# Patient Record
Sex: Male | Born: 1960 | Race: White | Hispanic: No | Marital: Married | Smoking: Current every day smoker
Health system: Southern US, Community
[De-identification: ages and names within clinical notes are randomized; demographics above are authoritative.]

## PROBLEM LIST (undated history)

## (undated) DIAGNOSIS — I2510 Atherosclerotic heart disease of native coronary artery without angina pectoris: Secondary | ICD-10-CM

## (undated) DIAGNOSIS — G4730 Sleep apnea, unspecified: Secondary | ICD-10-CM

## (undated) DIAGNOSIS — E785 Hyperlipidemia, unspecified: Secondary | ICD-10-CM

## (undated) DIAGNOSIS — I639 Cerebral infarction, unspecified: Secondary | ICD-10-CM

## (undated) DIAGNOSIS — I10 Essential (primary) hypertension: Secondary | ICD-10-CM

## (undated) DIAGNOSIS — I213 ST elevation (STEMI) myocardial infarction of unspecified site: Secondary | ICD-10-CM

## (undated) DIAGNOSIS — I4891 Unspecified atrial fibrillation: Secondary | ICD-10-CM

## (undated) DIAGNOSIS — I82409 Acute embolism and thrombosis of unspecified deep veins of unspecified lower extremity: Secondary | ICD-10-CM

## (undated) HISTORY — DX: Atherosclerotic heart disease of native coronary artery without angina pectoris: I25.10

## (undated) HISTORY — DX: Unspecified atrial fibrillation: I48.91

## (undated) HISTORY — PX: CORONARY ARTERY BYPASS GRAFT: SHX141

## (undated) HISTORY — PX: NO PAST SURGERIES: SHX2092

---

## 2005-03-08 ENCOUNTER — Ambulatory Visit: Payer: Self-pay | Admitting: Internal Medicine

## 2005-03-10 ENCOUNTER — Observation Stay: Payer: Self-pay | Admitting: Internal Medicine

## 2005-03-17 ENCOUNTER — Ambulatory Visit: Payer: Self-pay | Admitting: Internal Medicine

## 2005-03-23 ENCOUNTER — Ambulatory Visit: Payer: Self-pay | Admitting: Internal Medicine

## 2005-04-13 ENCOUNTER — Ambulatory Visit: Payer: Self-pay | Admitting: Internal Medicine

## 2005-04-22 ENCOUNTER — Ambulatory Visit: Payer: Self-pay | Admitting: Internal Medicine

## 2005-05-23 ENCOUNTER — Ambulatory Visit: Payer: Self-pay | Admitting: Internal Medicine

## 2005-06-22 ENCOUNTER — Ambulatory Visit: Payer: Self-pay | Admitting: Internal Medicine

## 2005-07-24 ENCOUNTER — Ambulatory Visit: Payer: Self-pay | Admitting: Internal Medicine

## 2005-08-23 ENCOUNTER — Ambulatory Visit: Payer: Self-pay | Admitting: Internal Medicine

## 2005-09-20 ENCOUNTER — Ambulatory Visit: Payer: Self-pay | Admitting: Internal Medicine

## 2005-10-21 ENCOUNTER — Ambulatory Visit: Payer: Self-pay | Admitting: Internal Medicine

## 2005-11-20 ENCOUNTER — Ambulatory Visit: Payer: Self-pay | Admitting: Internal Medicine

## 2005-12-21 ENCOUNTER — Ambulatory Visit: Payer: Self-pay | Admitting: Internal Medicine

## 2006-01-20 ENCOUNTER — Ambulatory Visit: Payer: Self-pay | Admitting: Internal Medicine

## 2006-02-20 ENCOUNTER — Ambulatory Visit: Payer: Self-pay | Admitting: Internal Medicine

## 2006-03-23 ENCOUNTER — Ambulatory Visit: Payer: Self-pay | Admitting: Internal Medicine

## 2006-04-22 ENCOUNTER — Ambulatory Visit: Payer: Self-pay | Admitting: Internal Medicine

## 2006-05-23 ENCOUNTER — Ambulatory Visit: Payer: Self-pay | Admitting: Internal Medicine

## 2006-06-22 ENCOUNTER — Ambulatory Visit: Payer: Self-pay | Admitting: Internal Medicine

## 2006-07-23 ENCOUNTER — Ambulatory Visit: Payer: Self-pay | Admitting: Internal Medicine

## 2006-08-23 ENCOUNTER — Ambulatory Visit: Payer: Self-pay | Admitting: Internal Medicine

## 2006-09-18 IMAGING — CR DG CHEST 2V
1 series · 2 of 2 positions shown · non-contrast
Comparison: none

REASON FOR EXAM: DVT, smoker
COMMENTS:

[Series 1121: postero_anterior · 0.11mm/px · 2 of 2 slices shown]
[im 1/2]
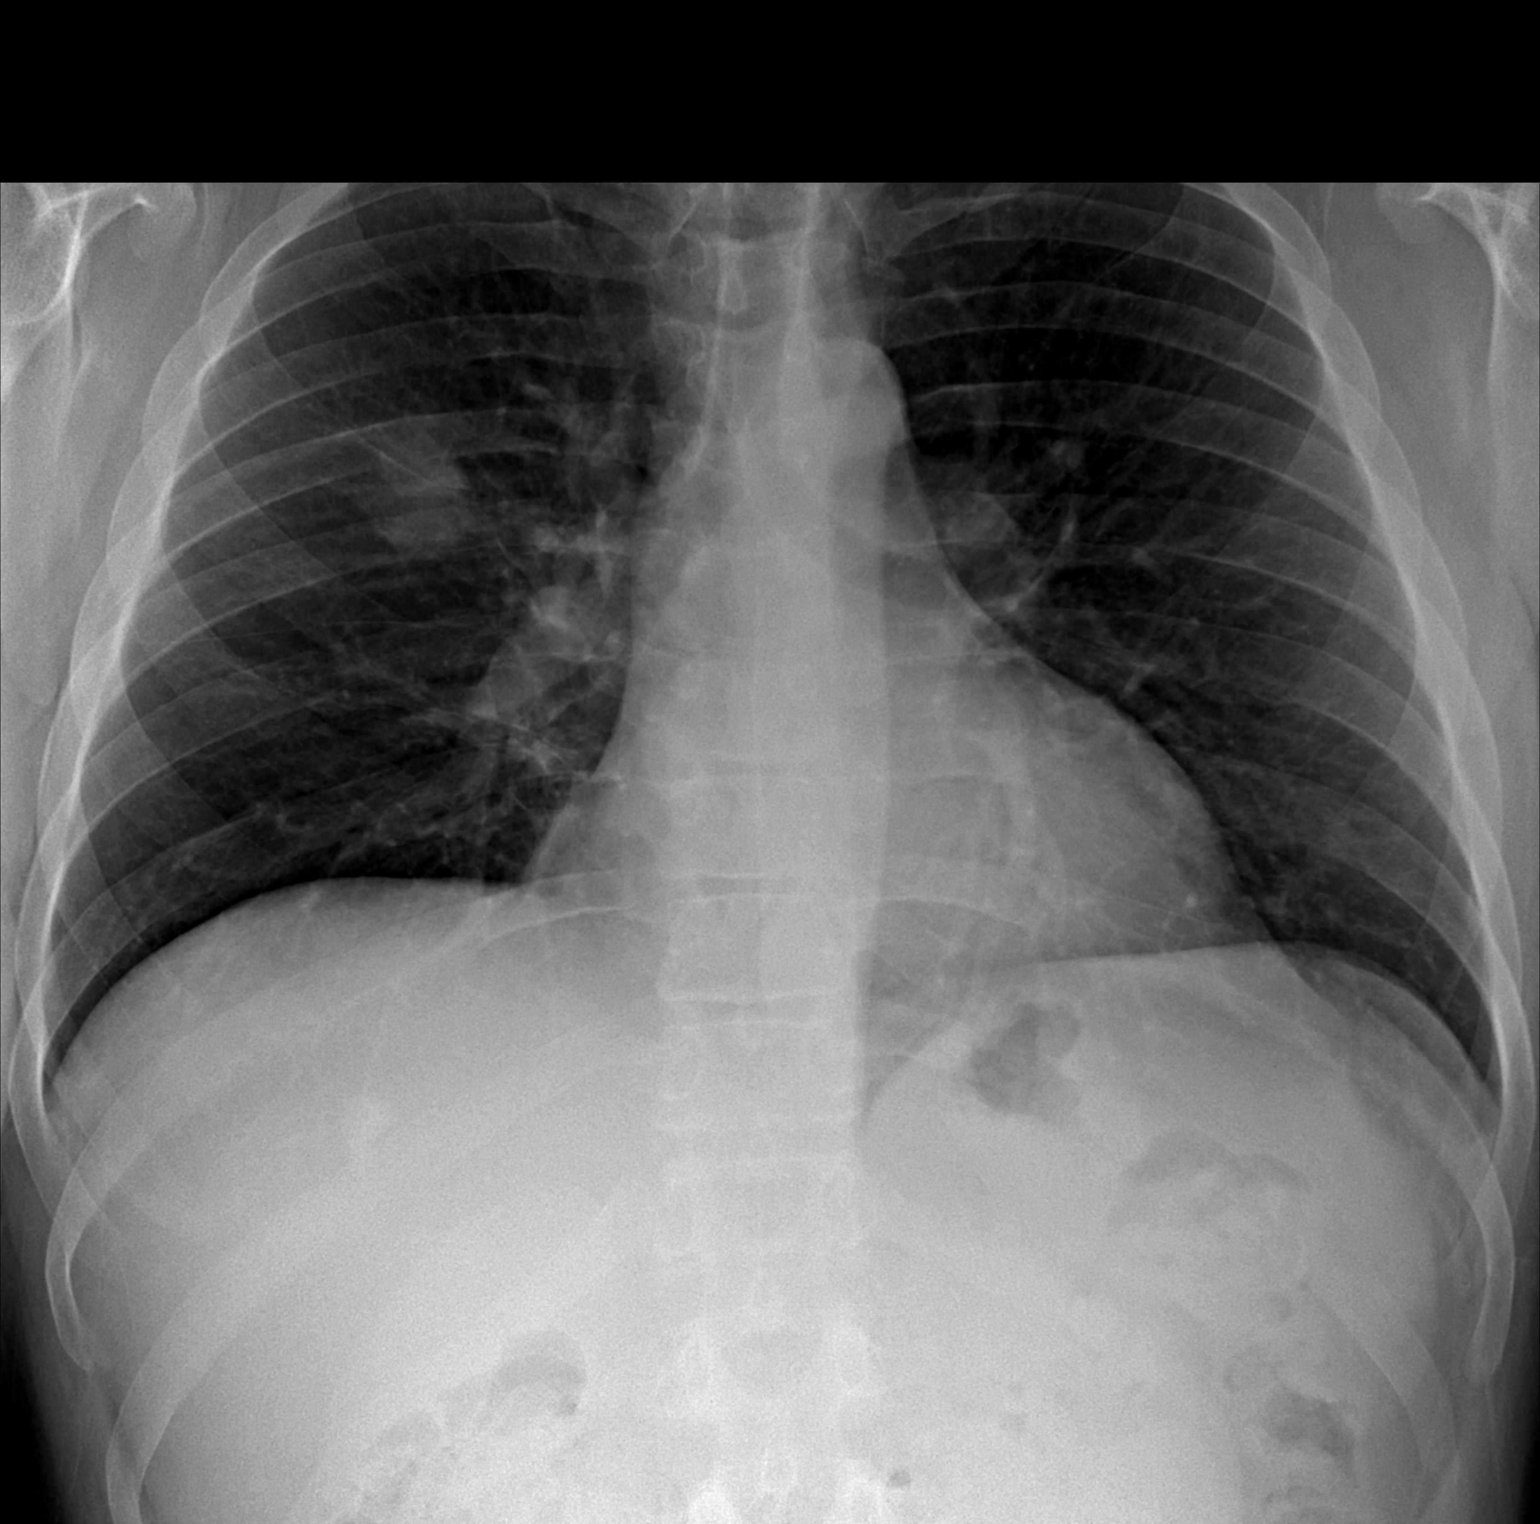
[im 2/2]
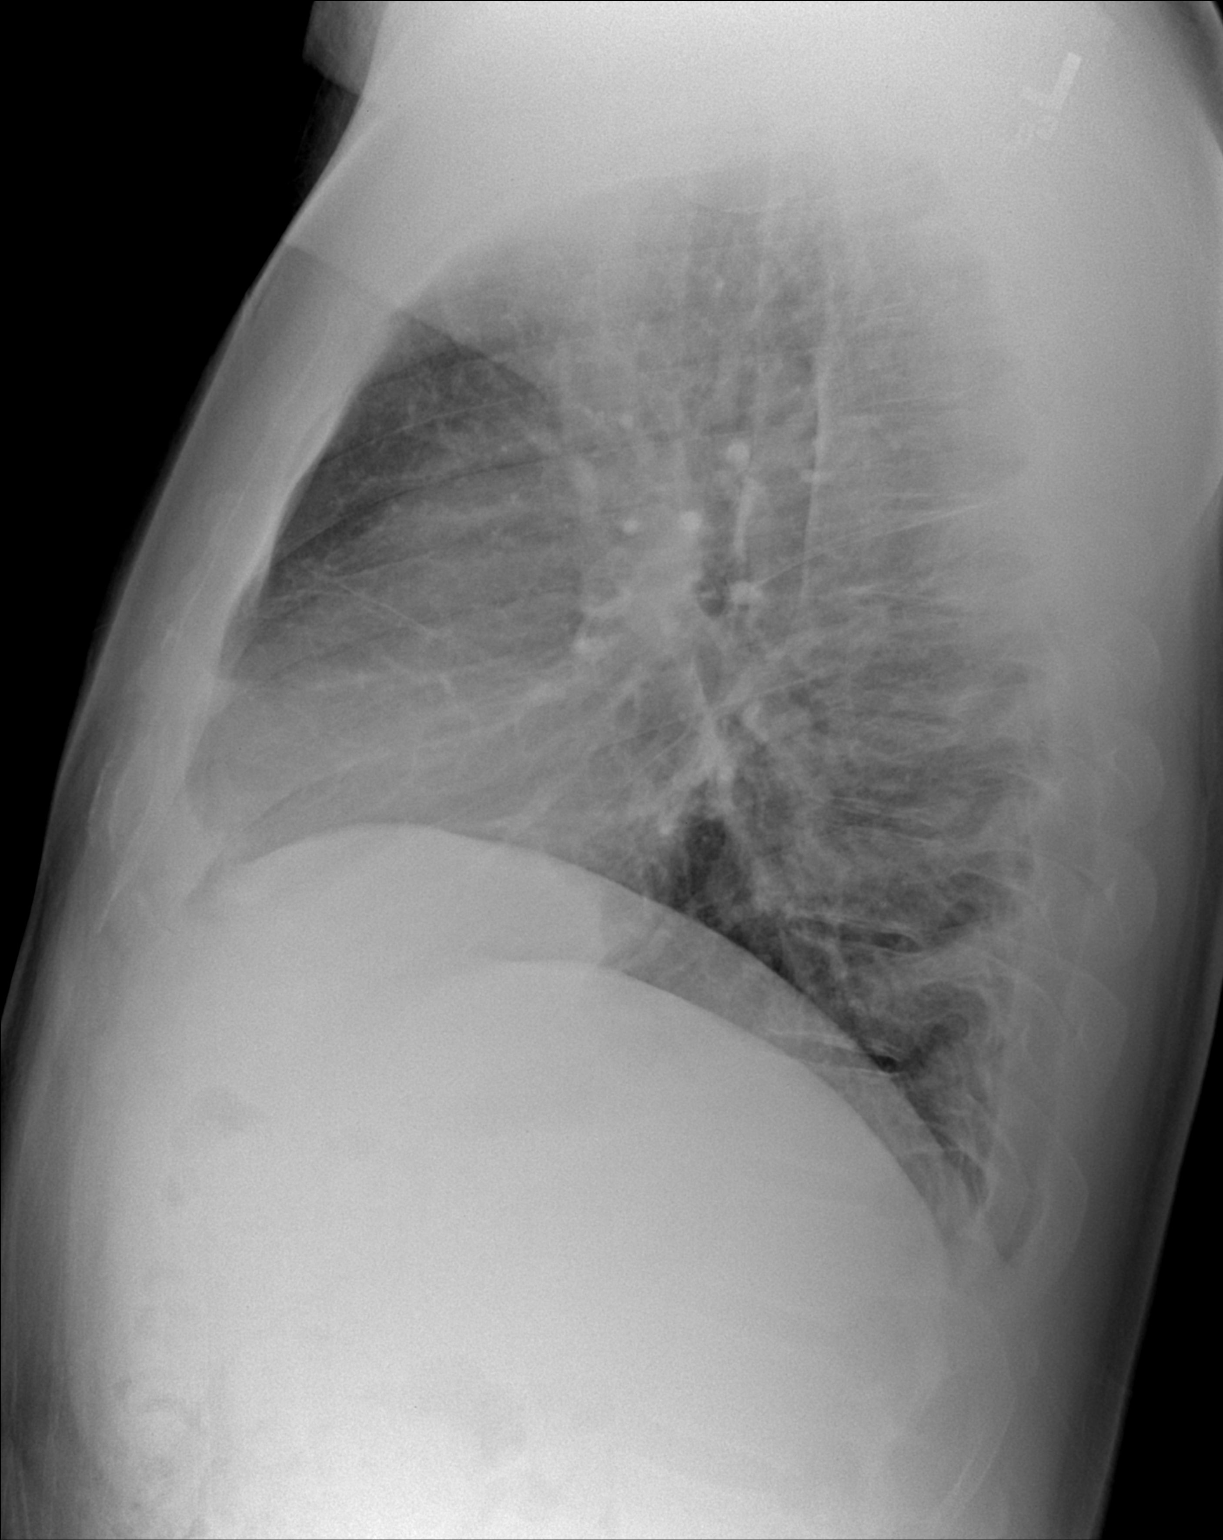

[2 of 2 positions shown; findings below may reference images not displayed]

PROCEDURE:     DXR - DXR CHEST PA (OR AP) AND LATERAL  - March 12, 2005 [DATE]

RESULT:     There is an area of abnormal density in the RIGHT midlung.  This
is difficult to demonstrate clearly on the lateral film but it may lie
posteriorly.  The lungs are mildly hypoinflated.  The heart is not enlarged.
 No acute bony abnormality is seen.
IMPRESSION: There is abnormal soft tissue density in the RIGHT midlung that appears to
reflect an area of parenchymal consolidation.  A repeat PA and lateral chest
x-ray with deep inspiration would be of value.  Chest CT could be performed
as well.

## 2006-09-18 IMAGING — US ABDOMEN ULTRASOUND
1 series · 14 of 25 positions shown · non-contrast
Comparison: none

REASON FOR EXAM: Polycythemia
COMMENTS:

[Series 1: abdomen ultrasound · 0.35mm/px · 14 of 69 slices shown]
[im 1/69]
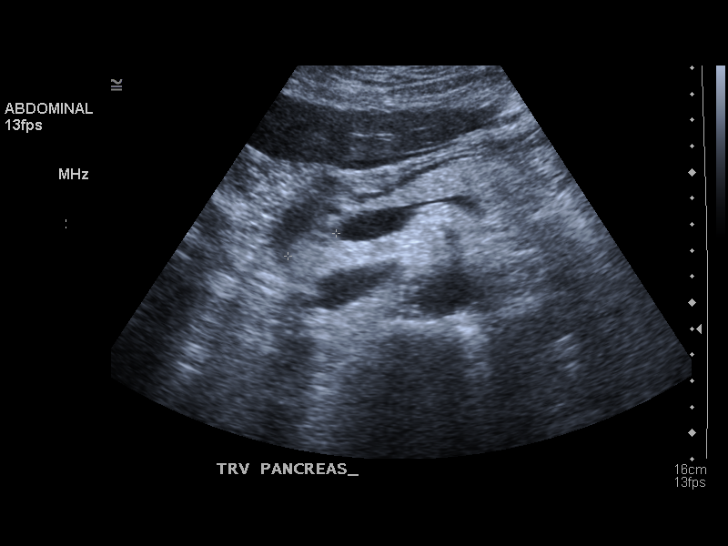
[im 6/69]
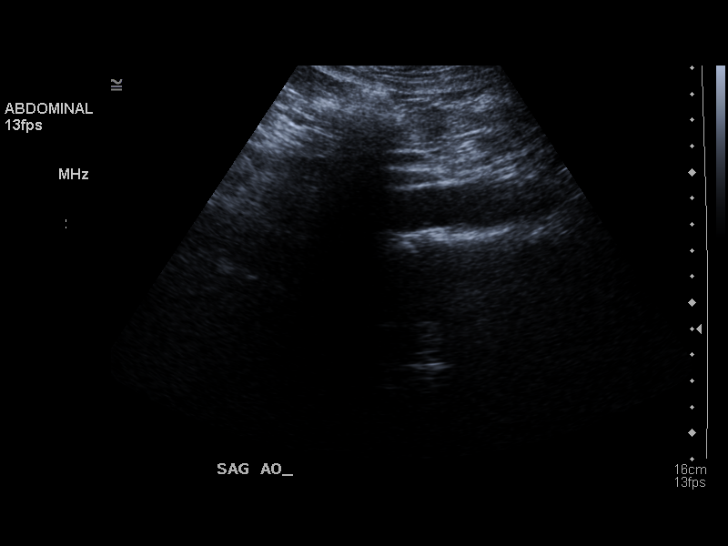
[im 12/69]
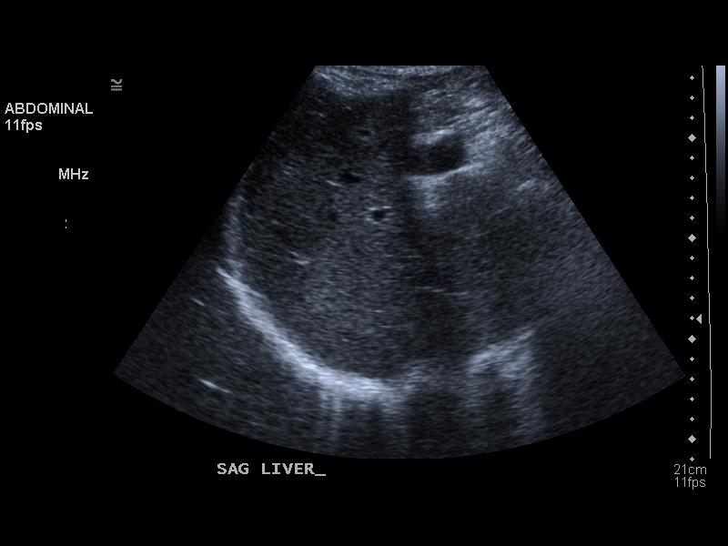
[im 18/69]
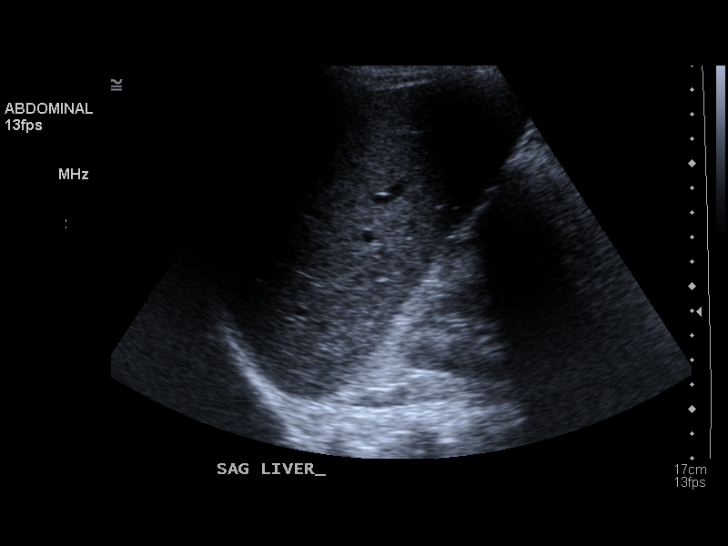
[im 23/69]
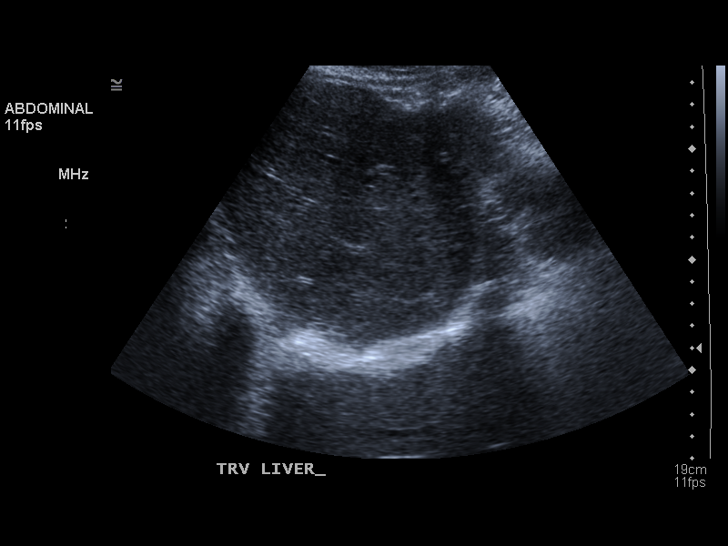
[im 26/69]
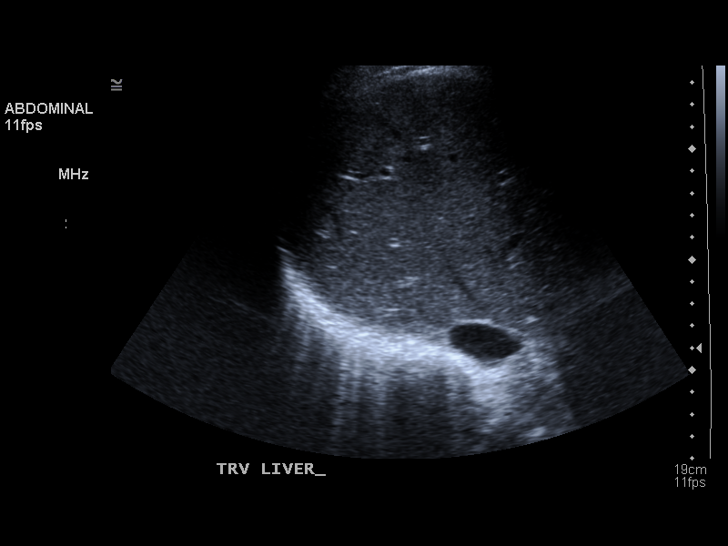
[im 32/69]
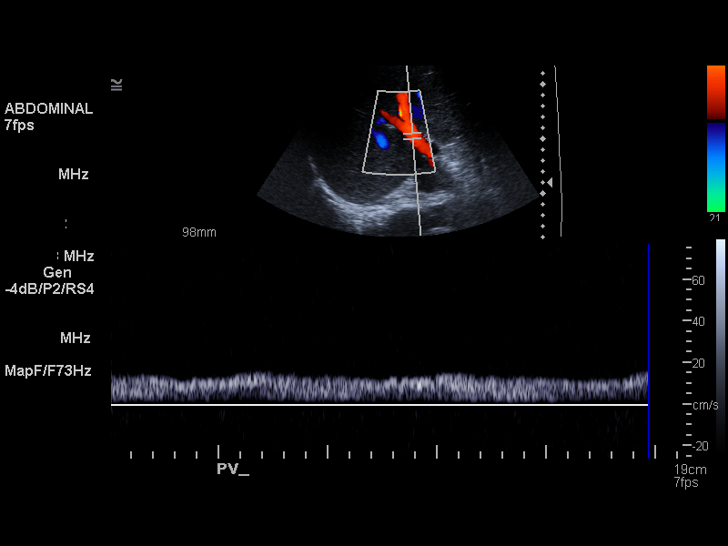
[im 37/69]
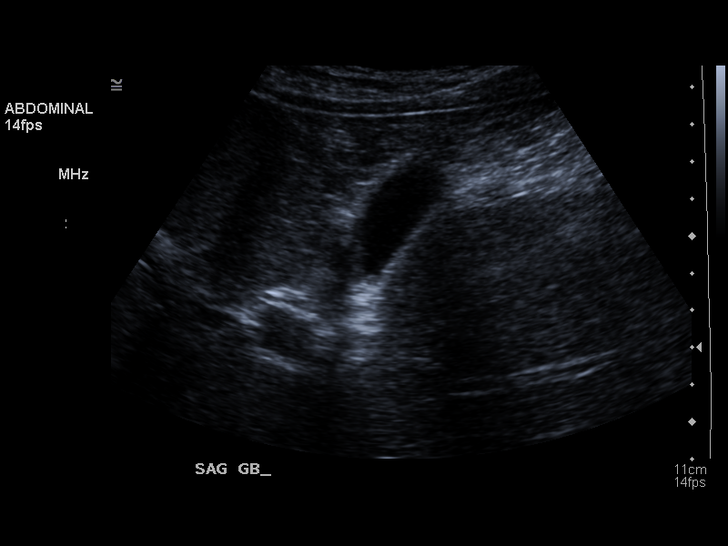
[im 43/69]
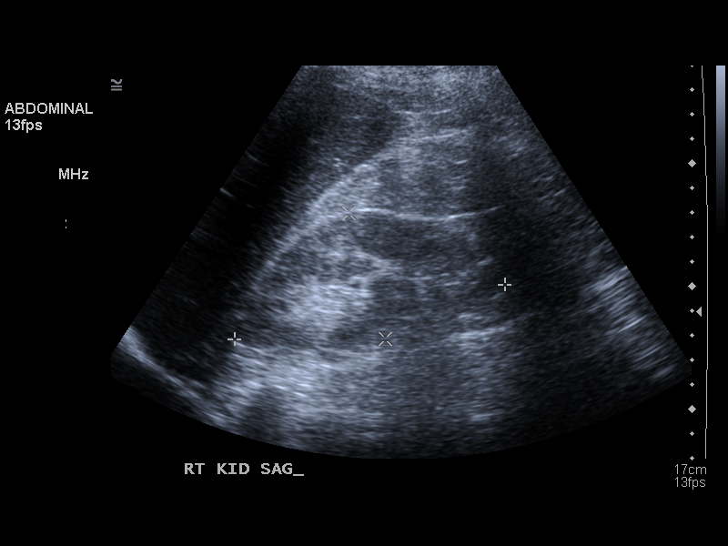
[im 46/69]
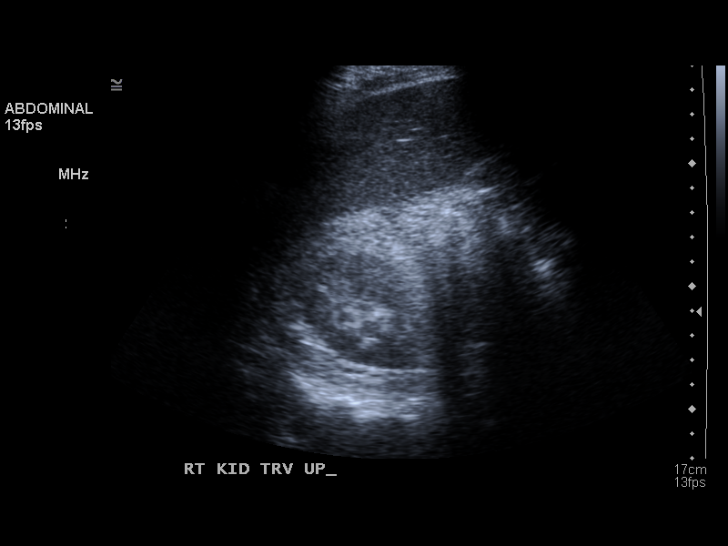
[im 52/69]
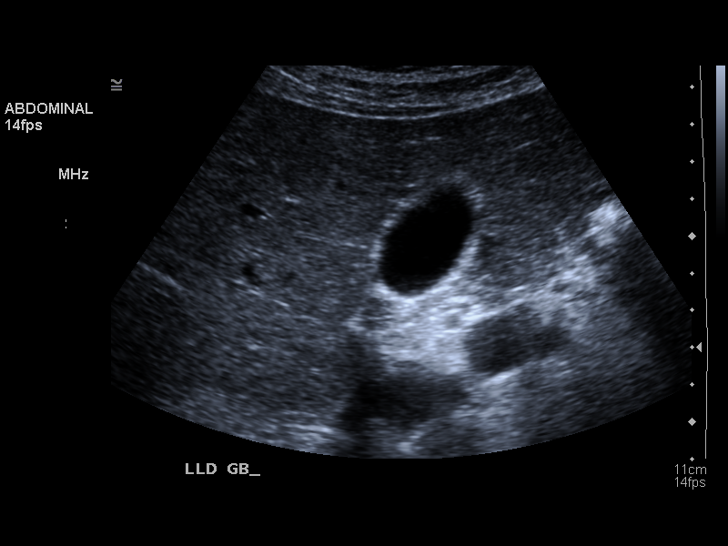
[im 57/69]
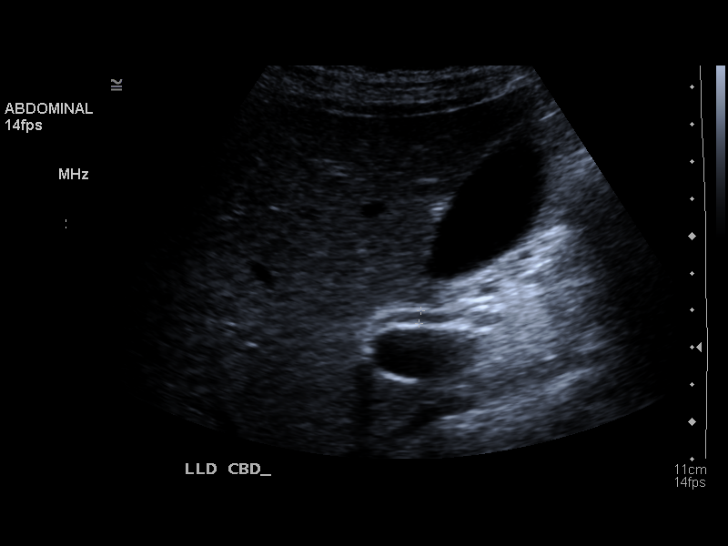
[im 63/69]
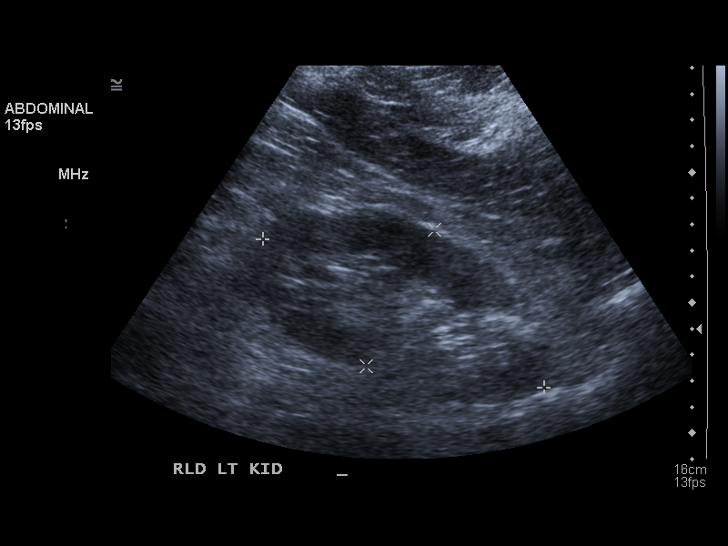
[im 69/69]
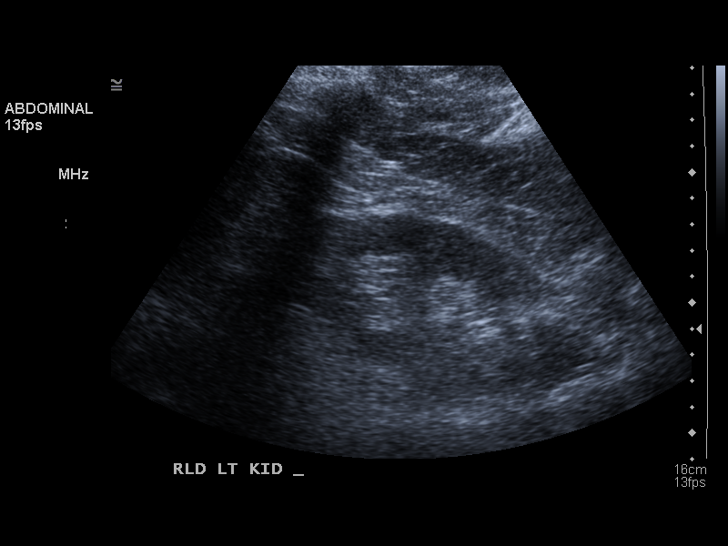

[14 of 25 positions shown; findings below may reference images not displayed]

PROCEDURE:     US  - US ABDOMEN GENERAL SURVEY  - March 12, 2005 [DATE]

RESULT:     The patient has unexplained polycythemia.  The liver exhibits
normal echotexture with no mass or ductal dilation.  The portal venous flow
is normal in direction.  The pancreas exhibits no mass or ductal dilation or
evidence of pseudocyst.  The gallbladder is adequately distended.  There is
no sonographic Murphy's sign.  A tiny adherent echogenic focus suggests a
gallbladder polyp or noncalcified non-shadowing stone.  The common bile duct
is normal at 3.5 mm in diameter.  The spleen is normal in echotexture and
measures approximately 10.2 cm in greatest dimension.  There is no evidence
of ascites.  Survey views of the abdominal aorta are normal.  The kidneys
exhibit normal size and echotexture with no evidence of obstruction.
IMPRESSION: There is a subtle echogenic focus adherent to the gallbladder wall that does
not shadow that may reflect a noncalcified stone or polyp.  Otherwise the
gallbladder appears normal.

I see no acute abnormality elsewhere within the abdomen.

## 2006-09-21 ENCOUNTER — Ambulatory Visit: Payer: Self-pay | Admitting: Internal Medicine

## 2006-10-22 ENCOUNTER — Ambulatory Visit: Payer: Self-pay | Admitting: Internal Medicine

## 2006-11-21 ENCOUNTER — Ambulatory Visit: Payer: Self-pay | Admitting: Internal Medicine

## 2006-12-22 ENCOUNTER — Ambulatory Visit: Payer: Self-pay | Admitting: Internal Medicine

## 2007-01-21 ENCOUNTER — Ambulatory Visit: Payer: Self-pay | Admitting: Internal Medicine

## 2007-02-21 ENCOUNTER — Ambulatory Visit: Payer: Self-pay | Admitting: Internal Medicine

## 2007-03-24 ENCOUNTER — Ambulatory Visit: Payer: Self-pay | Admitting: Internal Medicine

## 2007-04-23 ENCOUNTER — Ambulatory Visit: Payer: Self-pay | Admitting: Internal Medicine

## 2007-05-24 ENCOUNTER — Ambulatory Visit: Payer: Self-pay | Admitting: Internal Medicine

## 2007-06-23 ENCOUNTER — Ambulatory Visit: Payer: Self-pay | Admitting: Internal Medicine

## 2007-07-24 ENCOUNTER — Ambulatory Visit: Payer: Self-pay | Admitting: Internal Medicine

## 2007-08-24 ENCOUNTER — Ambulatory Visit: Payer: Self-pay | Admitting: Internal Medicine

## 2007-09-21 ENCOUNTER — Ambulatory Visit: Payer: Self-pay | Admitting: Internal Medicine

## 2007-10-22 ENCOUNTER — Ambulatory Visit: Payer: Self-pay | Admitting: Internal Medicine

## 2007-10-28 ENCOUNTER — Ambulatory Visit: Payer: Self-pay | Admitting: Internal Medicine

## 2007-11-21 ENCOUNTER — Ambulatory Visit: Payer: Self-pay | Admitting: Internal Medicine

## 2007-12-23 ENCOUNTER — Ambulatory Visit: Payer: Self-pay | Admitting: Internal Medicine

## 2008-01-21 ENCOUNTER — Ambulatory Visit: Payer: Self-pay | Admitting: Internal Medicine

## 2008-02-21 ENCOUNTER — Ambulatory Visit: Payer: Self-pay | Admitting: Internal Medicine

## 2008-03-23 ENCOUNTER — Ambulatory Visit: Payer: Self-pay | Admitting: Internal Medicine

## 2008-04-22 ENCOUNTER — Ambulatory Visit: Payer: Self-pay | Admitting: Internal Medicine

## 2008-05-23 ENCOUNTER — Ambulatory Visit: Payer: Self-pay | Admitting: Internal Medicine

## 2008-07-13 ENCOUNTER — Ambulatory Visit: Payer: Self-pay | Admitting: Internal Medicine

## 2008-07-23 ENCOUNTER — Ambulatory Visit: Payer: Self-pay | Admitting: Internal Medicine

## 2008-08-23 ENCOUNTER — Ambulatory Visit: Payer: Self-pay | Admitting: Internal Medicine

## 2008-09-20 ENCOUNTER — Ambulatory Visit: Payer: Self-pay | Admitting: Internal Medicine

## 2008-10-21 ENCOUNTER — Ambulatory Visit: Payer: Self-pay | Admitting: Internal Medicine

## 2008-11-20 ENCOUNTER — Ambulatory Visit: Payer: Self-pay | Admitting: Internal Medicine

## 2008-12-21 ENCOUNTER — Ambulatory Visit: Payer: Self-pay | Admitting: Internal Medicine

## 2009-01-20 ENCOUNTER — Ambulatory Visit: Payer: Self-pay | Admitting: Internal Medicine

## 2009-02-20 ENCOUNTER — Ambulatory Visit: Payer: Self-pay | Admitting: Internal Medicine

## 2009-04-22 ENCOUNTER — Ambulatory Visit: Payer: Self-pay | Admitting: Internal Medicine

## 2009-05-09 ENCOUNTER — Ambulatory Visit: Payer: Self-pay | Admitting: Internal Medicine

## 2009-05-23 ENCOUNTER — Ambulatory Visit: Payer: Self-pay | Admitting: Internal Medicine

## 2009-06-22 ENCOUNTER — Ambulatory Visit: Payer: Self-pay | Admitting: Internal Medicine

## 2009-07-23 ENCOUNTER — Ambulatory Visit: Payer: Self-pay | Admitting: Internal Medicine

## 2009-08-29 ENCOUNTER — Ambulatory Visit: Payer: Self-pay | Admitting: Internal Medicine

## 2009-09-20 ENCOUNTER — Ambulatory Visit: Payer: Self-pay | Admitting: Internal Medicine

## 2009-10-21 ENCOUNTER — Ambulatory Visit: Payer: Self-pay | Admitting: Internal Medicine

## 2009-11-20 ENCOUNTER — Ambulatory Visit: Payer: Self-pay | Admitting: Internal Medicine

## 2009-12-21 ENCOUNTER — Ambulatory Visit: Payer: Self-pay | Admitting: Internal Medicine

## 2010-01-20 ENCOUNTER — Ambulatory Visit: Payer: Self-pay | Admitting: Internal Medicine

## 2010-02-09 ENCOUNTER — Ambulatory Visit: Payer: Self-pay | Admitting: Internal Medicine

## 2010-02-20 ENCOUNTER — Ambulatory Visit: Payer: Self-pay | Admitting: Internal Medicine

## 2010-02-27 ENCOUNTER — Ambulatory Visit: Payer: Self-pay | Admitting: Internal Medicine

## 2010-03-23 ENCOUNTER — Ambulatory Visit: Payer: Self-pay | Admitting: Internal Medicine

## 2010-07-14 ENCOUNTER — Ambulatory Visit: Payer: Self-pay | Admitting: Internal Medicine

## 2010-07-23 ENCOUNTER — Ambulatory Visit: Payer: Self-pay | Admitting: Internal Medicine

## 2010-08-23 ENCOUNTER — Ambulatory Visit: Payer: Self-pay | Admitting: Internal Medicine

## 2010-10-06 ENCOUNTER — Ambulatory Visit: Payer: Self-pay | Admitting: Internal Medicine

## 2010-10-22 ENCOUNTER — Ambulatory Visit: Payer: Self-pay | Admitting: Internal Medicine

## 2010-11-21 ENCOUNTER — Ambulatory Visit: Payer: Self-pay | Admitting: Internal Medicine

## 2010-12-22 ENCOUNTER — Ambulatory Visit: Payer: Self-pay | Admitting: Internal Medicine

## 2011-01-21 ENCOUNTER — Ambulatory Visit: Payer: Self-pay | Admitting: Internal Medicine

## 2011-02-21 ENCOUNTER — Ambulatory Visit: Payer: Self-pay | Admitting: Internal Medicine

## 2011-03-24 ENCOUNTER — Ambulatory Visit: Payer: Self-pay | Admitting: Internal Medicine

## 2011-04-23 ENCOUNTER — Ambulatory Visit: Payer: Self-pay | Admitting: Internal Medicine

## 2011-06-06 ENCOUNTER — Ambulatory Visit: Payer: Self-pay | Admitting: Internal Medicine

## 2011-06-23 ENCOUNTER — Ambulatory Visit: Payer: Self-pay | Admitting: Internal Medicine

## 2011-07-24 ENCOUNTER — Ambulatory Visit: Payer: Self-pay | Admitting: Internal Medicine

## 2011-08-02 LAB — PROTIME-INR: INR: 1.7

## 2011-08-06 LAB — PROTIME-INR
INR: 2.2
Prothrombin Time: 24.8 secs — ABNORMAL HIGH (ref 11.5–14.7)

## 2011-08-06 LAB — CANCER CENTER HEMATOCRIT: HCT: 39.9 % — ABNORMAL LOW (ref 40.0–52.0)

## 2011-08-16 LAB — PROTIME-INR
INR: 2.7
Prothrombin Time: 28.6 secs — ABNORMAL HIGH (ref 11.5–14.7)

## 2011-08-24 ENCOUNTER — Ambulatory Visit: Payer: Self-pay | Admitting: Internal Medicine

## 2011-08-29 LAB — PROTIME-INR
INR: 2.3
Prothrombin Time: 25.3 secs — ABNORMAL HIGH (ref 11.5–14.7)

## 2011-09-05 IMAGING — US US EXTREM LOW VENOUS*R*
1 series · 17 of 24 positions shown · non-contrast
Comparison: none

REASON FOR EXAM: STAT CR [id number] R LEG SWELLING AND EDEMA
COMMENTS:

PROCEDURE:     US  - US DOPPLER LOW EXTR RIGHT  - February 27, 2010  [DATE]
RESULT:     Right lower extremity deep venous Doppler ultrasound dated
02/27/2010.

[Series 1: us extrem low venous*right* · 17 of 27 slices shown]
[im 1/27]
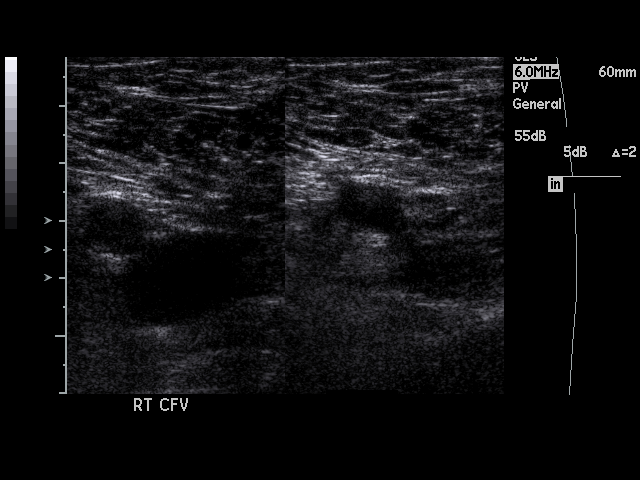
[im 3/27]
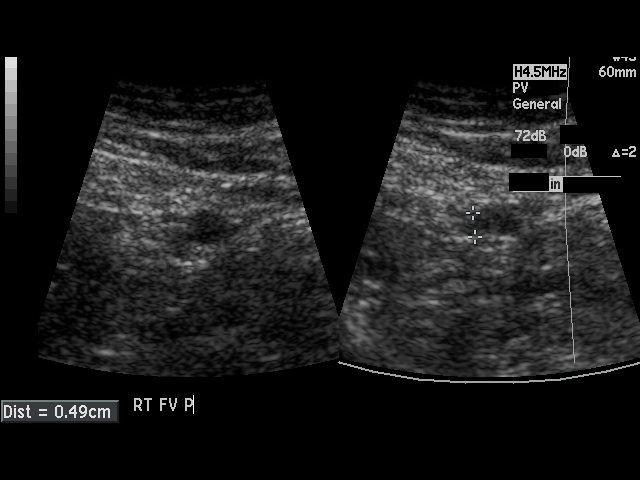
[im 4/27]
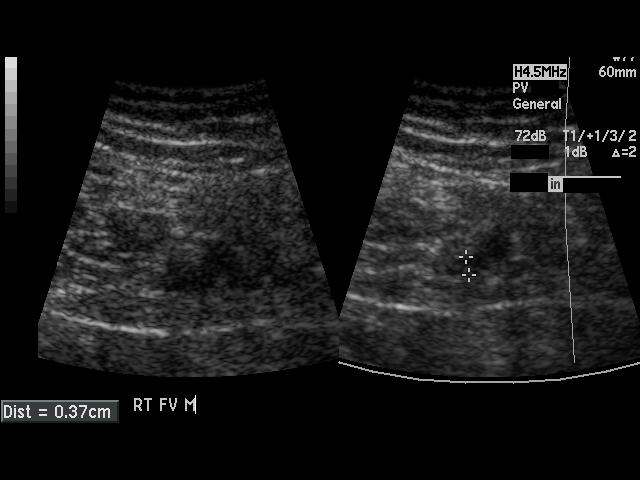
[im 5/27]
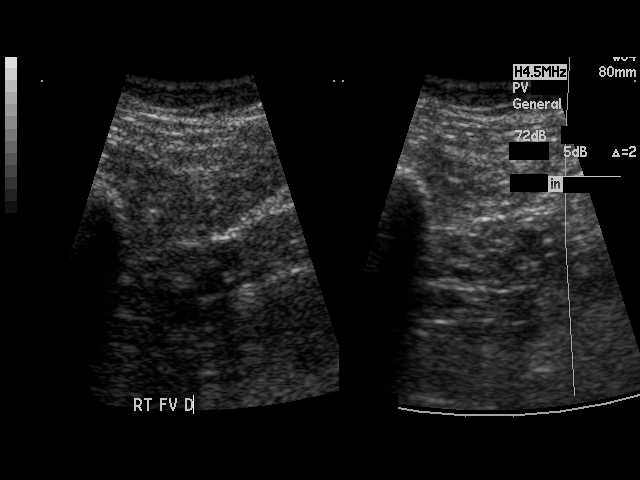
[im 7/27]
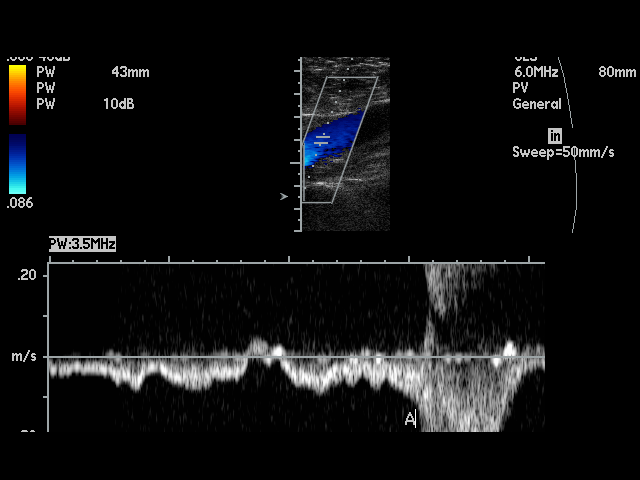
[im 8/27]
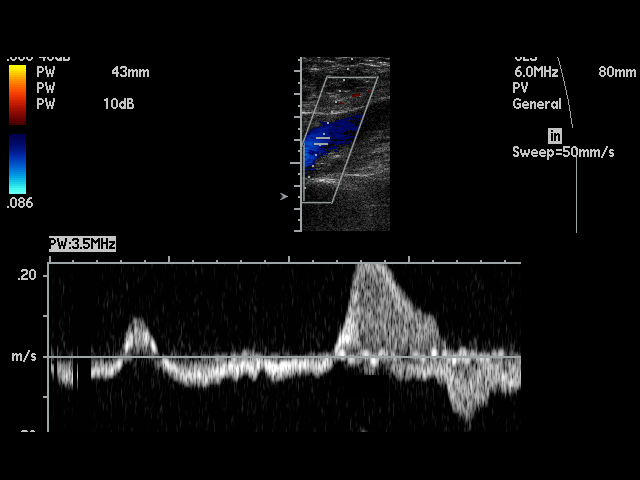
[im 11/27]
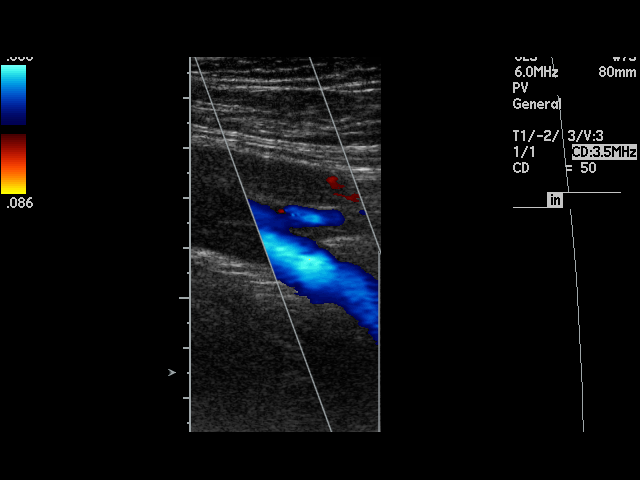
[im 12/27]
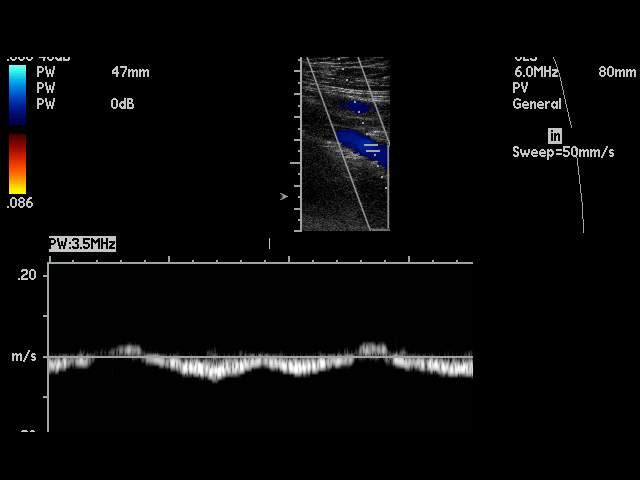
[im 14/27]
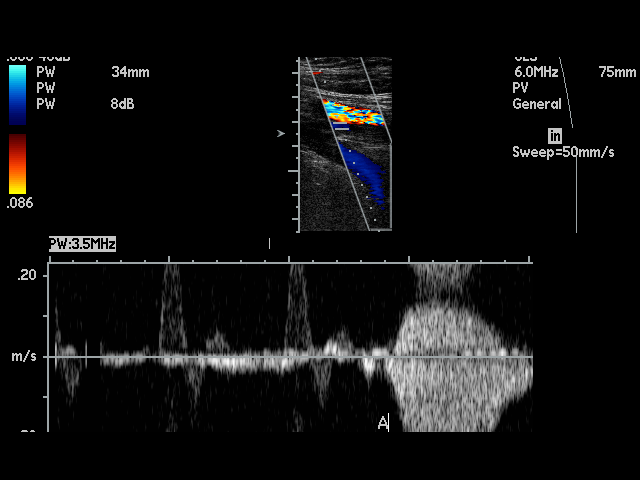
[im 15/27]
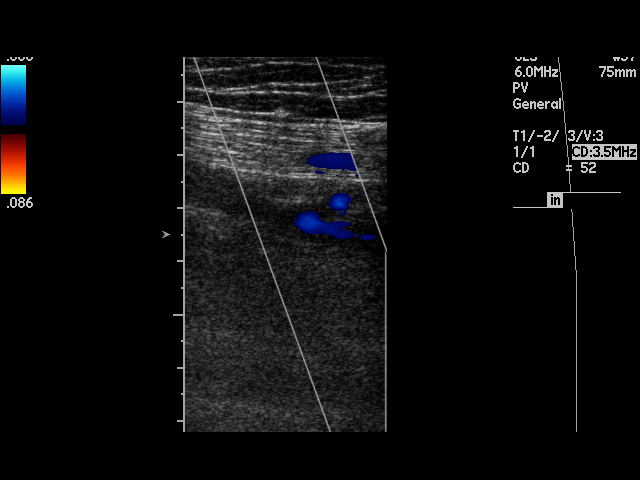
[im 16/27]
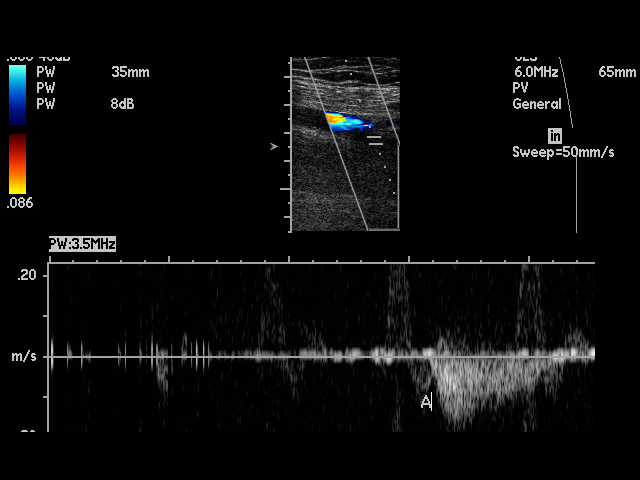
[im 19/27]
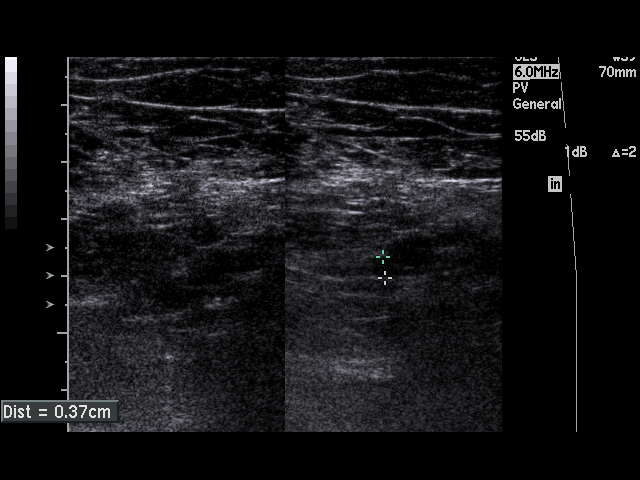
[im 20/27]
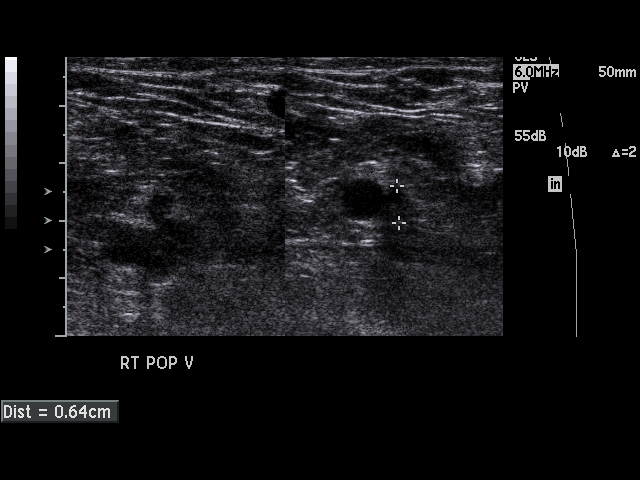
[im 22/27]
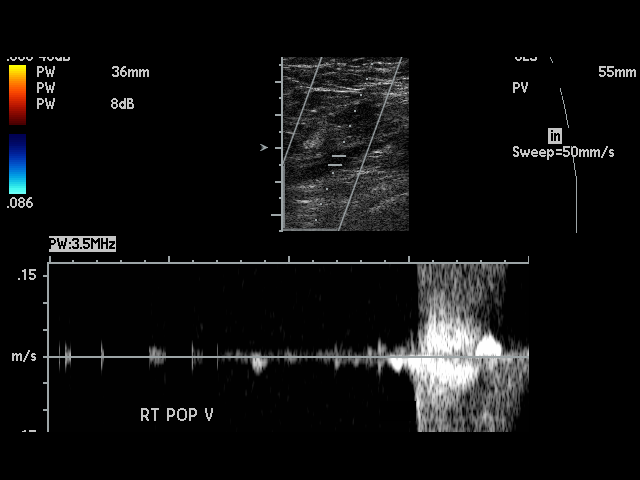
[im 23/27]
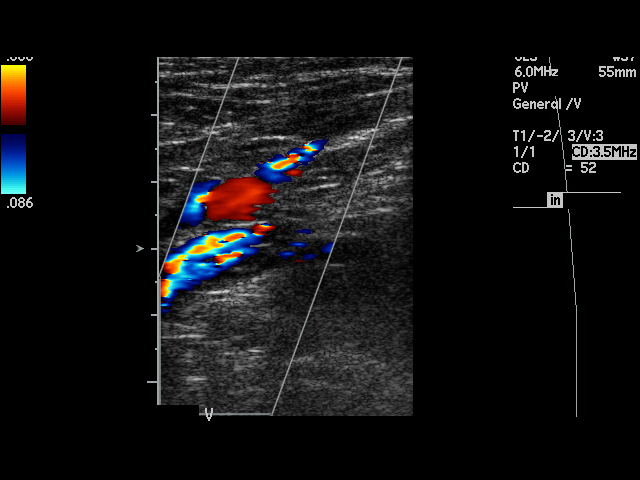
[im 24/27]
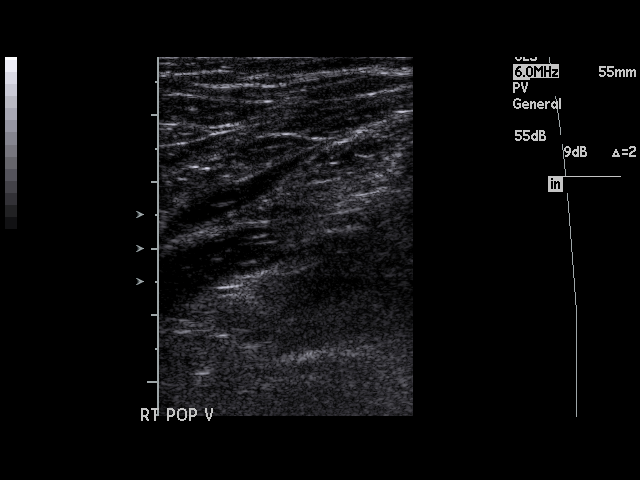
[im 27/27]
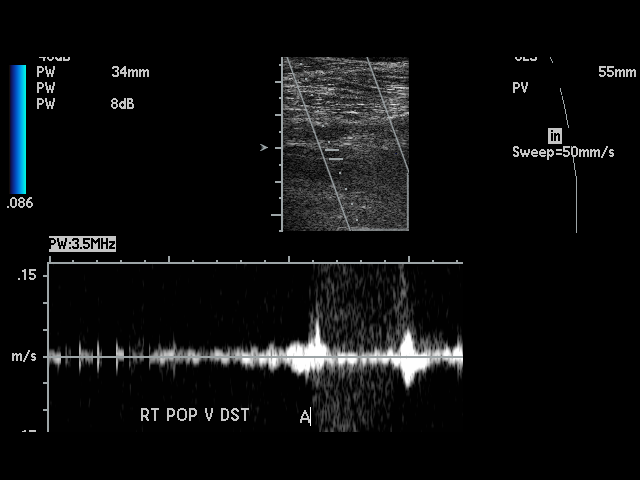

[17 of 24 positions shown; findings below may reference images not displayed]

FINDINGS: An area of asymmetric mural based increased echogenicity is
appreciated extending from the superficial femoral vein through the
popliteal vein. This region demonstrates partial compressibility. There is
evidence of color flow traversing this region. Spectral waveform imaging is
also appreciated.

The remaining interrogated vessels demonstrate no evidence of increased
echogenicity nor noncompressibility. There is appropriate response to
Valsalva and augmentation.
IMPRESSION: Nonocclusive mural thrombus appreciated extending from the
superficial femoral vein to and through the popliteal vein.

## 2011-09-21 ENCOUNTER — Ambulatory Visit: Payer: Self-pay | Admitting: Internal Medicine

## 2011-09-21 LAB — CANCER CENTER HEMATOCRIT: HCT: 45.2 % (ref 40.0–52.0)

## 2011-09-21 LAB — PROTIME-INR: INR: 1.7

## 2011-10-18 LAB — PROTIME-INR: Prothrombin Time: 31 secs — ABNORMAL HIGH (ref 11.5–14.7)

## 2011-10-22 ENCOUNTER — Ambulatory Visit: Payer: Self-pay | Admitting: Internal Medicine

## 2016-04-19 DIAGNOSIS — Z86718 Personal history of other venous thrombosis and embolism: Secondary | ICD-10-CM | POA: Insufficient documentation

## 2016-04-26 ENCOUNTER — Encounter
Admission: RE | Disposition: A | Discharge status: Home / Self Care | Payer: Self-pay | Source: Ambulatory Visit | Attending: Internal Medicine

## 2016-04-26 ENCOUNTER — Ambulatory Visit
Admission: RE | Admit: 2016-04-26 | Discharge: 2016-04-26 | Disposition: A | Discharge status: Home / Self Care | Payer: BC Managed Care – PPO | Source: Ambulatory Visit | Attending: Internal Medicine | Admitting: Internal Medicine

## 2016-04-26 ENCOUNTER — Encounter: Payer: Self-pay | Admitting: *Deleted

## 2016-04-26 DIAGNOSIS — I252 Old myocardial infarction: Secondary | ICD-10-CM | POA: Insufficient documentation

## 2016-04-26 DIAGNOSIS — I10 Essential (primary) hypertension: Secondary | ICD-10-CM | POA: Insufficient documentation

## 2016-04-26 DIAGNOSIS — Z6837 Body mass index (BMI) 37.0-37.9, adult: Secondary | ICD-10-CM | POA: Diagnosis not present

## 2016-04-26 DIAGNOSIS — E669 Obesity, unspecified: Secondary | ICD-10-CM | POA: Diagnosis not present

## 2016-04-26 DIAGNOSIS — I25118 Atherosclerotic heart disease of native coronary artery with other forms of angina pectoris: Secondary | ICD-10-CM | POA: Diagnosis not present

## 2016-04-26 DIAGNOSIS — Z79899 Other long term (current) drug therapy: Secondary | ICD-10-CM | POA: Diagnosis not present

## 2016-04-26 DIAGNOSIS — F17210 Nicotine dependence, cigarettes, uncomplicated: Secondary | ICD-10-CM | POA: Insufficient documentation

## 2016-04-26 DIAGNOSIS — I209 Angina pectoris, unspecified: Secondary | ICD-10-CM | POA: Diagnosis present

## 2016-04-26 DIAGNOSIS — Z8249 Family history of ischemic heart disease and other diseases of the circulatory system: Secondary | ICD-10-CM | POA: Insufficient documentation

## 2016-04-26 DIAGNOSIS — I208 Other forms of angina pectoris: Secondary | ICD-10-CM | POA: Diagnosis present

## 2016-04-26 DIAGNOSIS — I214 Non-ST elevation (NSTEMI) myocardial infarction: Secondary | ICD-10-CM | POA: Insufficient documentation

## 2016-04-26 DIAGNOSIS — Z7982 Long term (current) use of aspirin: Secondary | ICD-10-CM | POA: Diagnosis not present

## 2016-04-26 DIAGNOSIS — E782 Mixed hyperlipidemia: Secondary | ICD-10-CM | POA: Diagnosis not present

## 2016-04-26 DIAGNOSIS — I2089 Other forms of angina pectoris: Secondary | ICD-10-CM | POA: Diagnosis present

## 2016-04-26 HISTORY — DX: Hyperlipidemia, unspecified: E78.5

## 2016-04-26 HISTORY — PX: CARDIAC CATHETERIZATION: SHX172

## 2016-04-26 SURGERY — LEFT HEART CATH AND CORONARY ANGIOGRAPHY
Anesthesia: Moderate Sedation | Laterality: Left

## 2016-04-26 MED ORDER — SODIUM CHLORIDE 0.9 % WEIGHT BASED INFUSION: 3.0000 mL/kg/h | INTRAVENOUS | Status: DC

## 2016-04-26 MED ORDER — ACETAMINOPHEN 325 MG PO TABS: 650.0000 mg | ORAL_TABLET | ORAL | Status: DC | PRN

## 2016-04-26 MED ORDER — SODIUM CHLORIDE 0.9 % WEIGHT BASED INFUSION: 1.0000 mL/kg/h | INTRAVENOUS | Status: DC

## 2016-04-26 MED ORDER — IOPAMIDOL (ISOVUE-300) INJECTION 61%
INTRAVENOUS | Status: DC | PRN
  Administered 2016-04-26: 305 mL via INTRA_ARTERIAL

## 2016-04-26 MED ORDER — FENTANYL CITRATE (PF) 100 MCG/2ML IJ SOLN
INTRAMUSCULAR | Status: DC | PRN
  Administered 2016-04-26: 50 ug via INTRAVENOUS

## 2016-04-26 MED ORDER — ATORVASTATIN CALCIUM 80 MG PO TABS
80.0000 mg | ORAL_TABLET | Freq: Every day | ORAL | 4 refills | Status: AC
Start: 2016-04-26 — End: ?

## 2016-04-26 MED ORDER — ASPIRIN 81 MG PO CHEW: 81.0000 mg | CHEWABLE_TABLET | ORAL | Status: DC

## 2016-04-26 MED ORDER — SODIUM CHLORIDE 0.9% FLUSH: 3.0000 mL | Freq: Two times a day (BID) | INTRAVENOUS | Status: DC

## 2016-04-26 MED ORDER — SODIUM CHLORIDE 0.9% FLUSH: 3.0000 mL | INTRAVENOUS | Status: DC | PRN

## 2016-04-26 MED ORDER — MIDAZOLAM HCL 2 MG/2ML IJ SOLN
INTRAMUSCULAR | Status: AC
  Filled 2016-04-26: qty 2

## 2016-04-26 MED ORDER — HEPARIN (PORCINE) IN NACL 2-0.9 UNIT/ML-% IJ SOLN
INTRAMUSCULAR | Status: AC
  Filled 2016-04-26: qty 500

## 2016-04-26 MED ORDER — ONDANSETRON HCL 4 MG/2ML IJ SOLN: 4.0000 mg | Freq: Four times a day (QID) | INTRAMUSCULAR | Status: DC | PRN

## 2016-04-26 MED ORDER — METOPROLOL SUCCINATE ER 25 MG PO TB24: 25.0000 mg | ORAL_TABLET | Freq: Every day | ORAL | 4 refills | Status: DC

## 2016-04-26 MED ORDER — FENTANYL CITRATE (PF) 100 MCG/2ML IJ SOLN
INTRAMUSCULAR | Status: AC
  Filled 2016-04-26: qty 2

## 2016-04-26 MED ORDER — SODIUM CHLORIDE 0.9 % IV SOLN: 250.0000 mL | INTRAVENOUS | Status: DC | PRN

## 2016-04-26 MED ORDER — MIDAZOLAM HCL 2 MG/2ML IJ SOLN
INTRAMUSCULAR | Status: DC | PRN
  Administered 2016-04-26: 1 mg via INTRAVENOUS

## 2016-04-26 MED ORDER — CLOPIDOGREL BISULFATE 75 MG PO TABS: 75.0000 mg | ORAL_TABLET | Freq: Every day | ORAL | 4 refills | Status: DC

## 2016-04-26 SURGICAL SUPPLY — 12 items
CATH 5FR JR4 DIAGNOSTIC (CATHETERS) ×3 IMPLANT
CATH 5FR PIGTAIL DIAGNOSTIC (CATHETERS) ×2 IMPLANT
CATH INFINITI 5FR JL4 (CATHETERS) ×3 IMPLANT
CATH INFINITI 5FR JL5 (CATHETERS) ×3 IMPLANT
CATH VISTA GUIDE 6FR XB3.5 SH (CATHETERS) ×3 IMPLANT
DEVICE CLOSURE MYNXGRIP 6/7F (Vascular Products) ×3 IMPLANT
KIT MANI 3VAL PERCEP (MISCELLANEOUS) ×3 IMPLANT
NEEDLE PERC 18GX7CM (NEEDLE) ×3 IMPLANT
PACK CARDIAC CATH (CUSTOM PROCEDURE TRAY) ×3 IMPLANT
SHEATH AVANTI 6FR X 11CM (SHEATH) ×3 IMPLANT
SHEATH PINNACLE 5F 10CM (SHEATH) ×3 IMPLANT
WIRE EMERALD 3MM-J .035X150CM (WIRE) ×3 IMPLANT

## 2016-04-26 NOTE — Discharge Instructions (Signed)
Angiogram, Care After °Refer to this sheet in the next few weeks. These instructions provide you with information about caring for yourself after your procedure. Your health care provider may also give you more specific instructions. Your treatment has been planned according to current medical practices, but problems sometimes occur. Call your health care provider if you have any problems or questions after your procedure. °WHAT TO EXPECT AFTER THE PROCEDURE °After your procedure, it is typical to have the following: °· Bruising at the catheter insertion site that usually fades within 1-2 weeks. °· Blood collecting in the tissue (hematoma) that may be painful to the touch. It should usually decrease in size and tenderness within 1-2 weeks. °HOME CARE INSTRUCTIONS °· Take medicines only as directed by your health care provider. °· You may shower 24-48 hours after the procedure or as directed by your health care provider. Remove the bandage (dressing) and gently wash the site with plain soap and water. Pat the area dry with a clean towel. Do not rub the site, because this may cause bleeding. °· Do not take baths, swim, or use a hot tub until your health care provider approves. °· Check your insertion site every day for redness, swelling, or drainage. °· Do not apply powder or lotion to the site. °· Do not lift over 10 lb (4.5 kg) for 5 days after your procedure or as directed by your health care provider. °· Ask your health care provider when it is okay to: °¨ Return to work or school. °¨ Resume usual physical activities or sports. °¨ Resume sexual activity. °· Do not drive home if you are discharged the same day as the procedure. Have someone else drive you. °· You may drive 24 hours after the procedure unless otherwise instructed by your health care provider. °· Do not operate machinery or power tools for 24 hours after the procedure or as directed by your health care provider. °· If your procedure was done as an  outpatient procedure, which means that you went home the same day as your procedure, a responsible adult should be with you for the first 24 hours after you arrive home. °· Keep all follow-up visits as directed by your health care provider. This is important. °SEEK MEDICAL CARE IF: °· You have a fever. °· You have chills. °· You have increased bleeding from the catheter insertion site. Hold pressure on the site. °SEEK IMMEDIATE MEDICAL CARE IF: °· You have unusual pain at the catheter insertion site. °· You have redness, warmth, or swelling at the catheter insertion site. °· You have drainage (other than a small amount of blood on the dressing) from the catheter insertion site. °· The catheter insertion site is bleeding, and the bleeding does not stop after 30 minutes of holding steady pressure on the site. °· The area near or just beyond the catheter insertion site becomes pale, cool, tingly, or numb. °  °This information is not intended to replace advice given to you by your health care provider. Make sure you discuss any questions you have with your health care provider. °  °Document Released: 01/25/2005 Document Revised: 07/30/2014 Document Reviewed: 12/10/2012 °[employer] Interactive Patient Education ©2016 [employer] ° °

## 2016-05-02 ENCOUNTER — Encounter: Payer: Self-pay | Admitting: Internal Medicine

## 2016-06-05 ENCOUNTER — Ambulatory Visit
Admission: RE | Admit: 2016-06-05 | Discharge: 2016-06-05 | Disposition: A | Discharge status: Home / Self Care | Payer: BC Managed Care – PPO | Source: Ambulatory Visit | Attending: Physician Assistant | Admitting: Physician Assistant

## 2016-06-05 ENCOUNTER — Other Ambulatory Visit: Payer: Self-pay | Admitting: Physician Assistant

## 2016-06-05 DIAGNOSIS — M79604 Pain in right leg: Secondary | ICD-10-CM

## 2016-06-05 DIAGNOSIS — I82411 Acute embolism and thrombosis of right femoral vein: Secondary | ICD-10-CM | POA: Diagnosis not present

## 2016-06-05 DIAGNOSIS — R609 Edema, unspecified: Secondary | ICD-10-CM

## 2016-06-05 DIAGNOSIS — M7989 Other specified soft tissue disorders: Secondary | ICD-10-CM | POA: Diagnosis present

## 2016-06-11 DIAGNOSIS — I2510 Atherosclerotic heart disease of native coronary artery without angina pectoris: Secondary | ICD-10-CM | POA: Insufficient documentation

## 2016-07-30 ENCOUNTER — Encounter: Payer: BC Managed Care – PPO | Attending: Internal Medicine | Admitting: *Deleted

## 2016-07-30 VITALS — Ht 68.8 in | Wt 238.2 lb

## 2016-07-30 DIAGNOSIS — Z9889 Other specified postprocedural states: Secondary | ICD-10-CM | POA: Insufficient documentation

## 2016-07-30 DIAGNOSIS — Z9861 Coronary angioplasty status: Secondary | ICD-10-CM

## 2016-07-30 DIAGNOSIS — Z48812 Encounter for surgical aftercare following surgery on the circulatory system: Secondary | ICD-10-CM | POA: Insufficient documentation

## 2016-07-30 NOTE — Progress Notes (Signed)
Cardiac Individual Treatment Plan  Patient Details  Name: Shaun Anderson MRN: [id number] Date of Birth: Nov 23, 1960 Referring Provider:   Flowsheet Row Cardiac Rehab from 07/30/2016 in [facility] Cardiac and Pulmonary Rehab  Referring Provider  Serafina Royals MD      Initial Encounter Date:  Flowsheet Row Cardiac Rehab from 07/30/2016 in [facility] Cardiac and Pulmonary Rehab  Date  07/30/16  Referring Provider  Serafina Royals MD    07/30/2016  Visit Diagnosis: S/P PTCA (percutaneous transluminal coronary angioplasty)  Patient's Home Medications on Admission:  Current Outpatient Prescriptions:  .  aspirin EC 81 MG tablet, Take 81 mg by mouth daily., Disp: , Rfl:  .  atorvastatin (LIPITOR) 80 MG tablet, Take 1 tablet (80 mg total) by mouth daily., Disp: 90 tablet, Rfl: 4 .  clopidogrel (PLAVIX) 75 MG tablet, Take 1 tablet (75 mg total) by mouth daily., Disp: 90 tablet, Rfl: 4 .  isosorbide mononitrate (IMDUR) 30 MG 24 hr tablet, Take 30 mg by mouth daily., Disp: , Rfl:  .  metoprolol succinate (TOPROL XL) 25 MG 24 hr tablet, Take 1 tablet (25 mg total) by mouth daily., Disp: 90 tablet, Rfl: 4  Past Medical History: Past Medical History:  Diagnosis Date  . Hyperlipidemia     Tobacco Use: History  Smoking Status  . Current Every Day Smoker  . Packs/day: 1.00  . Types: Cigarettes  Smokeless Tobacco  . Never Used    Labs: Recent Review Flowsheet Data    There is no flowsheet data to display.       Exercise Target Goals: Date: 07/30/16  Exercise Program Goal: Individual exercise prescription set with THRR, safety & activity barriers. Participant demonstrates ability to understand and report RPE using BORG scale, to self-measure pulse accurately, and to acknowledge the importance of the exercise prescription.  Exercise Prescription Goal: Starting with aerobic activity 30 plus minutes a day, 3 days per week for initial exercise prescription. Provide home exercise prescription and  guidelines that participant acknowledges understanding prior to discharge.  Activity Barriers & Risk Stratification:     Activity Barriers & Cardiac Risk Stratification - 07/30/16 1242      Activity Barriers & Cardiac Risk Stratification   Activity Barriers None   Cardiac Risk Stratification High      6 Minute Walk:     6 Minute Walk    Row Name 07/30/16 1440         6 Minute Walk   Phase Initial     Distance 1540 feet     Walk Time 6 minutes     # of Rest Breaks 0     MPH 2.92     METS 3.82     RPE 8     VO2 Peak 13.37     Symptoms No     Resting HR 67 bpm     Resting BP 128/74     Max Ex. HR 110 bpm     Max Ex. BP 136/64     2 Minute Post BP 118/60        Initial Exercise Prescription:     Initial Exercise Prescription - 07/30/16 1400      Date of Initial Exercise RX and Referring Provider   Date 07/30/16   Referring Provider Serafina Royals MD     Treadmill   MPH 2.8   Grade 1   Minutes 15   METs 3.53     Elliptical   Level 1   Speed 3.7  Minutes 15     T5 Nustep   Level 3   Watts --  80-100 spm   Minutes 15   METs 2     Prescription Details   Frequency (times per week) 3   Duration Progress to 45 minutes of aerobic exercise without signs/symptoms of physical distress     Intensity   THRR 40-80% of Max Heartrate 106-145   Ratings of Perceived Exertion 11-15   Perceived Dyspnea 0-4     Progression   Progression Continue to progress workloads to maintain intensity without signs/symptoms of physical distress.     Resistance Training   Training Prescription Yes   Weight 4 lbs   Reps 10-15      Perform Capillary Blood Glucose checks as needed.  Exercise Prescription Changes:      Exercise Prescription Changes    Row Name 07/30/16 1400             Exercise Review   Progression -  walk test results         Response to Exercise   Blood Pressure (Admit) 128/74       Blood Pressure (Exercise) 136/64       Blood  Pressure (Exit) 118/60       Heart Rate (Admit) 69 bpm       Heart Rate (Exercise) 110 bpm       Heart Rate (Exit) 72 bpm       Oxygen Saturation (Admit) 99 %       Oxygen Saturation (Exercise) 98 %       Rating of Perceived Exertion (Exercise) 8       Symptoms none          Exercise Comments:      Exercise Comments    Row Name 07/30/16 1445           Exercise Comments Exercise goals are to increase his energy levels.          Discharge Exercise Prescription (Final Exercise Prescription Changes):     Exercise Prescription Changes - 07/30/16 1400      Exercise Review   Progression --  walk test results     Response to Exercise   Blood Pressure (Admit) 128/74   Blood Pressure (Exercise) 136/64   Blood Pressure (Exit) 118/60   Heart Rate (Admit) 69 bpm   Heart Rate (Exercise) 110 bpm   Heart Rate (Exit) 72 bpm   Oxygen Saturation (Admit) 99 %   Oxygen Saturation (Exercise) 98 %   Rating of Perceived Exertion (Exercise) 8   Symptoms none      Nutrition:  Target Goals: Understanding of nutrition guidelines, daily intake of sodium 1500mg , cholesterol 200mg , calories 30% from fat and 7% or less from saturated fats, daily to have 5 or more servings of fruits and vegetables.  Biometrics:     Pre Biometrics - 07/30/16 1447      Pre Biometrics   Height 5' 8.8" (1.748 m)   Weight 238 lb 3.2 oz (108 kg)   Waist Circumference 45 inches   Hip Circumference 41.5 inches   Waist to Hip Ratio 1.08 %   BMI (Calculated) 35.5   Single Leg Stand 25.28 seconds       Nutrition Therapy Plan and Nutrition Goals:     Nutrition Therapy & Goals - 07/30/16 1245      Nutrition Therapy   Drug/Food Interactions Statins/Certain Fruits     Intervention Plan   Intervention Prescribe, educate and  counsel regarding individualized specific dietary modifications aiming towards targeted core components such as weight, hypertension, lipid management, diabetes, heart failure and  other comorbidities.   Expected Outcomes Short Term Goal: Understand basic principles of dietary content, such as calories, fat, sodium, cholesterol and nutrients.;Short Term Goal: A plan has been developed with personal nutrition goals set during dietitian appointment.      Nutrition Discharge: Rate Your Plate Scores:     Nutrition Assessments - 07/30/16 1341      MEDFICTS Scores   Pre Score 85      Nutrition Goals Re-Evaluation:   Psychosocial: Target Goals: Acknowledge presence or absence of depression, maximize coping skills, provide positive support system. Participant is able to verbalize types and ability to use techniques and skills needed for reducing stress and depression.  Initial Review & Psychosocial Screening:     Initial Psych Review & Screening - 07/30/16 1248      Barriers   Psychosocial barriers to participate in program The patient should benefit from training in stress management and relaxation.     Screening Interventions   Interventions Program counselor consult      Quality of Life Scores:     Quality of Life - 07/30/16 1248      Quality of Life Scores   Health/Function Pre 17 %   Socioeconomic Pre 25.71 %   Psych/Spiritual Pre 24.86 %   Family Pre 25.2 %   GLOBAL Pre 21.62 %      PHQ-9: Recent Review Flowsheet Data    Depression screen [facility] 2/9 07/30/2016   Decreased Interest 0   Down, Depressed, Hopeless 0   PHQ - 2 Score 0   Altered sleeping 1   Tired, decreased energy 1   Change in appetite 1   Feeling bad or failure about yourself  0   Trouble concentrating 0   Moving slowly or fidgety/restless 0   Suicidal thoughts 0   PHQ-9 Score 3   Difficult doing work/chores Not difficult at all      Psychosocial Evaluation and Intervention:   Psychosocial Re-Evaluation:   Vocational Rehabilitation: Provide vocational rehab assistance to qualifying candidates.   Vocational Rehab Evaluation & Intervention:     Vocational Rehab  - 07/30/16 1244      Initial Vocational Rehab Evaluation & Intervention   Assessment shows need for Vocational Rehabilitation No      Education: Education Goals: Education classes will be provided on a weekly basis, covering required topics. Participant will state understanding/return demonstration of topics presented.  Learning Barriers/Preferences:     Learning Barriers/Preferences - 07/30/16 1243      Learning Barriers/Preferences   Learning Barriers None   Learning Preferences None      Education Topics: General Nutrition Guidelines/Fats and Fiber: -Group instruction provided by verbal, written material, models and posters to present the general guidelines for heart healthy nutrition. Gives an explanation and review of dietary fats and fiber.   Controlling Sodium/Reading Food Labels: -Group verbal and written material supporting the discussion of sodium use in heart healthy nutrition. Review and explanation with models, verbal and written materials for utilization of the food label.   Exercise Physiology & Risk Factors: - Group verbal and written instruction with models to review the exercise physiology of the cardiovascular system and associated critical values. Details cardiovascular disease risk factors and the goals associated with each risk factor.   Aerobic Exercise & Resistance Training: - Gives group verbal and written discussion on the health impact of inactivity.  On the components of aerobic and resistive training programs and the benefits of this training and how to safely progress through these programs.   Flexibility, Balance, General Exercise Guidelines: - Provides group verbal and written instruction on the benefits of flexibility and balance training programs. Provides general exercise guidelines with specific guidelines to those with heart or lung disease. Demonstration and skill practice provided.   Stress Management: - Provides group verbal and  written instruction about the health risks of elevated stress, cause of high stress, and healthy ways to reduce stress.   Depression: - Provides group verbal and written instruction on the correlation between heart/lung disease and depressed mood, treatment options, and the stigmas associated with seeking treatment.   Anatomy & Physiology of the Heart: - Group verbal and written instruction and models provide basic cardiac anatomy and physiology, with the coronary electrical and arterial systems. Review of: AMI, Angina, Valve disease, Heart Failure, Cardiac Arrhythmia, Pacemakers, and the ICD.   Cardiac Procedures: - Group verbal and written instruction and models to describe the testing methods done to diagnose heart disease. Reviews the outcomes of the test results. Describes the treatment choices: Medical Management, Angioplasty, or Coronary Bypass Surgery.   Cardiac Medications: - Group verbal and written instruction to review commonly prescribed medications for heart disease. Reviews the medication, class of the drug, and side effects. Includes the steps to properly store meds and maintain the prescription regimen.   Go Sex-Intimacy & Heart Disease, Get SMART - Goal Setting: - Group verbal and written instruction through game format to discuss heart disease and the return to sexual intimacy. Provides group verbal and written material to discuss and apply goal setting through the application of the S.M.A.R.T. Method.   Other Matters of the Heart: - Provides group verbal, written materials and models to describe Heart Failure, Angina, Valve Disease, and Diabetes in the realm of heart disease. Includes description of the disease process and treatment options available to the cardiac patient.   Exercise & Equipment Safety: - Individual verbal instruction and demonstration of equipment use and safety with use of the equipment. Flowsheet Row Cardiac Rehab from 07/30/2016 in [facility] Cardiac and  Pulmonary Rehab  Date  07/30/16  Educator  C. EnterkinRN  Instruction Review Code  1- partially meets, needs review/practice      Infection Prevention: - Provides verbal and written material to individual with discussion of infection control including proper hand washing and proper equipment cleaning during exercise session. Flowsheet Row Cardiac Rehab from 07/30/2016 in [facility] Cardiac and Pulmonary Rehab  Date  07/30/16  Educator  C. EnterkinRN  Instruction Review Code  2- meets goals/outcomes      Falls Prevention: - Provides verbal and written material to individual with discussion of falls prevention and safety. Flowsheet Row Cardiac Rehab from 07/30/2016 in [facility] Cardiac and Pulmonary Rehab  Date  07/30/16  Educator  C. ENterkinRN  Instruction Review Code  2- meets goals/outcomes      Diabetes: - Individual verbal and written instruction to review signs/symptoms of diabetes, desired ranges of glucose level fasting, after meals and with exercise. Advice that pre and post exercise glucose checks will be done for 3 sessions at entry of program.    Knowledge Questionnaire Score:     Knowledge Questionnaire Score - 07/30/16 1243      Knowledge Questionnaire Score   Pre Score 20/28      Core Components/Risk Factors/Patient Goals at Admission:     Personal Goals and Risk Factors at  Admission - 07/30/16 1246      Core Components/Risk Factors/Patient Goals on Admission    Weight Management Yes   Intervention Weight Management: Develop a combined nutrition and exercise program designed to reach desired caloric intake, while maintaining appropriate intake of nutrient and fiber, sodium and fats, and appropriate energy expenditure required for the weight goal.;Weight Management: Provide education and appropriate resources to help participant work on and attain dietary goals.;Weight Management/Obesity: Establish reasonable short term and long term weight goals.;Obesity: Provide  education and appropriate resources to help participant work on and attain dietary goals.   Admit Weight 238 lb 3.2 oz (108 kg)   Goal Weight: Short Term 233 lb (105.7 kg)   Goal Weight: Long Term 200 lb (90.7 kg)  weight one year ago was 220lbs   Expected Outcomes Short Term: Continue to assess and modify interventions until short term weight is achieved;Weight Loss: Understanding of general recommendations for a balanced deficit meal plan, which promotes 1-2 lb weight loss per week and includes a negative energy balance of [phone] kcal/d;Long Term: Adherence to nutrition and physical activity/exercise program aimed toward attainment of established weight goal;Understanding recommendations for meals to include 15-35% energy as protein, 25-35% energy from fat, 35-60% energy from carbohydrates, less than 200mg  of dietary cholesterol, 20-35 gm of total fiber daily;Understanding of distribution of calorie intake throughout the day with the consumption of 4-5 meals/snacks   Sedentary Yes   Intervention Provide advice, education, support and counseling about physical activity/exercise needs.;Develop an individualized exercise prescription for aerobic and resistive training based on initial evaluation findings, risk stratification, comorbidities and participant's personal goals.   Expected Outcomes Achievement of increased cardiorespiratory fitness and enhanced flexibility, muscular endurance and strength shown through measurements of functional capacity and personal statement of participant.   Increase Strength and Stamina Yes   Intervention Provide advice, education, support and counseling about physical activity/exercise needs.;Develop an individualized exercise prescription for aerobic and resistive training based on initial evaluation findings, risk stratification, comorbidities and participant's personal goals.   Expected Outcomes Achievement of increased cardiorespiratory fitness and enhanced  flexibility, muscular endurance and strength shown through measurements of functional capacity and personal statement of participant.   Tobacco Cessation Yes   Intervention Assist the participant in steps to quit. Provide individualized education and counseling about committing to Tobacco Cessation, relapse prevention, and pharmacological support that can be provided by physician.;Advice worker, assist with locating and accessing local/national Quit Smoking programs, and support quit date choice.   Expected Outcomes Short Term: Will demonstrate readiness to quit, by selecting a quit date.;Short Term: Will quit all tobacco product use, adhering to prevention of relapse plan.;Long Term: Complete abstinence from all tobacco products for at least 12 months from quit date.   Hypertension Yes   Intervention Provide education on lifestyle modifcations including regular physical activity/exercise, weight management, moderate sodium restriction and increased consumption of fresh fruit, vegetables, and low fat dairy, alcohol moderation, and smoking cessation.;Monitor prescription use compliance.   Expected Outcomes Short Term: Continued assessment and intervention until BP is < 140/23mm HG in hypertensive participants. < 130/69mm HG in hypertensive participants with diabetes, heart failure or chronic kidney disease.;Long Term: Maintenance of blood pressure at goal levels.   Lipids Yes   Intervention Provide education and support for participant on nutrition & aerobic/resistive exercise along with prescribed medications to achieve LDL 70mg , HDL >40mg .   Expected Outcomes Short Term: Participant states understanding of desired cholesterol values and is compliant with medications prescribed. Participant is  following exercise prescription and nutrition guidelines.;Long Term: Cholesterol controlled with medications as prescribed, with individualized exercise RX and with personalized nutrition plan. Value  goals: LDL < 70mg , HDL > 40 mg.      Core Components/Risk Factors/Patient Goals Review:    Core Components/Risk Factors/Patient Goals at Discharge (Final Review):    ITP Comments:     ITP Comments    Row Name 07/30/16 1245 07/30/16 1249         ITP Comments Shamone said he has rejoined the [employer] with his son and is playing some basketball with him.  ITP was created during Medical REview today. DX [facility] 05/08/2016 OP visit         Comments:[place] is ready to start Cardiac Rehab.

## 2016-07-30 NOTE — Progress Notes (Signed)
Daily Session Note  Patient Details  Name: Shaun Anderson MRN: [id number] Date of Birth: [birth year] Referring Provider:    Encounter Date: 07/30/2016  Check In:     Session Check In - 07/30/16 1244      Check-In   Location [facility]-Cardiac & Pulmonary Rehab   Staff Present Heath Lark, RN, BSN, CCRP;Telecia Larocque, RN, Levie Heritage, MA, ACSM RCEP, Exercise Physiologist   Supervising physician immediately available to respond to emergencies See telemetry face sheet for immediately available ER MD   Medication changes reported     No   Fall or balance concerns reported    No   Warm-up and Cool-down Performed as group-led instruction   Resistance Training Performed Yes   VAD Patient? No     Pain Assessment   Currently in Pain? No/denies         Goals Met:  Personal goals reviewed No report of cardiac concerns or symptoms  Goals Unmet:  Not Applicable  Comments:     Dr. Emily Filbert is Medical Director for [place] and [facility].

## 2016-07-30 NOTE — Patient Instructions (Addendum)
Patient Instructions  Patient Details  Name: Shaun Anderson MRN: [id number] Date of Birth: Jan 10, 1961 Referring Provider:  Corey Skains, MD  Below are the personal goals you chose as well as exercise and nutrition goals. Our goal is to help you keep on track towards obtaining and maintaining your goals. We will be discussing your progress on these goals with you throughout the program.  Initial Exercise Prescription:     Initial Exercise Prescription - 07/30/16 1400      Date of Initial Exercise RX and Referring Provider   Date 07/30/16   Referring Provider Serafina Royals MD     Treadmill   MPH 2.8   Grade 1   Minutes 15   METs 3.53     Elliptical   Level 1   Speed 3.7   Minutes 15     T5 Nustep   Level 3   Watts --  80-100 spm   Minutes 15   METs 2     Prescription Details   Frequency (times per week) 3   Duration Progress to 45 minutes of aerobic exercise without signs/symptoms of physical distress     Intensity   THRR 40-80% of Max Heartrate 106-145   Ratings of Perceived Exertion 11-15   Perceived Dyspnea 0-4     Progression   Progression Continue to progress workloads to maintain intensity without signs/symptoms of physical distress.     Resistance Training   Training Prescription Yes   Weight 4 lbs   Reps 10-15      Exercise Goals: Frequency: Be able to perform aerobic exercise three times per week working toward 3-5 days per week.  Intensity: Work with a perceived exertion of 11 (fairly light) - 15 (hard) as tolerated. Follow your new exercise prescription and watch for changes in prescription as you progress with the program. Changes will be reviewed with you when they are made.  Duration: You should be able to do 30 minutes of continuous aerobic exercise in addition to a 5 minute warm-up and a 5 minute cool-down routine.  Nutrition Goals: Your personal nutrition goals will be established when you do your nutrition analysis with the  dietician.  The following are nutrition guidelines to follow: Cholesterol < 200mg /day Sodium < 1500mg /day Fiber: Men over 50 yrs - 30 grams per day  Personal Goals:     Personal Goals and Risk Factors at Admission - 07/30/16 1246      Core Components/Risk Factors/Patient Goals on Admission    Weight Management Yes   Intervention Weight Management: Develop a combined nutrition and exercise program designed to reach desired caloric intake, while maintaining appropriate intake of nutrient and fiber, sodium and fats, and appropriate energy expenditure required for the weight goal.;Weight Management: Provide education and appropriate resources to help participant work on and attain dietary goals.;Weight Management/Obesity: Establish reasonable short term and long term weight goals.;Obesity: Provide education and appropriate resources to help participant work on and attain dietary goals.   Admit Weight 238 lb 3.2 oz (108 kg)   Goal Weight: Short Term 233 lb (105.7 kg)   Goal Weight: Long Term 200 lb (90.7 kg)  weight one year ago was 220lbs   Expected Outcomes Short Term: Continue to assess and modify interventions until short term weight is achieved;Weight Loss: Understanding of general recommendations for a balanced deficit meal plan, which promotes 1-2 lb weight loss per week and includes a negative energy balance of [phone] kcal/d;Long Term: Adherence to nutrition and physical activity/exercise  program aimed toward attainment of established weight goal;Understanding recommendations for meals to include 15-35% energy as protein, 25-35% energy from fat, 35-60% energy from carbohydrates, less than 200mg  of dietary cholesterol, 20-35 gm of total fiber daily;Understanding of distribution of calorie intake throughout the day with the consumption of 4-5 meals/snacks   Sedentary Yes   Intervention Provide advice, education, support and counseling about physical activity/exercise needs.;Develop an  individualized exercise prescription for aerobic and resistive training based on initial evaluation findings, risk stratification, comorbidities and participant's personal goals.   Expected Outcomes Achievement of increased cardiorespiratory fitness and enhanced flexibility, muscular endurance and strength shown through measurements of functional capacity and personal statement of participant.   Increase Strength and Stamina Yes   Intervention Provide advice, education, support and counseling about physical activity/exercise needs.;Develop an individualized exercise prescription for aerobic and resistive training based on initial evaluation findings, risk stratification, comorbidities and participant's personal goals.   Expected Outcomes Achievement of increased cardiorespiratory fitness and enhanced flexibility, muscular endurance and strength shown through measurements of functional capacity and personal statement of participant.   Tobacco Cessation Yes   Intervention Assist the participant in steps to quit. Provide individualized education and counseling about committing to Tobacco Cessation, relapse prevention, and pharmacological support that can be provided by physician.;Advice worker, assist with locating and accessing local/national Quit Smoking programs, and support quit date choice.   Expected Outcomes Short Term: Will demonstrate readiness to quit, by selecting a quit date.;Short Term: Will quit all tobacco product use, adhering to prevention of relapse plan.;Long Term: Complete abstinence from all tobacco products for at least 12 months from quit date.   Hypertension Yes   Intervention Provide education on lifestyle modifcations including regular physical activity/exercise, weight management, moderate sodium restriction and increased consumption of fresh fruit, vegetables, and low fat dairy, alcohol moderation, and smoking cessation.;Monitor prescription use compliance.    Expected Outcomes Short Term: Continued assessment and intervention until BP is < 140/73mm HG in hypertensive participants. < 130/66mm HG in hypertensive participants with diabetes, heart failure or chronic kidney disease.;Long Term: Maintenance of blood pressure at goal levels.   Lipids Yes   Intervention Provide education and support for participant on nutrition & aerobic/resistive exercise along with prescribed medications to achieve LDL 70mg , HDL >40mg .   Expected Outcomes Short Term: Participant states understanding of desired cholesterol values and is compliant with medications prescribed. Participant is following exercise prescription and nutrition guidelines.;Long Term: Cholesterol controlled with medications as prescribed, with individualized exercise RX and with personalized nutrition plan. Value goals: LDL < 70mg , HDL > 40 mg.      Tobacco Use Initial Evaluation: History  Smoking Status  . Current Every Day Smoker  . Packs/day: 1.00  . Types: Cigarettes  Smokeless Tobacco  . Never Used    Copy of goals given to participant.

## 2016-08-01 ENCOUNTER — Encounter: Payer: BC Managed Care – PPO | Admitting: *Deleted

## 2016-08-01 DIAGNOSIS — Z48812 Encounter for surgical aftercare following surgery on the circulatory system: Secondary | ICD-10-CM | POA: Diagnosis not present

## 2016-08-01 DIAGNOSIS — Z9861 Coronary angioplasty status: Secondary | ICD-10-CM

## 2016-08-01 NOTE — Progress Notes (Signed)
Daily Session Note  Patient Details  Name: Shaun Anderson MRN: [id number] Date of Birth: [birth year] Referring Provider:   Flowsheet Row Cardiac Rehab from 07/30/2016 in [facility] Cardiac and Pulmonary Rehab  Referring Provider  Serafina Royals MD      Encounter Date: 08/01/2016  Check In:     Session Check In - 08/01/16 0841      Check-In   Location [facility]-Cardiac & Pulmonary Rehab   Staff Present Alberteen Sam, MA, ACSM RCEP, Exercise Physiologist;Amanda Oletta Darter, BA, ACSM CEP, Exercise Physiologist;Susanne Bice, RN, BSN, CCRP   Supervising physician immediately available to respond to emergencies See telemetry face sheet for immediately available ER MD   Medication changes reported     No   Fall or balance concerns reported    No   Warm-up and Cool-down Performed on first and last piece of equipment   Resistance Training Performed Yes   VAD Patient? No     Pain Assessment   Currently in Pain? No/denies   Multiple Pain Sites No         Goals Met:  Exercise tolerated well Personal goals reviewed No report of cardiac concerns or symptoms Strength training completed today  Goals Unmet:  Not Applicable  Comments: First full day of exercise!  Patient was oriented to gym and equipment including functions, settings, policies, and procedures.  Patient's individual exercise prescription and treatment plan were reviewed.  All starting workloads were established based on the results of the 6 minute walk test done at initial orientation visit.  The plan for exercise progression was also introduced and progression will be customized based on patient's performance and goals.    Dr. Emily Filbert is Medical Director for [facility] and [facility].

## 2016-08-03 ENCOUNTER — Encounter: Payer: BC Managed Care – PPO | Admitting: *Deleted

## 2016-08-03 DIAGNOSIS — Z9861 Coronary angioplasty status: Secondary | ICD-10-CM

## 2016-08-03 DIAGNOSIS — Z48812 Encounter for surgical aftercare following surgery on the circulatory system: Secondary | ICD-10-CM | POA: Diagnosis not present

## 2016-08-03 NOTE — Progress Notes (Signed)
Daily Session Note  Patient Details  Name: Shaun Anderson MRN: [id number] Date of Birth: 12/16/1960 Referring Provider:   Flowsheet Row Cardiac Rehab from 07/30/2016 in [facility] Cardiac and Pulmonary Rehab  Referring Provider  Serafina Royals MD      Encounter Date: 08/03/2016  Check In:     Session Check In - 08/03/16 0900      Check-In   Staff Present Heath Lark, RN, BSN, Gordy Councilman, MA, ACSM RCEP, Exercise Physiologist   Supervising physician immediately available to respond to emergencies See telemetry face sheet for immediately available ER MD   Medication changes reported     No   Fall or balance concerns reported    No   Warm-up and Cool-down Performed on first and last piece of equipment   Resistance Training Performed Yes   VAD Patient? No     Pain Assessment   Currently in Pain? No/denies         Goals Met:  Exercise tolerated well No report of cardiac concerns or symptoms Strength training completed today  Goals Unmet:  Not Applicable  Comments: Doing well with exercise prescription progression.    Dr. Emily Filbert is Medical Director for [place] and [facility].

## 2016-08-06 ENCOUNTER — Encounter: Payer: BC Managed Care – PPO | Admitting: *Deleted

## 2016-08-06 DIAGNOSIS — Z48812 Encounter for surgical aftercare following surgery on the circulatory system: Secondary | ICD-10-CM | POA: Diagnosis not present

## 2016-08-06 DIAGNOSIS — I10 Essential (primary) hypertension: Secondary | ICD-10-CM | POA: Insufficient documentation

## 2016-08-06 DIAGNOSIS — Z9861 Coronary angioplasty status: Secondary | ICD-10-CM

## 2016-08-06 NOTE — Progress Notes (Signed)
Daily Session Note  Patient Details  Name: Shaun Anderson MRN: [id number] Date of Birth: October 10, 1960 Referring Provider:   Flowsheet Row Cardiac Rehab from 07/30/2016 in [facility] Cardiac and Pulmonary Rehab  Referring Provider  Serafina Royals MD      Encounter Date: 08/06/2016  Check In:     Session Check In - 08/06/16 0850      Check-In   Location [facility]-Cardiac & Pulmonary Rehab   Staff Present Alberteen Sam, MA, ACSM RCEP, Exercise Physiologist;Kelly Amedeo Plenty, BS, ACSM CEP, Exercise Physiologist;Carroll Enterkin, RN, BSN   Supervising physician immediately available to respond to emergencies See telemetry face sheet for immediately available ER MD   Medication changes reported     No   Fall or balance concerns reported    No   Warm-up and Cool-down Performed on first and last piece of equipment   Resistance Training Performed Yes   VAD Patient? No     Pain Assessment   Currently in Pain? No/denies   Multiple Pain Sites No         Goals Met:  Independence with exercise equipment Exercise tolerated well No report of cardiac concerns or symptoms Strength training completed today  Goals Unmet:  Not Applicable  Comments: Pt able to follow exercise prescription today without complaint.  Will continue to monitor for progression.  Reviewed home exercise with pt today.  Pt plans to walking at home and playing basketball at the [facility] for exercise.  Reviewed THR, pulse, RPE, sign and symptoms, NTG use, and when to call 911 or MD.  Also discussed weather considerations and indoor options.  Pt voiced understanding.   Dr. Emily Filbert is Medical Director for [place] and [facility].

## 2016-08-13 ENCOUNTER — Encounter: Payer: BC Managed Care – PPO | Admitting: *Deleted

## 2016-08-13 DIAGNOSIS — Z48812 Encounter for surgical aftercare following surgery on the circulatory system: Secondary | ICD-10-CM | POA: Diagnosis not present

## 2016-08-13 DIAGNOSIS — Z9861 Coronary angioplasty status: Secondary | ICD-10-CM

## 2016-08-13 NOTE — Progress Notes (Signed)
Daily Session Note  Patient Details  Name: Shaun Anderson MRN: [id number] Date of Birth: [birth year] Referring Provider:   Flowsheet Row Cardiac Rehab from 07/30/2016 in [facility] Cardiac and Pulmonary Rehab  Referring Provider  Serafina Royals MD      Encounter Date: 08/13/2016  Check In:     Session Check In - 08/13/16 0759      Check-In   Location [facility]-Cardiac & Pulmonary Rehab   Staff Present Gerlene Burdock, RN, BSN;Susanne Bice, RN, BSN, Laveda Norman, BS, ACSM CEP, Exercise Physiologist   Supervising physician immediately available to respond to emergencies See telemetry face sheet for immediately available ER MD   Medication changes reported     No   Fall or balance concerns reported    No   Warm-up and Cool-down Performed on first and last piece of equipment   Resistance Training Performed Yes   VAD Patient? No     Pain Assessment   Currently in Pain? No/denies   Multiple Pain Sites No         Goals Met:  Independence with exercise equipment Exercise tolerated well No report of cardiac concerns or symptoms Strength training completed today  Goals Unmet:  Not Applicable  Comments: Pt able to follow exercise prescription today without complaint.  Will continue to monitor for progression. Started intervals today on the NuStep!   Dr. Emily Filbert is Medical Director for [place] and [facility].

## 2016-08-22 ENCOUNTER — Encounter: Payer: Self-pay | Admitting: *Deleted

## 2016-08-22 DIAGNOSIS — Z9861 Coronary angioplasty status: Secondary | ICD-10-CM

## 2016-08-22 DIAGNOSIS — Z48812 Encounter for surgical aftercare following surgery on the circulatory system: Secondary | ICD-10-CM | POA: Diagnosis not present

## 2016-08-22 NOTE — Progress Notes (Signed)
Cardiac Individual Treatment Plan  Patient Details  Name: Shaun Anderson MRN: [id number] Date of Birth: 08-29-1960 Referring Provider:   Flowsheet Row Cardiac Rehab from 07/30/2016 in [facility] Cardiac and Pulmonary Rehab  Referring Provider  Serafina Royals MD      Initial Encounter Date:  Flowsheet Row Cardiac Rehab from 07/30/2016 in [facility] Cardiac and Pulmonary Rehab  Date  07/30/16  Referring Provider  Serafina Royals MD      Visit Diagnosis: S/P PTCA (percutaneous transluminal coronary angioplasty)  Patient's Home Medications on Admission:  Current Outpatient Prescriptions:  .  aspirin EC 81 MG tablet, Take 81 mg by mouth daily., Disp: , Rfl:  .  atorvastatin (LIPITOR) 80 MG tablet, Take 1 tablet (80 mg total) by mouth daily., Disp: 90 tablet, Rfl: 4 .  clopidogrel (PLAVIX) 75 MG tablet, Take 1 tablet (75 mg total) by mouth daily., Disp: 90 tablet, Rfl: 4 .  isosorbide mononitrate (IMDUR) 30 MG 24 hr tablet, Take 30 mg by mouth daily., Disp: , Rfl:  .  metoprolol succinate (TOPROL XL) 25 MG 24 hr tablet, Take 1 tablet (25 mg total) by mouth daily., Disp: 90 tablet, Rfl: 4  Past Medical History: Past Medical History:  Diagnosis Date  . Hyperlipidemia     Tobacco Use: History  Smoking Status  . Current Every Day Smoker  . Packs/day: 1.00  . Types: Cigarettes  Smokeless Tobacco  . Never Used    Labs: Recent Review Flowsheet Data    There is no flowsheet data to display.       Exercise Target Goals:    Exercise Program Goal: Individual exercise prescription set with THRR, safety & activity barriers. Participant demonstrates ability to understand and report RPE using BORG scale, to self-measure pulse accurately, and to acknowledge the importance of the exercise prescription.  Exercise Prescription Goal: Starting with aerobic activity 30 plus minutes a day, 3 days per week for initial exercise prescription. Provide home exercise prescription and guidelines that  participant acknowledges understanding prior to discharge.  Activity Barriers & Risk Stratification:     Activity Barriers & Cardiac Risk Stratification - 07/30/16 1242      Activity Barriers & Cardiac Risk Stratification   Activity Barriers None   Cardiac Risk Stratification High      6 Minute Walk:     6 Minute Walk    Row Name 07/30/16 1440         6 Minute Walk   Phase Initial     Distance 1540 feet     Walk Time 6 minutes     # of Rest Breaks 0     MPH 2.92     METS 3.82     RPE 8     VO2 Peak 13.37     Symptoms No     Resting HR 67 bpm     Resting BP 128/74     Max Ex. HR 110 bpm     Max Ex. BP 136/64     2 Minute Post BP 118/60        Initial Exercise Prescription:     Initial Exercise Prescription - 07/30/16 1400      Date of Initial Exercise RX and Referring Provider   Date 07/30/16   Referring Provider Serafina Royals MD     Treadmill   MPH 2.8   Grade 1   Minutes 15   METs 3.53     Elliptical   Level 1   Speed 3.7  Minutes 15     T5 Nustep   Level 3   Watts --  80-100 spm   Minutes 15   METs 2     Prescription Details   Frequency (times per week) 3   Duration Progress to 45 minutes of aerobic exercise without signs/symptoms of physical distress     Intensity   THRR 40-80% of Max Heartrate 106-145   Ratings of Perceived Exertion 11-15   Perceived Dyspnea 0-4     Progression   Progression Continue to progress workloads to maintain intensity without signs/symptoms of physical distress.     Resistance Training   Training Prescription Yes   Weight 4 lbs   Reps 10-15      Perform Capillary Blood Glucose checks as needed.  Exercise Prescription Changes:     Exercise Prescription Changes    Row Name 07/30/16 1400 08/01/16 1300 08/06/16 0800 08/14/16 1400       Exercise Review   Progression -  walk test results -  First Full Day of Exercise  - Yes      Response to Exercise   Blood Pressure (Admit) 128/74 126/64  -  114/56    Blood Pressure (Exercise) 136/64 136/74  - 138/70    Blood Pressure (Exit) 118/60 136/80  - 132/64    Heart Rate (Admit) 69 bpm 92 bpm  - 73 bpm    Heart Rate (Exercise) 110 bpm 103 bpm  - 98 bpm    Heart Rate (Exit) 72 bpm 67 bpm  - 70 bpm    Oxygen Saturation (Admit) 99 %  -  -  -    Oxygen Saturation (Exercise) 98 %  -  -  -    Rating of Perceived Exertion (Exercise) 8 13  - 15    Symptoms none none none none    Comments  -  - Home Exercise Guidelines given 08/06/16 Home Exercise Guidelines given 08/06/16    Duration  - Progress to 45 minutes of aerobic exercise without signs/symptoms of physical distress Progress to 45 minutes of aerobic exercise without signs/symptoms of physical distress Progress to 45 minutes of aerobic exercise without signs/symptoms of physical distress    Intensity  - THRR unchanged THRR unchanged THRR unchanged      Progression   Progression  - Continue to progress workloads to maintain intensity without signs/symptoms of physical distress. Continue to progress workloads to maintain intensity without signs/symptoms of physical distress. Continue to progress workloads to maintain intensity without signs/symptoms of physical distress.    Average METs  - 2.81 2.81 4.2      Resistance Training   Training Prescription  - Yes Yes Yes    Weight  - 4 lbs 4 lbs 4 lbs    Reps  - 10-15 10-15 10-15      Interval Training   Interval Training  - No No No      Treadmill   MPH  - 2.8 2.8 3.3    Grade  - _0 Minutes  - _1 METs  - 3.53 3.53 4.89      Elliptical   Level  -  -  - 2    Speed  -  -  - 4.8    Minutes  -  -  - 15      T5 Nustep   Level  - _2 Watts  - -  122 spm -  122 spm  -    Minutes  - _0 METs  - 2.1 2.1 3.5      Home Exercise Plan   Plans to continue exercise at  -  - [employer] (comment)  walking and basketball at the [employer] (comment)  walking and basketball at the [facility]     Frequency  -  - Add 2 additional days to program exercise sessions. Add 2 additional days to program exercise sessions.       Exercise Comments:     Exercise Comments    Row Name 07/30/16 1445 08/01/16 0851 08/06/16 0851 08/13/16 0914 08/14/16 1448   Exercise Comments Exercise goals are to increase his energy levels. First full day of exercise!  Patient was oriented to gym and equipment including functions, settings, policies, and procedures.  Patient's individual exercise prescription and treatment plan were reviewed.  All starting workloads were established based on the results of the 6 minute walk test done at initial orientation visit.  The plan for exercise progression was also introduced and progression will be customized based on patient's performance and goals. Reviewed METs average and discussed progression with pt today.  Reviewed home exercise with pt today.  Pt plans to walking at home and playing basketball at the [facility] for exercise.  Reviewed THR, pulse, RPE, sign and symptoms, NTG use, and when to call 911 or MD.  Also discussed weather considerations and indoor options.  Pt voiced understanding. Started intervals today on the NuStep! Ray is off to a good start in rehab.  He likes the challenge of the intervals.  He is also up to level 2 on the Ellpitical.  We will continue to monitor his progression.      Discharge Exercise Prescription (Final Exercise Prescription Changes):     Exercise Prescription Changes - 08/14/16 1400      Exercise Review   Progression Yes     Response to Exercise   Blood Pressure (Admit) 114/56   Blood Pressure (Exercise) 138/70   Blood Pressure (Exit) 132/64   Heart Rate (Admit) 73 bpm   Heart Rate (Exercise) 98 bpm   Heart Rate (Exit) 70 bpm   Rating of Perceived Exertion (Exercise) 15   Symptoms none   Comments Home Exercise Guidelines given 08/06/16   Duration Progress to 45 minutes of aerobic exercise without signs/symptoms of physical  distress   Intensity THRR unchanged     Progression   Progression Continue to progress workloads to maintain intensity without signs/symptoms of physical distress.   Average METs 4.2     Resistance Training   Training Prescription Yes   Weight 4 lbs   Reps 10-15     Interval Training   Interval Training No     Treadmill   MPH 3.3   Grade 3   Minutes 15   METs 4.89     Elliptical   Level 2   Speed 4.8   Minutes 15     T5 Nustep   Level 5   Minutes 15   METs 3.5     Home Exercise Plan   Plans to continue exercise at [employer] (comment)  walking and basketball at the [facility]   Frequency Add 2 additional days to program exercise sessions.      Nutrition:  Target Goals: Understanding of nutrition guidelines, daily intake of sodium <1573m, cholesterol <2064m calories 30% from fat and 7% or less from saturated fats, daily to have  5 or more servings of fruits and vegetables.  Biometrics:     Pre Biometrics - 07/30/16 1447      Pre Biometrics   Height 5' 8.8" (1.748 m)   Weight 238 lb 3.2 oz (108 kg)   Waist Circumference 45 inches   Hip Circumference 41.5 inches   Waist to Hip Ratio 1.08 %   BMI (Calculated) 35.5   Single Leg Stand 25.28 seconds       Nutrition Therapy Plan and Nutrition Goals:     Nutrition Therapy & Goals - 07/30/16 1245      Nutrition Therapy   Drug/Food Interactions Statins/Certain Fruits     Intervention Plan   Intervention Prescribe, educate and counsel regarding individualized specific dietary modifications aiming towards targeted core components such as weight, hypertension, lipid management, diabetes, heart failure and other comorbidities.   Expected Outcomes Short Term Goal: Understand basic principles of dietary content, such as calories, fat, sodium, cholesterol and nutrients.;Short Term Goal: A plan has been developed with personal nutrition goals set during dietitian appointment.      Nutrition Discharge: Rate Your  Plate Scores:     Nutrition Assessments - 07/30/16 1341      MEDFICTS Scores   Pre Score 85      Nutrition Goals Re-Evaluation:   Psychosocial: Target Goals: Acknowledge presence or absence of depression, maximize coping skills, provide positive support system. Participant is able to verbalize types and ability to use techniques and skills needed for reducing stress and depression.  Initial Review & Psychosocial Screening:     Initial Psych Review & Screening - 07/30/16 1248      Barriers   Psychosocial barriers to participate in program The patient should benefit from training in stress management and relaxation.     Screening Interventions   Interventions Program counselor consult      Quality of Life Scores:     Quality of Life - 07/30/16 1248      Quality of Life Scores   Health/Function Pre 17 %   Socioeconomic Pre 25.71 %   Psych/Spiritual Pre 24.86 %   Family Pre 25.2 %   GLOBAL Pre 21.62 %      PHQ-9: Recent Review Flowsheet Data    Depression screen [facility] 2/9 07/30/2016   Decreased Interest 0   Down, Depressed, Hopeless 0   PHQ - 2 Score 0   Altered sleeping 1   Tired, decreased energy 1   Change in appetite 1   Feeling bad or failure about yourself  0   Trouble concentrating 0   Moving slowly or fidgety/restless 0   Suicidal thoughts 0   PHQ-9 Score 3   Difficult doing work/chores Not difficult at all      Psychosocial Evaluation and Intervention:     Psychosocial Evaluation - 08/13/16 0926      Psychosocial Evaluation & Interventions   Interventions Encouraged to exercise with the program and follow exercise prescription   Comments Counselor met with Mr. Vinzant today for initial psychosocial evaluation.  He is a 56 year old who had a heart attack and (4) stents inserted in October, 2017.  He has a spouse of 92 years and a teenaged son who live with him.  Mr. D also has other family support who live close by.  He states he has been sleeping  better since beginning this program several weeks ago.  His appetite is good and he denies a history of depression or anxity or any current symptoms.  Mr. D reports minimal stress in his life and states he is typically in a positive mood most of the time.  He has goals to lose weight, increase his energy and his stamina while in this program.  [facility] to [facility] with his son more often upon discharge from this program.  Staff will continue to follow with Mr. D throughout the course of this program.        Psychosocial Re-Evaluation:   Vocational Rehabilitation: Provide vocational rehab assistance to qualifying candidates.   Vocational Rehab Evaluation & Intervention:     Vocational Rehab - 07/30/16 1244      Initial Vocational Rehab Evaluation & Intervention   Assessment shows need for Vocational Rehabilitation No      Education: Education Goals: Education classes will be provided on a weekly basis, covering required topics. Participant will state understanding/return demonstration of topics presented.  Learning Barriers/Preferences:     Learning Barriers/Preferences - 07/30/16 1243      Learning Barriers/Preferences   Learning Barriers None   Learning Preferences None      Education Topics: General Nutrition Guidelines/Fats and Fiber: -Group instruction provided by verbal, written material, models and posters to present the general guidelines for heart healthy nutrition. Gives an explanation and review of dietary fats and fiber.   Controlling Sodium/Reading Food Labels: -Group verbal and written material supporting the discussion of sodium use in heart healthy nutrition. Review and explanation with models, verbal and written materials for utilization of the food label. Flowsheet Row Cardiac Rehab from 08/13/2016 in [facility] Cardiac and Pulmonary Rehab  Date  08/06/16  Educator  CR  Instruction Review Code  2- meets goals/outcomes       Exercise Physiology & Risk Factors: - Group verbal and written instruction with models to review the exercise physiology of the cardiovascular system and associated critical values. Details cardiovascular disease risk factors and the goals associated with each risk factor. Flowsheet Row Cardiac Rehab from 08/13/2016 in [facility] Cardiac and Pulmonary Rehab  Date  08/13/16  Educator  [facility]  Instruction Review Code  2- meets goals/outcomes      Aerobic Exercise & Resistance Training: - Gives group verbal and written discussion on the health impact of inactivity. On the components of aerobic and resistive training programs and the benefits of this training and how to safely progress through these programs.   Flexibility, Balance, General Exercise Guidelines: - Provides group verbal and written instruction on the benefits of flexibility and balance training programs. Provides general exercise guidelines with specific guidelines to those with heart or lung disease. Demonstration and skill practice provided.   Stress Management: - Provides group verbal and written instruction about the health risks of elevated stress, cause of high stress, and healthy ways to reduce stress.   Depression: - Provides group verbal and written instruction on the correlation between heart/lung disease and depressed mood, treatment options, and the stigmas associated with seeking treatment. Flowsheet Row Cardiac Rehab from 08/13/2016 in [facility] Cardiac and Pulmonary Rehab  Date  08/01/16  Educator  [facility]  Instruction Review Code  2- meets goals/outcomes      Anatomy & Physiology of the Heart: - Group verbal and written instruction and models provide basic cardiac anatomy and physiology, with the coronary electrical and arterial systems. Review of: AMI, Angina, Valve disease, Heart Failure, Cardiac Arrhythmia, Pacemakers, and the ICD.   Cardiac Procedures: - Group verbal and written instruction and models to describe the  testing methods done to diagnose heart disease. Reviews the outcomes of the test results. Describes the treatment choices: Medical Management, Angioplasty, or Coronary Bypass Surgery.   Cardiac Medications: - Group verbal and written instruction to review commonly prescribed medications for heart disease. Reviews the medication, class of the drug, and side effects. Includes the steps to properly store meds and maintain the prescription regimen.   Go Sex-Intimacy & Heart Disease, Get SMART - Goal Setting: - Group verbal and written instruction through game format to discuss heart disease and the return to sexual intimacy. Provides group verbal and written material to discuss and apply goal setting through the application of the S.M.A.R.T. Method.   Other Matters of the Heart: - Provides group verbal, written materials and models to describe Heart Failure, Angina, Valve Disease, and Diabetes in the realm of heart disease. Includes description of the disease process and treatment options available to the cardiac patient.   Exercise & Equipment Safety: - Individual verbal instruction and demonstration of equipment use and safety with use of the equipment. Flowsheet Row Cardiac Rehab from 08/13/2016 in [facility] Cardiac and Pulmonary Rehab  Date  07/30/16  Educator  C. EnterkinRN  Instruction Review Code  1- partially meets, needs review/practice      Infection Prevention: - Provides verbal and written material to individual with discussion of infection control including proper hand washing and proper equipment cleaning during exercise session. Flowsheet Row Cardiac Rehab from 08/13/2016 in [facility] Cardiac and Pulmonary Rehab  Date  07/30/16  Educator  C. EnterkinRN  Instruction Review Code  2- meets goals/outcomes      Falls Prevention: - Provides verbal and written material to individual with discussion of falls prevention and safety. Flowsheet Row Cardiac Rehab from 08/13/2016 in [facility] Cardiac  and Pulmonary Rehab  Date  07/30/16  Educator  C. ENterkinRN  Instruction Review Code  2- meets goals/outcomes      Diabetes: - Individual verbal and written instruction to review signs/symptoms of diabetes, desired ranges of glucose level fasting, after meals and with exercise. Advice that pre and post exercise glucose checks will be done for 3 sessions at entry of program.    Knowledge Questionnaire Score:     Knowledge Questionnaire Score - 08/01/16 0842      Knowledge Questionnaire Score   Pre Score 20/28  Reviewed test results and answers with Kyung Rudd today      Core Components/Risk Factors/Patient Goals at Admission:     Personal Goals and Risk Factors at Admission - 07/30/16 1246      Core Components/Risk Factors/Patient Goals on Admission    Weight Management Yes   Intervention Weight Management: Develop a combined nutrition and exercise program designed to reach desired caloric intake, while maintaining appropriate intake of nutrient and fiber, sodium and fats, and appropriate energy expenditure required for the weight goal.;Weight Management: Provide education and appropriate resources to help participant work on and attain dietary goals.;Weight Management/Obesity: Establish reasonable short term and long term weight goals.;Obesity: Provide education and appropriate resources to help participant work on and attain dietary goals.   Admit Weight 238 lb 3.2 oz (108 kg)   Goal Weight: Short Term 233 lb (105.7 kg)   Goal Weight: Long Term 200 lb (90.7 kg)  weight one year ago was 220lbs   Expected Outcomes Short Term: Continue to assess and modify interventions until short term weight is achieved;Weight Loss: Understanding of general recommendations for a balanced deficit meal plan, which promotes 1-2 lb weight loss per  week and includes a negative energy balance of [phone] kcal/d;Long Term: Adherence to nutrition and physical activity/exercise program aimed toward attainment  of established weight goal;Understanding recommendations for meals to include 15-35% energy as protein, 25-35% energy from fat, 35-60% energy from carbohydrates, less than 249m of dietary cholesterol, 20-35 gm of total fiber daily;Understanding of distribution of calorie intake throughout the day with the consumption of 4-5 meals/snacks   Sedentary Yes   Intervention Provide advice, education, support and counseling about physical activity/exercise needs.;Develop an individualized exercise prescription for aerobic and resistive training based on initial evaluation findings, risk stratification, comorbidities and participant's personal goals.   Expected Outcomes Achievement of increased cardiorespiratory fitness and enhanced flexibility, muscular endurance and strength shown through measurements of functional capacity and personal statement of participant.   Increase Strength and Stamina Yes   Intervention Provide advice, education, support and counseling about physical activity/exercise needs.;Develop an individualized exercise prescription for aerobic and resistive training based on initial evaluation findings, risk stratification, comorbidities and participant's personal goals.   Expected Outcomes Achievement of increased cardiorespiratory fitness and enhanced flexibility, muscular endurance and strength shown through measurements of functional capacity and personal statement of participant.   Tobacco Cessation Yes   Intervention Assist the participant in steps to quit. Provide individualized education and counseling about committing to Tobacco Cessation, relapse prevention, and pharmacological support that can be provided by physician.;OAdvice worker assist with locating and accessing local/national Quit Smoking programs, and support quit date choice.   Expected Outcomes Short Term: Will demonstrate readiness to quit, by selecting a quit date.;Short Term: Will quit all tobacco product  use, adhering to prevention of relapse plan.;Long Term: Complete abstinence from all tobacco products for at least 12 months from quit date.   Hypertension Yes   Intervention Provide education on lifestyle modifcations including regular physical activity/exercise, weight management, moderate sodium restriction and increased consumption of fresh fruit, vegetables, and low fat dairy, alcohol moderation, and smoking cessation.;Monitor prescription use compliance.   Expected Outcomes Short Term: Continued assessment and intervention until BP is < 140/949mHG in hypertensive participants. < 130/8032mG in hypertensive participants with diabetes, heart failure or chronic kidney disease.;Long Term: Maintenance of blood pressure at goal levels.   Lipids Yes   Intervention Provide education and support for participant on nutrition & aerobic/resistive exercise along with prescribed medications to achieve LDL <79m67mDL >40mg2mExpected Outcomes Short Term: Participant states understanding of desired cholesterol values and is compliant with medications prescribed. Participant is following exercise prescription and nutrition guidelines.;Long Term: Cholesterol controlled with medications as prescribed, with individualized exercise RX and with personalized nutrition plan. Value goals: LDL < 79mg,79m > 40 mg.      Core Components/Risk Factors/Patient Goals Review:    Core Components/Risk Factors/Patient Goals at Discharge (Final Review):    ITP Comments:     ITP Comments    Row Name 07/30/16 1245 07/30/16 1249 08/03/16 0819 08/22/16 0559     ITP Comments RaymonAmarianhe has rejoined the [employer] with his son and is playing some basketball with him.  ITP was created during Medical REview today. DX [facility] 05/08/2016 OP visit Appointment made to meet with RD on 08/20/2016 30 day review. Continue with ITP unless directed changes per Medical Director review.       Comments:

## 2016-08-22 NOTE — Progress Notes (Signed)
Daily Session Note  Patient Details  Name: Shaun Anderson MRN: [id number] Date of Birth: 1961-02-19 Referring Provider:   Flowsheet Row Cardiac Rehab from 07/30/2016 in [facility] Cardiac and Pulmonary Rehab  Referring Provider  Serafina Royals MD      Encounter Date: 08/22/2016  Check In:     Session Check In - 08/22/16 0859      Check-In   Location [facility]-Cardiac & Pulmonary Rehab   Staff Present Nyoka Cowden, RN, BSN, Willette Pa, MA, ACSM RCEP, Exercise Physiologist;Amanda Oletta Darter, [place], ACSM CEP, Exercise Physiologist   Supervising physician immediately available to respond to emergencies See telemetry face sheet for immediately available ER MD   Medication changes reported     No   Fall or balance concerns reported    No   Warm-up and Cool-down Performed on first and last piece of equipment   Resistance Training Performed Yes   VAD Patient? No     Pain Assessment   Currently in Pain? No/denies   Multiple Pain Sites No         Goals Met:  Independence with exercise equipment Exercise tolerated well No report of cardiac concerns or symptoms Strength training completed today  Goals Unmet:  Not Applicable  Comments: Pt able to follow exercise prescription today without complaint.  Will continue to monitor for progression.    Dr. Emily Filbert is Medical Director for [place] and [facility].

## 2016-08-24 ENCOUNTER — Encounter: Payer: BC Managed Care – PPO | Attending: Internal Medicine

## 2016-08-24 DIAGNOSIS — Z48812 Encounter for surgical aftercare following surgery on the circulatory system: Secondary | ICD-10-CM | POA: Insufficient documentation

## 2016-08-24 DIAGNOSIS — Z9889 Other specified postprocedural states: Secondary | ICD-10-CM | POA: Insufficient documentation

## 2016-08-27 ENCOUNTER — Encounter: Payer: BC Managed Care – PPO | Admitting: *Deleted

## 2016-08-27 DIAGNOSIS — Z48812 Encounter for surgical aftercare following surgery on the circulatory system: Secondary | ICD-10-CM | POA: Diagnosis not present

## 2016-08-27 DIAGNOSIS — Z9889 Other specified postprocedural states: Secondary | ICD-10-CM | POA: Diagnosis present

## 2016-08-27 DIAGNOSIS — Z9861 Coronary angioplasty status: Secondary | ICD-10-CM

## 2016-08-27 NOTE — Progress Notes (Signed)
Daily Session Note  Patient Details  Name: Shaun Anderson MRN: [id number] Date of Birth: Dec 19, 1960 Referring Provider:   Flowsheet Row Cardiac Rehab from 07/30/2016 in [facility] Cardiac and Pulmonary Rehab  Referring Provider  Serafina Royals MD      Encounter Date: 08/27/2016  Check In:     Session Check In - 08/27/16 0950      Check-In   Location [facility]-Cardiac & Pulmonary Rehab   Staff Present Nyoka Cowden, RN, BSN, Willette Pa, MA, ACSM RCEP, Exercise Physiologist;Kelly Amedeo Plenty, BS, ACSM CEP, Exercise Physiologist   Supervising physician immediately available to respond to emergencies See telemetry face sheet for immediately available ER MD   Medication changes reported     No   Fall or balance concerns reported    No   Warm-up and Cool-down Performed on first and last piece of equipment   Resistance Training Performed Yes   VAD Patient? No     Pain Assessment   Currently in Pain? No/denies   Multiple Pain Sites No         Goals Met:  Proper associated with RPD/PD & O2 Sat Independence with exercise equipment Exercise tolerated well No report of cardiac concerns or symptoms Strength training completed today  Goals Unmet:  Not Applicable  Comments: Pt able to follow exercise prescription today without complaint.  Will continue to monitor for progression.    Dr. Emily Filbert is Medical Director for [facility] and [facility].

## 2016-09-10 ENCOUNTER — Telehealth: Payer: Self-pay | Admitting: *Deleted

## 2016-09-10 ENCOUNTER — Encounter: Payer: Self-pay | Admitting: *Deleted

## 2016-09-10 DIAGNOSIS — Z9861 Coronary angioplasty status: Secondary | ICD-10-CM

## 2016-09-10 NOTE — Telephone Encounter (Signed)
Called to check on status of return to program.  [facility]

## 2016-09-19 ENCOUNTER — Encounter: Payer: Self-pay | Admitting: *Deleted

## 2016-09-19 DIAGNOSIS — Z9861 Coronary angioplasty status: Secondary | ICD-10-CM

## 2016-09-19 NOTE — Progress Notes (Signed)
Cardiac Individual Treatment Plan  Patient Details  Name: Shaun Anderson MRN: [id number] Date of Birth: [birth year] Referring Provider:   Flowsheet Row Cardiac Rehab from 07/30/2016 in [facility] Cardiac and Pulmonary Rehab  Referring Provider  Serafina Royals MD      Initial Encounter Date:  Flowsheet Row Cardiac Rehab from 07/30/2016 in [facility] Cardiac and Pulmonary Rehab  Date  07/30/16  Referring Provider  Serafina Royals MD      Visit Diagnosis: S/P PTCA (percutaneous transluminal coronary angioplasty)  Patient's Home Medications on Admission:  Current Outpatient Prescriptions:  .  aspirin EC 81 MG tablet, Take 81 mg by mouth daily., Disp: , Rfl:  .  atorvastatin (LIPITOR) 80 MG tablet, Take 1 tablet (80 mg total) by mouth daily., Disp: 90 tablet, Rfl: 4 .  clopidogrel (PLAVIX) 75 MG tablet, Take 1 tablet (75 mg total) by mouth daily., Disp: 90 tablet, Rfl: 4 .  isosorbide mononitrate (IMDUR) 30 MG 24 hr tablet, Take 30 mg by mouth daily., Disp: , Rfl:  .  metoprolol succinate (TOPROL XL) 25 MG 24 hr tablet, Take 1 tablet (25 mg total) by mouth daily., Disp: 90 tablet, Rfl: 4  Past Medical History: Past Medical History:  Diagnosis Date  . Hyperlipidemia     Tobacco Use: History  Smoking Status  . Current Every Day Smoker  . Packs/day: 1.00  . Types: Cigarettes  Smokeless Tobacco  . Never Used    Labs: Recent Review Flowsheet Data    There is no flowsheet data to display.       Exercise Target Goals:    Exercise Program Goal: Individual exercise prescription set with THRR, safety & activity barriers. Participant demonstrates ability to understand and report RPE using BORG scale, to self-measure pulse accurately, and to acknowledge the importance of the exercise prescription.  Exercise Prescription Goal: Starting with aerobic activity 30 plus minutes a day, 3 days per week for initial exercise prescription. Provide home exercise prescription and guidelines that  participant acknowledges understanding prior to discharge.  Activity Barriers & Risk Stratification:     Activity Barriers & Cardiac Risk Stratification - 07/30/16 1242      Activity Barriers & Cardiac Risk Stratification   Activity Barriers None   Cardiac Risk Stratification High      6 Minute Walk:     6 Minute Walk    Row Name 07/30/16 1440         6 Minute Walk   Phase Initial     Distance 1540 feet     Walk Time 6 minutes     # of Rest Breaks 0     MPH 2.92     METS 3.82     RPE 8     VO2 Peak 13.37     Symptoms No     Resting HR 67 bpm     Resting BP 128/74     Max Ex. HR 110 bpm     Max Ex. BP 136/64     2 Minute Post BP 118/60        Oxygen Initial Assessment:   Oxygen Re-Evaluation:   Oxygen Discharge (Final Oxygen Re-Evaluation):   Initial Exercise Prescription:     Initial Exercise Prescription - 07/30/16 1400      Date of Initial Exercise RX and Referring Provider   Date 07/30/16   Referring Provider Serafina Royals MD     Treadmill   MPH 2.8   Grade 1   Minutes 15  METs 3.53     Elliptical   Level 1   Speed 3.7   Minutes 15     T5 Nustep   Level 3   SPM --  80-100 spm   Minutes 15   METs 2     Prescription Details   Frequency (times per week) 3   Duration Progress to 45 minutes of aerobic exercise without signs/symptoms of physical distress     Intensity   THRR 40-80% of Max Heartrate 106-145   Ratings of Perceived Exertion 11-15   Perceived Dyspnea 0-4     Progression   Progression Continue to progress workloads to maintain intensity without signs/symptoms of physical distress.     Resistance Training   Training Prescription Yes   Weight 4 lbs   Reps 10-15      Perform Capillary Blood Glucose checks as needed.  Exercise Prescription Changes:     Exercise Prescription Changes    Row Name 07/30/16 1400 08/01/16 1300 08/06/16 0800 08/14/16 1400 08/27/16 1500     Response to Exercise   Blood Pressure  (Admit) 128/74 126/64  - 114/56 126/60   Blood Pressure (Exercise) 136/64 136/74  - 138/70 146/72   Blood Pressure (Exit) 118/60 136/80  - 132/64 128/72   Heart Rate (Admit) 69 bpm 92 bpm  - 73 bpm 79 bpm   Heart Rate (Exercise) 110 bpm 103 bpm  - 98 bpm 103 bpm   Heart Rate (Exit) 72 bpm 67 bpm  - 70 bpm 76 bpm   Oxygen Saturation (Admit) 99 %  -  -  -  -   Oxygen Saturation (Exercise) 98 %  -  -  -  -   Rating of Perceived Exertion (Exercise) 8 13  - 15 12   Symptoms none none none none none   Comments  -  - Home Exercise Guidelines given 08/06/16 Home Exercise Guidelines given 08/06/16 Home Exercise Guidelines given 08/06/16   Duration  - Progress to 45 minutes of aerobic exercise without signs/symptoms of physical distress Progress to 45 minutes of aerobic exercise without signs/symptoms of physical distress Progress to 45 minutes of aerobic exercise without signs/symptoms of physical distress Progress to 45 minutes of aerobic exercise without signs/symptoms of physical distress   Intensity  - THRR unchanged THRR unchanged THRR unchanged THRR unchanged     Progression   Progression  - Continue to progress workloads to maintain intensity without signs/symptoms of physical distress. Continue to progress workloads to maintain intensity without signs/symptoms of physical distress. Continue to progress workloads to maintain intensity without signs/symptoms of physical distress. Continue to progress workloads to maintain intensity without signs/symptoms of physical distress.   Average METs  - 2.81 2.81 4.2 6.3     Resistance Training   Training Prescription  - Yes Yes Yes Yes   Weight  - 4 lbs 4 lbs 4 lbs 5 lbs   Reps  - 10-15 10-15 10-15 10-15     Interval Training   Interval Training  - No No No Yes   Equipment  -  -  -  - Treadmill;T5 Nustep;Elliptical   Comments  -  -  -  - 2 min off 2 min on     Treadmill   MPH  - 2.8 2.8 3.3 3.3   Grade  - 1 1 3 8    Minutes  - 15 15 15 15    METs  -  3.53 3.53 4.89 7.2     Elliptical  Level  -  -  - 2 2   Speed  -  -  - 4.8 4.8   Minutes  -  -  - 15 15     T5 Nustep   Level  - 3 3 5 5    SPM  - -  122 spm -  122 spm  -  -   Minutes  - 15 15 15 15    METs  - 2.1 2.1 3.5 5.4     Home Exercise Plan   Plans to continue exercise at  -  - [employer] (comment)  walking and basketball at the [employer] (comment)  walking and basketball at the [employer] (comment)  walking and basketball at the [facility]   Frequency  -  - Add 2 additional days to program exercise sessions. Add 2 additional days to program exercise sessions. Add 2 additional days to program exercise sessions.     Exercise Review   Progression -  walk test results -  First Full Day of Exercise  - Yes Yes      Exercise Comments:     Exercise Comments    Row Name 07/30/16 1445 08/01/16 0851 08/06/16 0851 08/13/16 0914 08/14/16 1448   Exercise Comments Exercise goals are to increase his energy levels. First full day of exercise!  Patient was oriented to gym and equipment including functions, settings, policies, and procedures.  Patient's individual exercise prescription and treatment plan were reviewed.  All starting workloads were established based on the results of the 6 minute walk test done at initial orientation visit.  The plan for exercise progression was also introduced and progression will be customized based on patient's performance and goals. Reviewed METs average and discussed progression with pt today.  Reviewed home exercise with pt today.  Pt plans to walking at home and playing basketball at the [facility] for exercise.  Reviewed THR, pulse, RPE, sign and symptoms, NTG use, and when to call 911 or MD.  Also discussed weather considerations and indoor options.  Pt voiced understanding. Started intervals today on the NuStep! Ray is off to a good start in rehab.  He likes the challenge of the intervals.  He is also up to level 2 on the  Ellpitical.  We will continue to monitor his progression.   [place] Name 08/27/16 1530 09/10/16 1615         Exercise Comments Ray continues to do well with exercise.  He tried to do intervals on the ellpitical some today.  We will continue to monitor his progress. Out since last review, left voicemail.         Exercise Goals and Review:   Exercise Goals Re-Evaluation :   Discharge Exercise Prescription (Final Exercise Prescription Changes):     Exercise Prescription Changes - 08/27/16 1500      Response to Exercise   Blood Pressure (Admit) 126/60   Blood Pressure (Exercise) 146/72   Blood Pressure (Exit) 128/72   Heart Rate (Admit) 79 bpm   Heart Rate (Exercise) 103 bpm   Heart Rate (Exit) 76 bpm   Rating of Perceived Exertion (Exercise) 12   Symptoms none   Comments Home Exercise Guidelines given 08/06/16   Duration Progress to 45 minutes of aerobic exercise without signs/symptoms of physical distress   Intensity THRR unchanged     Progression   Progression Continue to progress workloads to maintain intensity without signs/symptoms of physical distress.   Average METs 6.3  Resistance Training   Training Prescription Yes   Weight 5 lbs   Reps 10-15     Interval Training   Interval Training Yes   Equipment Treadmill;T5 Nustep;Elliptical   Comments 2 min off 2 min on     Treadmill   MPH 3.3   Grade 8   Minutes 15   METs 7.2     Elliptical   Level 2   Speed 4.8   Minutes 15     T5 Nustep   Level 5   Minutes 15   METs 5.4     Home Exercise Plan   Plans to continue exercise at [employer] (comment)  walking and basketball at the [facility]   Frequency Add 2 additional days to program exercise sessions.     Exercise Review   Progression Yes      Nutrition:  Target Goals: Understanding of nutrition guidelines, daily intake of sodium <1534m, cholesterol <2052m calories 30% from fat and 7% or less from saturated fats, daily to have 5 or more servings  of fruits and vegetables.  Biometrics:     Pre Biometrics - 07/30/16 1447      Pre Biometrics   Height 5' 8.8" (1.748 m)   Weight 238 lb 3.2 oz (108 kg)   Waist Circumference 45 inches   Hip Circumference 41.5 inches   Waist to Hip Ratio 1.08 %   BMI (Calculated) 35.5   Single Leg Stand 25.28 seconds       Nutrition Therapy Plan and Nutrition Goals:     Nutrition Therapy & Goals - 07/30/16 1245      Nutrition Therapy   Drug/Food Interactions Statins/Certain Fruits     Intervention Plan   Intervention Prescribe, educate and counsel regarding individualized specific dietary modifications aiming towards targeted core components such as weight, hypertension, lipid management, diabetes, heart failure and other comorbidities.   Expected Outcomes Short Term Goal: Understand basic principles of dietary content, such as calories, fat, sodium, cholesterol and nutrients.;Short Term Goal: A plan has been developed with personal nutrition goals set during dietitian appointment.      Nutrition Discharge: Rate Your Plate Scores:     Nutrition Assessments - 07/30/16 1341      MEDFICTS Scores   Pre Score 85      Nutrition Goals Re-Evaluation:   Nutrition Goals Discharge (Final Nutrition Goals Re-Evaluation):   Psychosocial: Target Goals: Acknowledge presence or absence of significant depression and/or stress, maximize coping skills, provide positive support system. Participant is able to verbalize types and ability to use techniques and skills needed for reducing stress and depression.   Initial Review & Psychosocial Screening:     Initial Psych Review & Screening - 07/30/16 1248      Barriers   Psychosocial barriers to participate in program The patient should benefit from training in stress management and relaxation.     Screening Interventions   Interventions Program counselor consult      Quality of Life Scores:      Quality of Life - 07/30/16 1248       Quality of Life Scores   Health/Function Pre 17 %   Socioeconomic Pre 25.71 %   Psych/Spiritual Pre 24.86 %   Family Pre 25.2 %   GLOBAL Pre 21.62 %      PHQ-9: Recent Review Flowsheet Data    [facility] 07/[facility]   Decreased Interest 0   Down, Depressed, Hopeless 0   PHQ - 2 Score 0  Altered sleeping 1   Tired, decreased energy 1   Change in appetite 1   Feeling bad or failure about yourself  0   Trouble concentrating 0   Moving slowly or fidgety/restless 0   Suicidal thoughts 0   PHQ-9 Score 3   Difficult doing work/chores Not difficult at all     Interpretation of Total Score  Total Score Depression Severity:  1-4 = Minimal depression, 5-9 = Mild depression, 10-14 = Moderate depression, 15-19 = Moderately severe depression, 20-27 = Severe depression   Psychosocial Evaluation and Intervention:     Psychosocial Evaluation - 08/13/16 0926      Psychosocial Evaluation & Interventions   Interventions Encouraged to exercise with the program and follow exercise prescription   Comments Counselor met with Mr. Chichester today for initial psychosocial evaluation.  He is a 56 year old who had a heart attack and (4) stents inserted in October, 2017.  He has a spouse of 81 years and a teenaged son who live with him.  Mr. D also has other family support who live close by.  He states he has been sleeping better since beginning this program several weeks ago.  His appetite is good and he denies a history of depression or anxity or any current symptoms.  Mr. D reports minimal stress in his life and states he is typically in a positive mood most of the time.  He has goals to lose weight, increase his energy and his stamina while in this program.  [facility] to play basketball with his son more often upon discharge from this program.  Staff will continue to follow with Mr. D throughout the course of this program.        Psychosocial  Re-Evaluation:   Psychosocial Discharge (Final Psychosocial Re-Evaluation):   Vocational Rehabilitation: Provide vocational rehab assistance to qualifying candidates.   Vocational Rehab Evaluation & Intervention:     Vocational Rehab - 07/30/16 1244      Initial Vocational Rehab Evaluation & Intervention   Assessment shows need for Vocational Rehabilitation No      Education: Education Goals: Education classes will be provided on a weekly basis, covering required topics. Participant will state understanding/return demonstration of topics presented.  Learning Barriers/Preferences:     Learning Barriers/Preferences - 07/30/16 1243      Learning Barriers/Preferences   Learning Barriers None   Learning Preferences None      Education Topics: General Nutrition Guidelines/Fats and Fiber: -Group instruction provided by verbal, written material, models and posters to present the general guidelines for heart healthy nutrition. Gives an explanation and review of dietary fats and fiber.   Controlling Sodium/Reading Food Labels: -Group verbal and written material supporting the discussion of sodium use in heart healthy nutrition. Review and explanation with models, verbal and written materials for utilization of the food label. Flowsheet Row Cardiac Rehab from 08/27/2016 in [facility] Cardiac and Pulmonary Rehab  Date  08/06/16  Educator  CR  Instruction Review Code  2- meets goals/outcomes      Exercise Physiology & Risk Factors: - Group verbal and written instruction with models to review the exercise physiology of the cardiovascular system and associated critical values. Details cardiovascular disease risk factors and the goals associated with each risk factor. Flowsheet Row Cardiac Rehab from 08/27/2016 in [facility] Cardiac and Pulmonary Rehab  Date  08/13/16  Educator  [facility]  Instruction Review Code  2- meets goals/outcomes  Aerobic Exercise & Resistance Training: - Gives group  verbal and written discussion on the health impact of inactivity. On the components of aerobic and resistive training programs and the benefits of this training and how to safely progress through these programs.   Flexibility, Balance, General Exercise Guidelines: - Provides group verbal and written instruction on the benefits of flexibility and balance training programs. Provides general exercise guidelines with specific guidelines to those with heart or lung disease. Demonstration and skill practice provided.   Stress Management: - Provides group verbal and written instruction about the health risks of elevated stress, cause of high stress, and healthy ways to reduce stress.   Depression: - Provides group verbal and written instruction on the correlation between heart/lung disease and depressed mood, treatment options, and the stigmas associated with seeking treatment. Flowsheet Row Cardiac Rehab from 08/27/2016 in [facility] Cardiac and Pulmonary Rehab  Date  08/01/16  Educator  [facility]  Instruction Review Code  2- meets goals/outcomes      Anatomy & Physiology of the Heart: - Group verbal and written instruction and models provide basic cardiac anatomy and physiology, with the coronary electrical and arterial systems. Review of: AMI, Angina, Valve disease, Heart Failure, Cardiac Arrhythmia, Pacemakers, and the ICD. Flowsheet Row Cardiac Rehab from 08/27/2016 in [facility] Cardiac and Pulmonary Rehab  Date  08/27/16  Educator  MA  Instruction Review Code  2- meets goals/outcomes      Cardiac Procedures: - Group verbal and written instruction and models to describe the testing methods done to diagnose heart disease. Reviews the outcomes of the test results. Describes the treatment choices: Medical Management, Angioplasty, or Coronary Bypass Surgery.   Cardiac Medications: - Group verbal and written instruction to review commonly prescribed medications for heart disease. Reviews the medication, class  of the drug, and side effects. Includes the steps to properly store meds and maintain the prescription regimen.   Go Sex-Intimacy & Heart Disease, Get SMART - Goal Setting: - Group verbal and written instruction through game format to discuss heart disease and the return to sexual intimacy. Provides group verbal and written material to discuss and apply goal setting through the application of the S.M.A.R.T. Method.   Other Matters of the Heart: - Provides group verbal, written materials and models to describe Heart Failure, Angina, Valve Disease, and Diabetes in the realm of heart disease. Includes description of the disease process and treatment options available to the cardiac patient. Flowsheet Row Cardiac Rehab from 08/27/2016 in [facility] Cardiac and Pulmonary Rehab  Date  08/27/16  Educator  MA  Instruction Review Code  2- meets goals/outcomes      Exercise & Equipment Safety: - Individual verbal instruction and demonstration of equipment use and safety with use of the equipment. Flowsheet Row Cardiac Rehab from 08/27/2016 in [facility] Cardiac and Pulmonary Rehab  Date  07/30/16  Educator  C. EnterkinRN  Instruction Review Code  1- partially meets, needs review/practice      Infection Prevention: - Provides verbal and written material to individual with discussion of infection control including proper hand washing and proper equipment cleaning during exercise session. Flowsheet Row Cardiac Rehab from 08/27/2016 in [facility] Cardiac and Pulmonary Rehab  Date  07/30/16  Educator  C. EnterkinRN  Instruction Review Code  2- meets goals/outcomes      Falls Prevention: - Provides verbal and written material to individual with discussion of falls prevention and safety. Flowsheet Row Cardiac Rehab from 08/27/2016 in [facility] Cardiac and Pulmonary Rehab  Date  07/30/16  Educator  C. ENterkinRN  Instruction Review Code  2- meets goals/outcomes      Diabetes: - Individual verbal and written instruction  to review signs/symptoms of diabetes, desired ranges of glucose level fasting, after meals and with exercise. Advice that pre and post exercise glucose checks will be done for 3 sessions at entry of program.    Knowledge Questionnaire Score:     Knowledge Questionnaire Score - 08/01/16 0842      Knowledge Questionnaire Score   Pre Score 20/28  Reviewed test results and answers with Kyung Anderson today      Core Components/Risk Factors/Patient Goals at Admission:     Personal Goals and Risk Factors at Admission - 07/30/16 1246      Core Components/Risk Factors/Patient Goals on Admission    Weight Management Yes   Intervention Weight Management: Develop a combined nutrition and exercise program designed to reach desired caloric intake, while maintaining appropriate intake of nutrient and fiber, sodium and fats, and appropriate energy expenditure required for the weight goal.;Weight Management: Provide education and appropriate resources to help participant work on and attain dietary goals.;Weight Management/Obesity: Establish reasonable short term and long term weight goals.;Obesity: Provide education and appropriate resources to help participant work on and attain dietary goals.   Admit Weight 238 lb 3.2 oz (108 kg)   Goal Weight: Short Term 233 lb (105.7 kg)   Goal Weight: Long Term 200 lb (90.7 kg)  weight one year ago was 220lbs   Expected Outcomes Short Term: Continue to assess and modify interventions until short term weight is achieved;Weight Loss: Understanding of general recommendations for a balanced deficit meal plan, which promotes 1-2 lb weight loss per week and includes a negative energy balance of [phone] kcal/d;Long Term: Adherence to nutrition and physical activity/exercise program aimed toward attainment of established weight goal;Understanding recommendations for meals to include 15-35% energy as protein, 25-35% energy from fat, 35-60% energy from carbohydrates, less than 265m  of dietary cholesterol, 20-35 gm of total fiber daily;Understanding of distribution of calorie intake throughout the day with the consumption of 4-5 meals/snacks   Sedentary Yes   Intervention Provide advice, education, support and counseling about physical activity/exercise needs.;Develop an individualized exercise prescription for aerobic and resistive training based on initial evaluation findings, risk stratification, comorbidities and participant's personal goals.   Expected Outcomes Achievement of increased cardiorespiratory fitness and enhanced flexibility, muscular endurance and strength shown through measurements of functional capacity and personal statement of participant.   Increase Strength and Stamina Yes   Intervention Provide advice, education, support and counseling about physical activity/exercise needs.;Develop an individualized exercise prescription for aerobic and resistive training based on initial evaluation findings, risk stratification, comorbidities and participant's personal goals.   Expected Outcomes Achievement of increased cardiorespiratory fitness and enhanced flexibility, muscular endurance and strength shown through measurements of functional capacity and personal statement of participant.   Tobacco Cessation Yes   Intervention Assist the participant in steps to quit. Provide individualized education and counseling about committing to Tobacco Cessation, relapse prevention, and pharmacological support that can be provided by physician.;OAdvice worker assist with locating and accessing local/national Quit Smoking programs, and support quit date choice.   Expected Outcomes Short Term: Will demonstrate readiness to quit, by selecting a quit date.;Short Term: Will quit all tobacco product use, adhering to prevention of relapse plan.;Long Term: Complete abstinence from all tobacco products for at least 12 months from quit date.   Hypertension Yes   Intervention  Provide education on  lifestyle modifcations including regular physical activity/exercise, weight management, moderate sodium restriction and increased consumption of fresh fruit, vegetables, and low fat dairy, alcohol moderation, and smoking cessation.;Monitor prescription use compliance.   Expected Outcomes Short Term: Continued assessment and intervention until BP is < 140/65m HG in hypertensive participants. < 130/871mHG in hypertensive participants with diabetes, heart failure or chronic kidney disease.;Long Term: Maintenance of blood pressure at goal levels.   Lipids Yes   Intervention Provide education and support for participant on nutrition & aerobic/resistive exercise along with prescribed medications to achieve LDL <7017mHDL >77m22m Expected Outcomes Short Term: Participant states understanding of desired cholesterol values and is compliant with medications prescribed. Participant is following exercise prescription and nutrition guidelines.;Long Term: Cholesterol controlled with medications as prescribed, with individualized exercise RX and with personalized nutrition plan. Value goals: LDL < 70mg71mL > 40 mg.      Core Components/Risk Factors/Patient Goals Review:    Core Components/Risk Factors/Patient Goals at Discharge (Final Review):    ITP Comments:     ITP Comments    Row Name 07/30/16 1245 07/30/16 1249 08/03/16 0819 08/22/16 0559 09/10/16 1615   ITP Comments Shaun Anderson he has rejoined the [employer] with his son and is playing some basketball with him.  ITP was created during Medical REview today. DX [facility] 05/08/2016 OP visit Appointment made to meet with RD on 08/20/2016 30 day review. Continue with ITP unless directed changes per Medical Director review. Called to check on status of return to program.  LMOM [place] 09/19/16 0543           ITP Comments 30 day review. Continue with ITP unless directed changes per Medical Director review          Comments:

## 2016-09-21 ENCOUNTER — Encounter: Payer: BC Managed Care – PPO | Attending: Internal Medicine

## 2016-09-21 DIAGNOSIS — Z9889 Other specified postprocedural states: Secondary | ICD-10-CM | POA: Insufficient documentation

## 2016-09-21 DIAGNOSIS — Z48812 Encounter for surgical aftercare following surgery on the circulatory system: Secondary | ICD-10-CM | POA: Insufficient documentation

## 2016-09-25 ENCOUNTER — Telehealth: Payer: Self-pay | Admitting: *Deleted

## 2016-09-25 ENCOUNTER — Encounter: Payer: Self-pay | Admitting: *Deleted

## 2016-09-25 DIAGNOSIS — Z9861 Coronary angioplasty status: Secondary | ICD-10-CM

## 2016-09-25 NOTE — Telephone Encounter (Signed)
Called to check on status.  LMOM

## 2016-10-09 ENCOUNTER — Encounter: Payer: Self-pay | Admitting: *Deleted

## 2016-10-09 ENCOUNTER — Telehealth: Payer: Self-pay | Admitting: *Deleted

## 2016-10-09 DIAGNOSIS — Z9861 Coronary angioplasty status: Secondary | ICD-10-CM

## 2016-10-09 NOTE — Telephone Encounter (Signed)
Called to check on status of return.  Out since 09/24/16.  LMOM.

## 2016-10-12 ENCOUNTER — Other Ambulatory Visit: Payer: Self-pay | Admitting: Internal Medicine

## 2016-10-12 DIAGNOSIS — D751 Secondary polycythemia: Secondary | ICD-10-CM

## 2016-10-12 DIAGNOSIS — E785 Hyperlipidemia, unspecified: Secondary | ICD-10-CM

## 2016-10-12 DIAGNOSIS — R221 Localized swelling, mass and lump, neck: Secondary | ICD-10-CM

## 2016-10-17 ENCOUNTER — Encounter: Payer: Self-pay | Admitting: *Deleted

## 2016-10-17 DIAGNOSIS — Z9861 Coronary angioplasty status: Secondary | ICD-10-CM

## 2016-10-17 NOTE — Progress Notes (Signed)
Cardiac Individual Treatment Plan  Patient Details  Name: Shaun Anderson MRN: [id number] Date of Birth: 13-Mar-1961 Referring Provider:     Cardiac Rehab from 07/30/2016 in [facility] Cardiac and Pulmonary Rehab  Referring Provider  Serafina Royals MD      Initial Encounter Date:    Cardiac Rehab from 07/30/2016 in [facility] Cardiac and Pulmonary Rehab  Date  07/30/16  Referring Provider  Serafina Royals MD      Visit Diagnosis: S/P PTCA (percutaneous transluminal coronary angioplasty)  Patient's Home Medications on Admission:  Current Outpatient Prescriptions:  .  aspirin EC 81 MG tablet, Take 81 mg by mouth daily., Disp: , Rfl:  .  atorvastatin (LIPITOR) 80 MG tablet, Take 1 tablet (80 mg total) by mouth daily., Disp: 90 tablet, Rfl: 4 .  clopidogrel (PLAVIX) 75 MG tablet, Take 1 tablet (75 mg total) by mouth daily., Disp: 90 tablet, Rfl: 4 .  isosorbide mononitrate (IMDUR) 30 MG 24 hr tablet, Take 30 mg by mouth daily., Disp: , Rfl:  .  metoprolol succinate (TOPROL XL) 25 MG 24 hr tablet, Take 1 tablet (25 mg total) by mouth daily., Disp: 90 tablet, Rfl: 4  Past Medical History: Past Medical History:  Diagnosis Date  . Hyperlipidemia     Tobacco Use: History  Smoking Status  . Current Every Day Smoker  . Packs/day: 1.00  . Types: Cigarettes  Smokeless Tobacco  . Never Used    Labs: Recent Review Flowsheet Data    There is no flowsheet data to display.       Exercise Target Goals:    Exercise Program Goal: Individual exercise prescription set with THRR, safety & activity barriers. Participant demonstrates ability to understand and report RPE using BORG scale, to self-measure pulse accurately, and to acknowledge the importance of the exercise prescription.  Exercise Prescription Goal: Starting with aerobic activity 30 plus minutes a day, 3 days per week for initial exercise prescription. Provide home exercise prescription and guidelines that participant acknowledges  understanding prior to discharge.  Activity Barriers & Risk Stratification:     Activity Barriers & Cardiac Risk Stratification - 07/30/16 1242      Activity Barriers & Cardiac Risk Stratification   Activity Barriers None   Cardiac Risk Stratification High      6 Minute Walk:     6 Minute Walk    Row Name 07/30/16 1440         6 Minute Walk   Phase Initial     Distance 1540 feet     Walk Time 6 minutes     # of Rest Breaks 0     MPH 2.92     METS 3.82     RPE 8     VO2 Peak 13.37     Symptoms No     Resting HR 67 bpm     Resting BP 128/74     Max Ex. HR 110 bpm     Max Ex. BP 136/64     2 Minute Post BP 118/60        Oxygen Initial Assessment:   Oxygen Re-Evaluation:   Oxygen Discharge (Final Oxygen Re-Evaluation):   Initial Exercise Prescription:     Initial Exercise Prescription - 07/30/16 1400      Date of Initial Exercise RX and Referring Provider   Date 07/30/16   Referring Provider Serafina Royals MD     Treadmill   MPH 2.8   Grade 1   Minutes 15  METs 3.53     Elliptical   Level 1   Speed 3.7   Minutes 15     T5 Nustep   Level 3   SPM --  80-100 spm   Minutes 15   METs 2     Prescription Details   Frequency (times per week) 3   Duration Progress to 45 minutes of aerobic exercise without signs/symptoms of physical distress     Intensity   THRR 40-80% of Max Heartrate 106-145   Ratings of Perceived Exertion 11-15   Perceived Dyspnea 0-4     Progression   Progression Continue to progress workloads to maintain intensity without signs/symptoms of physical distress.     Resistance Training   Training Prescription Yes   Weight 4 lbs   Reps 10-15      Perform Capillary Blood Glucose checks as needed.  Exercise Prescription Changes:     Exercise Prescription Changes    Row Name 07/30/16 1400 08/01/16 1300 08/06/16 0800 08/14/16 1400 08/27/16 1500     Response to Exercise   Blood Pressure (Admit) 128/74 126/64  -  114/56 126/60   Blood Pressure (Exercise) 136/64 136/74  - 138/70 146/72   Blood Pressure (Exit) 118/60 136/80  - 132/64 128/72   Heart Rate (Admit) 69 bpm 92 bpm  - 73 bpm 79 bpm   Heart Rate (Exercise) 110 bpm 103 bpm  - 98 bpm 103 bpm   Heart Rate (Exit) 72 bpm 67 bpm  - 70 bpm 76 bpm   Oxygen Saturation (Admit) 99 %  -  -  -  -   Oxygen Saturation (Exercise) 98 %  -  -  -  -   Rating of Perceived Exertion (Exercise) 8 13  - 15 12   Symptoms _0    Comments  -  - Home Exercise Guidelines given 08/06/16 Home Exercise Guidelines given 08/06/16 Home Exercise Guidelines given 08/06/16   Duration  - Progress to 45 minutes of aerobic exercise without signs/symptoms of physical distress Progress to 45 minutes of aerobic exercise without signs/symptoms of physical distress Progress to 45 minutes of aerobic exercise without signs/symptoms of physical distress Progress to 45 minutes of aerobic exercise without signs/symptoms of physical distress   Intensity  - THRR unchanged THRR unchanged THRR unchanged THRR unchanged     Progression   Progression  - Continue to progress workloads to maintain intensity without signs/symptoms of physical distress. Continue to progress workloads to maintain intensity without signs/symptoms of physical distress. Continue to progress workloads to maintain intensity without signs/symptoms of physical distress. Continue to progress workloads to maintain intensity without signs/symptoms of physical distress.   Average METs  - 2.81 2.81 4.2 6.3     Resistance Training   Training Prescription  - Yes Yes Yes Yes   Weight  - 4 lbs 4 lbs 4 lbs 5 lbs   Reps  - 10-15 10-15 10-15 10-15     Interval Training   Interval Training  - No No No Yes   Equipment  -  -  -  - Treadmill;T5 Nustep;Elliptical   Comments  -  -  -  - 2 min off 2 min on     Treadmill   MPH  - 2.8 2.8 3.3 3.3   Grade  - _1 Minutes  - _2 METs  - 3.53 3.53 4.89 7.2      Elliptical  Level  -  -  - 2 2   Speed  -  -  - 4.8 4.8   Minutes  -  -  - 15 15     T5 Nustep   Level  - _0 SPM  - -  122 spm -  122 spm  -  -   Minutes  - _1 METs  - 2.1 2.1 3.5 5.4     Home Exercise Plan   Plans to continue exercise at  -  - [employer] (comment)  walking and basketball at the [employer] (comment)  walking and basketball at the [employer] (comment)  walking and basketball at the [facility]   Frequency  -  - Add 2 additional days to program exercise sessions. Add 2 additional days to program exercise sessions. Add 2 additional days to program exercise sessions.     Exercise Review   Progression -  walk test results -  First Full Day of Exercise  - Yes Yes      Exercise Comments:     Exercise Comments    Row Name 07/30/16 1445 08/01/16 0851 08/06/16 0851 08/13/16 0914 08/14/16 1448   Exercise Comments Exercise goals are to increase his energy levels. First full day of exercise!  Patient was oriented to gym and equipment including functions, settings, policies, and procedures.  Patient's individual exercise prescription and treatment plan were reviewed.  All starting workloads were established based on the results of the 6 minute walk test done at initial orientation visit.  The plan for exercise progression was also introduced and progression will be customized based on patient's performance and goals. Reviewed METs average and discussed progression with pt today.  Reviewed home exercise with pt today.  Pt plans to walking at home and playing basketball at the [facility] for exercise.  Reviewed THR, pulse, RPE, sign and symptoms, NTG use, and when to call 911 or MD.  Also discussed weather considerations and indoor options.  Pt voiced understanding. Started intervals today on the NuStep! Ray is off to a good start in rehab.  He likes the challenge of the intervals.  He is also up to level 2 on the Ellpitical.  We will continue  to monitor his progression.   Gregory Name 08/27/16 1530 09/10/16 1615         Exercise Comments Ray continues to do well with exercise.  He tried to do intervals on the ellpitical some today.  We will continue to monitor his progress. Out since last review, left voicemail.         Exercise Goals and Review:   Exercise Goals Re-Evaluation :   Discharge Exercise Prescription (Final Exercise Prescription Changes):     Exercise Prescription Changes - 08/27/16 1500      Response to Exercise   Blood Pressure (Admit) 126/60   Blood Pressure (Exercise) 146/72   Blood Pressure (Exit) 128/72   Heart Rate (Admit) 79 bpm   Heart Rate (Exercise) 103 bpm   Heart Rate (Exit) 76 bpm   Rating of Perceived Exertion (Exercise) 12   Symptoms none   Comments Home Exercise Guidelines given 08/06/16   Duration Progress to 45 minutes of aerobic exercise without signs/symptoms of physical distress   Intensity THRR unchanged     Progression   Progression Continue to progress workloads to maintain intensity without signs/symptoms of physical distress.   Average METs 6.3  Resistance Training   Training Prescription Yes   Weight 5 lbs   Reps 10-15     Interval Training   Interval Training Yes   Equipment Treadmill;T5 Nustep;Elliptical   Comments 2 min off 2 min on     Treadmill   MPH 3.3   Grade 8   Minutes 15   METs 7.2     Elliptical   Level 2   Speed 4.8   Minutes 15     T5 Nustep   Level 5   Minutes 15   METs 5.4     Home Exercise Plan   Plans to continue exercise at [employer] (comment)  walking and basketball at the [facility]   Frequency Add 2 additional days to program exercise sessions.     Exercise Review   Progression Yes      Nutrition:  Target Goals: Understanding of nutrition guidelines, daily intake of sodium <1575m, cholesterol <2040m calories 30% from fat and 7% or less from saturated fats, daily to have 5 or more servings of fruits and  vegetables.  Biometrics:     Pre Biometrics - 07/30/16 1447      Pre Biometrics   Height 5' 8.8" (1.748 m)   Weight 238 lb 3.2 oz (108 kg)   Waist Circumference 45 inches   Hip Circumference 41.5 inches   Waist to Hip Ratio 1.08 %   BMI (Calculated) 35.5   Single Leg Stand 25.28 seconds       Nutrition Therapy Plan and Nutrition Goals:     Nutrition Therapy & Goals - 08/20/16 1537      Nutrition Therapy   Diet No show for dietitian appointment.      Nutrition Discharge: Rate Your Plate Scores:     Nutrition Assessments - 07/30/16 1341      MEDFICTS Scores   Pre Score 85      Nutrition Goals Re-Evaluation:   Nutrition Goals Discharge (Final Nutrition Goals Re-Evaluation):   Psychosocial: Target Goals: Acknowledge presence or absence of significant depression and/or stress, maximize coping skills, provide positive support system. Participant is able to verbalize types and ability to use techniques and skills needed for reducing stress and depression.   Initial Review & Psychosocial Screening:     Initial Psych Review & Screening - 07/30/16 1248      Barriers   Psychosocial barriers to participate in program The patient should benefit from training in stress management and relaxation.     Screening Interventions   Interventions Program counselor consult      Quality of Life Scores:      Quality of Life - 07/30/16 1248      Quality of Life Scores   Health/Function Pre 17 %   Socioeconomic Pre 25.71 %   Psych/Spiritual Pre 24.86 %   Family Pre 25.2 %   GLOBAL Pre 21.62 %      PHQ-9: Recent Review Flowsheet Data    [facility] 07/30/2016   Decreased Interest 0   Down, Depressed, Hopeless 0   PHQ - 2 Score 0   Altered sleeping 1   Tired, decreased energy 1   Change in appetite 1   Feeling bad or failure about yourself  0   Trouble concentrating 0   Moving slowly or fidgety/restless 0   Suicidal thoughts 0   PHQ-9 Score 3    Difficult doing work/chores Not difficult at all     Interpretation of Total Score  Total Score Depression  Severity:  1-4 = Minimal depression, 5-9 = Mild depression, 10-14 = Moderate depression, 15-19 = Moderately severe depression, 20-27 = Severe depression   Psychosocial Evaluation and Intervention:     Psychosocial Evaluation - 08/13/16 0926      Psychosocial Evaluation & Interventions   Interventions Encouraged to exercise with the program and follow exercise prescription   Comments Counselor met with Mr. Jarboe today for initial psychosocial evaluation.  He is a 56 year old who had a heart attack and (4) stents inserted in October, 2017.  He has a spouse of 1 years and a teenaged son who live with him.  Mr. D also has other family support who live close by.  He states he has been sleeping better since beginning this program several weeks ago.  His appetite is good and he denies a history of depression or anxity or any current symptoms.  Mr. D reports minimal stress in his life and states he is typically in a positive mood most of the time.  He has goals to lose weight, increase his energy and his stamina while in this program.  [facility] with his son more often upon discharge from this program.  Staff will continue to follow with Mr. D throughout the course of this program.        Psychosocial Re-Evaluation:   Psychosocial Discharge (Final Psychosocial Re-Evaluation):   Vocational Rehabilitation: Provide vocational rehab assistance to qualifying candidates.   Vocational Rehab Evaluation & Intervention:     Vocational Rehab - 07/30/16 1244      Initial Vocational Rehab Evaluation & Intervention   Assessment shows need for Vocational Rehabilitation No      Education: Education Goals: Education classes will be provided on a weekly basis, covering required topics. Participant will state understanding/return demonstration of topics  presented.  Learning Barriers/Preferences:     Learning Barriers/Preferences - 07/30/16 1243      Learning Barriers/Preferences   Learning Barriers None   Learning Preferences None      Education Topics: General Nutrition Guidelines/Fats and Fiber: -Group instruction provided by verbal, written material, models and posters to present the general guidelines for heart healthy nutrition. Gives an explanation and review of dietary fats and fiber.   Controlling Sodium/Reading Food Labels: -Group verbal and written material supporting the discussion of sodium use in heart healthy nutrition. Review and explanation with models, verbal and written materials for utilization of the food label.   Cardiac Rehab from 08/27/2016 in [facility] Cardiac and Pulmonary Rehab  Date  08/06/16  Educator  CR  Instruction Review Code  2- meets goals/outcomes      Exercise Physiology & Risk Factors: - Group verbal and written instruction with models to review the exercise physiology of the cardiovascular system and associated critical values. Details cardiovascular disease risk factors and the goals associated with each risk factor.   Cardiac Rehab from 08/27/2016 in [facility] Cardiac and Pulmonary Rehab  Date  08/13/16  Educator  [facility]  Instruction Review Code  2- meets goals/outcomes      Aerobic Exercise & Resistance Training: - Gives group verbal and written discussion on the health impact of inactivity. On the components of aerobic and resistive training programs and the benefits of this training and how to safely progress through these programs.   Flexibility, Balance, General Exercise Guidelines: - Provides group verbal and written instruction on the benefits of flexibility and balance training programs. Provides general exercise  guidelines with specific guidelines to those with heart or lung disease. Demonstration and skill practice provided.   Stress Management: - Provides group verbal and written  instruction about the health risks of elevated stress, cause of high stress, and healthy ways to reduce stress.   Depression: - Provides group verbal and written instruction on the correlation between heart/lung disease and depressed mood, treatment options, and the stigmas associated with seeking treatment.   Cardiac Rehab from 08/27/2016 in [facility] Cardiac and Pulmonary Rehab  Date  08/01/16  Educator  [facility]  Instruction Review Code  2- meets goals/outcomes      Anatomy & Physiology of the Heart: - Group verbal and written instruction and models provide basic cardiac anatomy and physiology, with the coronary electrical and arterial systems. Review of: AMI, Angina, Valve disease, Heart Failure, Cardiac Arrhythmia, Pacemakers, and the ICD.   Cardiac Rehab from 08/27/2016 in [facility] Cardiac and Pulmonary Rehab  Date  08/27/16  Educator  MA  Instruction Review Code  2- meets goals/outcomes      Cardiac Procedures: - Group verbal and written instruction and models to describe the testing methods done to diagnose heart disease. Reviews the outcomes of the test results. Describes the treatment choices: Medical Management, Angioplasty, or Coronary Bypass Surgery.   Cardiac Medications: - Group verbal and written instruction to review commonly prescribed medications for heart disease. Reviews the medication, class of the drug, and side effects. Includes the steps to properly store meds and maintain the prescription regimen.   Go Sex-Intimacy & Heart Disease, Get SMART - Goal Setting: - Group verbal and written instruction through game format to discuss heart disease and the return to sexual intimacy. Provides group verbal and written material to discuss and apply goal setting through the application of the S.M.A.R.T. Method.   Other Matters of the Heart: - Provides group verbal, written materials and models to describe Heart Failure, Angina, Valve Disease, and Diabetes in the realm of heart disease.  Includes description of the disease process and treatment options available to the cardiac patient.   Cardiac Rehab from 08/27/2016 in [facility] Cardiac and Pulmonary Rehab  Date  08/27/16  Educator  MA  Instruction Review Code  2- meets goals/outcomes      Exercise & Equipment Safety: - Individual verbal instruction and demonstration of equipment use and safety with use of the equipment.   Cardiac Rehab from 08/27/2016 in [facility] Cardiac and Pulmonary Rehab  Date  07/30/16  Educator  C. EnterkinRN  Instruction Review Code  1- partially meets, needs review/practice      Infection Prevention: - Provides verbal and written material to individual with discussion of infection control including proper hand washing and proper equipment cleaning during exercise session.   Cardiac Rehab from 08/27/2016 in [facility] Cardiac and Pulmonary Rehab  Date  07/30/16  Educator  C. EnterkinRN  Instruction Review Code  2- meets goals/outcomes      Falls Prevention: - Provides verbal and written material to individual with discussion of falls prevention and safety.   Cardiac Rehab from 08/27/2016 in [facility] Cardiac and Pulmonary Rehab  Date  07/30/16  Educator  C. ENterkinRN  Instruction Review Code  2- meets goals/outcomes      Diabetes: - Individual verbal and written instruction to review signs/symptoms of diabetes, desired ranges of glucose level fasting, after meals and with exercise. Advice that pre and post exercise glucose checks will be done for 3 sessions at entry of program.    Knowledge Questionnaire Score:  Knowledge Questionnaire Score - 08/01/16 0842      Knowledge Questionnaire Score   Pre Score 20/28  Reviewed test results and answers with Kyung Rudd today      Core Components/Risk Factors/Patient Goals at Admission:     Personal Goals and Risk Factors at Admission - 07/30/16 1246      Core Components/Risk Factors/Patient Goals on Admission    Weight Management Yes   Intervention  Weight Management: Develop a combined nutrition and exercise program designed to reach desired caloric intake, while maintaining appropriate intake of nutrient and fiber, sodium and fats, and appropriate energy expenditure required for the weight goal.;Weight Management: Provide education and appropriate resources to help participant work on and attain dietary goals.;Weight Management/Obesity: Establish reasonable short term and long term weight goals.;Obesity: Provide education and appropriate resources to help participant work on and attain dietary goals.   Admit Weight 238 lb 3.2 oz (108 kg)   Goal Weight: Short Term 233 lb (105.7 kg)   Goal Weight: Long Term 200 lb (90.7 kg)  weight one year ago was 220lbs   Expected Outcomes Short Term: Continue to assess and modify interventions until short term weight is achieved;Weight Loss: Understanding of general recommendations for a balanced deficit meal plan, which promotes 1-2 lb weight loss per week and includes a negative energy balance of [phone] kcal/d;Long Term: Adherence to nutrition and physical activity/exercise program aimed toward attainment of established weight goal;Understanding recommendations for meals to include 15-35% energy as protein, 25-35% energy from fat, 35-60% energy from carbohydrates, less than 281m of dietary cholesterol, 20-35 gm of total fiber daily;Understanding of distribution of calorie intake throughout the day with the consumption of 4-5 meals/snacks   Sedentary Yes   Intervention Provide advice, education, support and counseling about physical activity/exercise needs.;Develop an individualized exercise prescription for aerobic and resistive training based on initial evaluation findings, risk stratification, comorbidities and participant's personal goals.   Expected Outcomes Achievement of increased cardiorespiratory fitness and enhanced flexibility, muscular endurance and strength shown through measurements of functional  capacity and personal statement of participant.   Increase Strength and Stamina Yes   Intervention Provide advice, education, support and counseling about physical activity/exercise needs.;Develop an individualized exercise prescription for aerobic and resistive training based on initial evaluation findings, risk stratification, comorbidities and participant's personal goals.   Expected Outcomes Achievement of increased cardiorespiratory fitness and enhanced flexibility, muscular endurance and strength shown through measurements of functional capacity and personal statement of participant.   Tobacco Cessation Yes   Intervention Assist the participant in steps to quit. Provide individualized education and counseling about committing to Tobacco Cessation, relapse prevention, and pharmacological support that can be provided by physician.;OAdvice worker assist with locating and accessing local/national Quit Smoking programs, and support quit date choice.   Expected Outcomes Short Term: Will demonstrate readiness to quit, by selecting a quit date.;Short Term: Will quit all tobacco product use, adhering to prevention of relapse plan.;Long Term: Complete abstinence from all tobacco products for at least 12 months from quit date.   Hypertension Yes   Intervention Provide education on lifestyle modifcations including regular physical activity/exercise, weight management, moderate sodium restriction and increased consumption of fresh fruit, vegetables, and low fat dairy, alcohol moderation, and smoking cessation.;Monitor prescription use compliance.   Expected Outcomes Short Term: Continued assessment and intervention until BP is < 140/984mHG in hypertensive participants. < 130/809mG in hypertensive participants with diabetes, heart failure or chronic kidney disease.;Long Term: Maintenance of blood pressure at goal levels.  Lipids Yes   Intervention Provide education and support for participant  on nutrition & aerobic/resistive exercise along with prescribed medications to achieve LDL <62m, HDL >454m   Expected Outcomes Short Term: Participant states understanding of desired cholesterol values and is compliant with medications prescribed. Participant is following exercise prescription and nutrition guidelines.;Long Term: Cholesterol controlled with medications as prescribed, with individualized exercise RX and with personalized nutrition plan. Value goals: LDL < 7062mHDL > 40 mg.      Core Components/Risk Factors/Patient Goals Review:    Core Components/Risk Factors/Patient Goals at Discharge (Final Review):    ITP Comments:     ITP Comments    Row Name 07/30/16 1245 07/30/16 1249 08/03/16 0819 08/22/16 0559 09/10/16 1615   ITP Comments RayKyung Ruddid he has rejoined the [employer] with his son and is playing some basketball with him.  ITP was created during Medical REview today. DX [facility] 05/08/2016 OP visit Appointment made to meet with RD on 08/20/2016 30 day review. Continue with ITP unless directed changes per Medical Director review. Called to check on status of return to program.  LMOYanceyme 09/19/16 0543 09/25/16 1531 10/09/16 1508 10/17/16 0541     ITP Comments 30 day review. Continue with ITP unless directed changes per Medical Director review Last attended session was 08/27/16.  Called to check on status.  LMOM.  Called to check on status of return.  Out since 08/27/16.  LMOM.  30 day review. Continue with ITP unless directed changes per Medical Director review       Comments:

## 2016-10-19 ENCOUNTER — Ambulatory Visit
Admission: RE | Admit: 2016-10-19 | Discharge: 2016-10-19 | Disposition: A | Discharge status: Home / Self Care | Payer: BC Managed Care – PPO | Source: Ambulatory Visit | Attending: Internal Medicine | Admitting: Internal Medicine

## 2016-10-19 DIAGNOSIS — E785 Hyperlipidemia, unspecified: Secondary | ICD-10-CM

## 2016-10-19 DIAGNOSIS — D751 Secondary polycythemia: Secondary | ICD-10-CM | POA: Diagnosis present

## 2016-10-19 DIAGNOSIS — R221 Localized swelling, mass and lump, neck: Secondary | ICD-10-CM

## 2016-10-19 DIAGNOSIS — I10 Essential (primary) hypertension: Secondary | ICD-10-CM | POA: Diagnosis not present

## 2016-10-19 DIAGNOSIS — E782 Mixed hyperlipidemia: Secondary | ICD-10-CM | POA: Diagnosis present

## 2016-10-19 HISTORY — DX: Essential (primary) hypertension: I10

## 2016-10-19 MED ORDER — IOPAMIDOL (ISOVUE-300) INJECTION 61%
75.0000 mL | Freq: Once | INTRAVENOUS | Status: AC | PRN
  Administered 2016-10-19: 75 mL via INTRAVENOUS

## 2016-10-22 ENCOUNTER — Encounter: Payer: Self-pay | Admitting: *Deleted

## 2016-10-22 ENCOUNTER — Encounter: Payer: BC Managed Care – PPO | Attending: Internal Medicine

## 2016-10-22 DIAGNOSIS — Z48812 Encounter for surgical aftercare following surgery on the circulatory system: Secondary | ICD-10-CM | POA: Insufficient documentation

## 2016-10-22 DIAGNOSIS — Z9861 Coronary angioplasty status: Secondary | ICD-10-CM

## 2016-10-22 DIAGNOSIS — Z9889 Other specified postprocedural states: Secondary | ICD-10-CM | POA: Insufficient documentation

## 2016-10-22 NOTE — Progress Notes (Signed)
Discharge Summary  Patient Details  Name: Shaun Anderson MRN: [id number] Date of Birth: 06/22/1961 Referring Provider:     Cardiac Rehab from 07/30/2016 in [facility]  Referring Provider  Serafina Royals MD       Number of Visits: 11/36  Reason for Discharge:  Early Exit:  Personal  Smoking History:  History  Smoking Status  . Current Every Day Smoker  . Packs/day: 1.00  . Types: Cigarettes  Smokeless Tobacco  . Never Used    Diagnosis:  S/P PTCA (percutaneous transluminal coronary angioplasty)  ADL UCSD:   Initial Exercise Prescription:     Initial Exercise Prescription - 07/30/16 1400      Date of Initial Exercise RX and Referring Provider   Date 07/30/16   Referring Provider Serafina Royals MD     Treadmill   MPH 2.8   Grade 1   Minutes 15   METs 3.53     Elliptical   Level 1   Speed 3.7   Minutes 15     T5 Nustep   Level 3   SPM --  80-100 spm   Minutes 15   METs 2     Prescription Details   Frequency (times per week) 3   Duration Progress to 45 minutes of aerobic exercise without signs/symptoms of physical distress     Intensity   THRR 40-80% of Max Heartrate 106-145   Ratings of Perceived Exertion 11-15   Perceived Dyspnea 0-4     Progression   Progression Continue to progress workloads to maintain intensity without signs/symptoms of physical distress.     Resistance Training   Training Prescription Yes   Weight 4 lbs   Reps 10-15      Discharge Exercise Prescription (Final Exercise Prescription Changes):     Exercise Prescription Changes - 08/27/16 1500      Response to Exercise   Blood Pressure (Admit) 126/60   Blood Pressure (Exercise) 146/72   Blood Pressure (Exit) 128/72   Heart Rate (Admit) 79 bpm   Heart Rate (Exercise) 103 bpm   Heart Rate (Exit) 76 bpm   Rating of Perceived Exertion (Exercise) 12   Symptoms none   Comments Home Exercise Guidelines given 08/06/16   Duration Progress to 45  minutes of aerobic exercise without signs/symptoms of physical distress   Intensity THRR unchanged     Progression   Progression Continue to progress workloads to maintain intensity without signs/symptoms of physical distress.   Average METs 6.3     Resistance Training   Training Prescription Yes   Weight 5 lbs   Reps 10-15     Interval Training   Interval Training Yes   Equipment Treadmill;T5 Nustep;Elliptical   Comments 2 min off 2 min on     Treadmill   MPH 3.3   Grade 8   Minutes 15   METs 7.2     Elliptical   Level 2   Speed 4.8   Minutes 15     T5 Nustep   Level 5   Minutes 15   METs 5.4     Home Exercise Plan   Plans to continue exercise at [employer] (comment)  walking and basketball at the [facility]   Frequency Add 2 additional days to program exercise sessions.     Exercise Review   Progression Yes      Functional Capacity:     6 Minute Walk    Row Name 07/30/16 1440  6 Minute Walk   Phase Initial     Distance 1540 feet     Walk Time 6 minutes     # of Rest Breaks 0     MPH 2.92     METS 3.82     RPE 8     VO2 Peak 13.37     Symptoms No     Resting HR 67 bpm     Resting BP 128/74     Max Ex. HR 110 bpm     Max Ex. BP 136/64     2 Minute Post BP 118/60        Psychological, QOL, Others - Outcomes: PHQ 2/9: Depression screen PHQ 2/9 07/30/2016  Decreased Interest 0  Down, Depressed, Hopeless 0  PHQ - 2 Score 0  Altered sleeping 1  Tired, decreased energy 1  Change in appetite 1  Feeling bad or failure about yourself  0  Trouble concentrating 0  Moving slowly or fidgety/restless 0  Suicidal thoughts 0  PHQ-9 Score 3  Difficult doing work/chores Not difficult at all    Quality of Life:     Quality of Life - 07/30/16 1248      Quality of Life Scores   Health/Function Pre 17 %   Socioeconomic Pre 25.71 %   Psych/Spiritual Pre 24.86 %   Family Pre 25.2 %   GLOBAL Pre 21.62 %      Personal Goals: Goals  established at orientation with interventions provided to work toward goal.     Personal Goals and Risk Factors at Admission - 07/30/16 1246      Core Components/Risk Factors/Patient Goals on Admission    Weight Management Yes   Intervention Weight Management: Develop a combined nutrition and exercise program designed to reach desired caloric intake, while maintaining appropriate intake of nutrient and fiber, sodium and fats, and appropriate energy expenditure required for the weight goal.;Weight Management: Provide education and appropriate resources to help participant work on and attain dietary goals.;Weight Management/Obesity: Establish reasonable short term and long term weight goals.;Obesity: Provide education and appropriate resources to help participant work on and attain dietary goals.   Admit Weight 238 lb 3.2 oz (108 kg)   Goal Weight: Short Term 233 lb (105.7 kg)   Goal Weight: Long Term 200 lb (90.7 kg)  weight one year ago was 220lbs   Expected Outcomes Short Term: Continue to assess and modify interventions until short term weight is achieved;Weight Loss: Understanding of general recommendations for a balanced deficit meal plan, which promotes 1-2 lb weight loss per week and includes a negative energy balance of [phone] kcal/d;Long Term: Adherence to nutrition and physical activity/exercise program aimed toward attainment of established weight goal;Understanding recommendations for meals to include 15-35% energy as protein, 25-35% energy from fat, 35-60% energy from carbohydrates, less than 200mg  of dietary cholesterol, 20-35 gm of total fiber daily;Understanding of distribution of calorie intake throughout the day with the consumption of 4-5 meals/snacks   Sedentary Yes   Intervention Provide advice, education, support and counseling about physical activity/exercise needs.;Develop an individualized exercise prescription for aerobic and resistive training based on initial evaluation  findings, risk stratification, comorbidities and participant's personal goals.   Expected Outcomes Achievement of increased cardiorespiratory fitness and enhanced flexibility, muscular endurance and strength shown through measurements of functional capacity and personal statement of participant.   Increase Strength and Stamina Yes   Intervention Provide advice, education, support and counseling about physical activity/exercise needs.;Develop an individualized exercise prescription for  aerobic and resistive training based on initial evaluation findings, risk stratification, comorbidities and participant's personal goals.   Expected Outcomes Achievement of increased cardiorespiratory fitness and enhanced flexibility, muscular endurance and strength shown through measurements of functional capacity and personal statement of participant.   Tobacco Cessation Yes   Intervention Assist the participant in steps to quit. Provide individualized education and counseling about committing to Tobacco Cessation, relapse prevention, and pharmacological support that can be provided by physician.;Advice worker, assist with locating and accessing local/national Quit Smoking programs, and support quit date choice.   Expected Outcomes Short Term: Will demonstrate readiness to quit, by selecting a quit date.;Short Term: Will quit all tobacco product use, adhering to prevention of relapse plan.;Long Term: Complete abstinence from all tobacco products for at least 12 months from quit date.   Hypertension Yes   Intervention Provide education on lifestyle modifcations including regular physical activity/exercise, weight management, moderate sodium restriction and increased consumption of fresh fruit, vegetables, and low fat dairy, alcohol moderation, and smoking cessation.;Monitor prescription use compliance.   Expected Outcomes Short Term: Continued assessment and intervention until BP is < 140/79mm HG in  hypertensive participants. < 130/16mm HG in hypertensive participants with diabetes, heart failure or chronic kidney disease.;Long Term: Maintenance of blood pressure at goal levels.   Lipids Yes   Intervention Provide education and support for participant on nutrition & aerobic/resistive exercise along with prescribed medications to achieve LDL 70mg , HDL >40mg .   Expected Outcomes Short Term: Participant states understanding of desired cholesterol values and is compliant with medications prescribed. Participant is following exercise prescription and nutrition guidelines.;Long Term: Cholesterol controlled with medications as prescribed, with individualized exercise RX and with personalized nutrition plan. Value goals: LDL < 70mg , HDL > 40 mg.       Personal Goals Discharge:   Nutrition & Weight - Outcomes:     Pre Biometrics - 07/30/16 1447      Pre Biometrics   Height 5' 8.8" (1.748 m)   Weight 238 lb 3.2 oz (108 kg)   Waist Circumference 45 inches   Hip Circumference 41.5 inches   Waist to Hip Ratio 1.08 %   BMI (Calculated) 35.5   Single Leg Stand 25.28 seconds       Nutrition:     Nutrition Therapy & Goals - 08/20/16 1537      Nutrition Therapy   Diet No show for dietitian appointment.      Nutrition Discharge:     Nutrition Assessments - 07/30/16 1341      MEDFICTS Scores   Pre Score 85      Education Questionnaire Score:     Knowledge Questionnaire Score - 08/01/16 0842      Knowledge Questionnaire Score   Pre Score 20/28  Reviewed test results and answers with Kyung Rudd today      Goals reviewed with patient; copy given to patient.

## 2016-10-22 NOTE — Progress Notes (Signed)
Cardiac Individual Treatment Plan  Patient Details  Name: Shaun Anderson MRN: [id number] Date of Birth: 09-16-1960 Referring Provider:     Cardiac Rehab from 07/30/2016 in [facility] Cardiac and Pulmonary Rehab  Referring Provider  Serafina Royals MD      Initial Encounter Date:    Cardiac Rehab from 07/30/2016 in [facility] Cardiac and Pulmonary Rehab  Date  07/30/16  Referring Provider  Serafina Royals MD      Visit Diagnosis: S/P PTCA (percutaneous transluminal coronary angioplasty)  Patient's Home Medications on Admission:  Current Outpatient Prescriptions:  .  aspirin EC 81 MG tablet, Take 81 mg by mouth daily., Disp: , Rfl:  .  atorvastatin (LIPITOR) 80 MG tablet, Take 1 tablet (80 mg total) by mouth daily., Disp: 90 tablet, Rfl: 4 .  clopidogrel (PLAVIX) 75 MG tablet, Take 1 tablet (75 mg total) by mouth daily., Disp: 90 tablet, Rfl: 4 .  isosorbide mononitrate (IMDUR) 30 MG 24 hr tablet, Take 30 mg by mouth daily., Disp: , Rfl:  .  metoprolol succinate (TOPROL XL) 25 MG 24 hr tablet, Take 1 tablet (25 mg total) by mouth daily., Disp: 90 tablet, Rfl: 4  Past Medical History: Past Medical History:  Diagnosis Date  . Hyperlipidemia   . Hypertension     Tobacco Use: History  Smoking Status  . Current Every Day Smoker  . Packs/day: 1.00  . Types: Cigarettes  Smokeless Tobacco  . Never Used    Labs: Recent Review Flowsheet Data    There is no flowsheet data to display.       Exercise Target Goals:    Exercise Program Goal: Individual exercise prescription set with THRR, safety & activity barriers. Participant demonstrates ability to understand and report RPE using BORG scale, to self-measure pulse accurately, and to acknowledge the importance of the exercise prescription.  Exercise Prescription Goal: Starting with aerobic activity 30 plus minutes a day, 3 days per week for initial exercise prescription. Provide home exercise prescription and guidelines that  participant acknowledges understanding prior to discharge.  Activity Barriers & Risk Stratification:     Activity Barriers & Cardiac Risk Stratification - 07/30/16 1242      Activity Barriers & Cardiac Risk Stratification   Activity Barriers None   Cardiac Risk Stratification High      6 Minute Walk:     6 Minute Walk    Row Name 07/30/16 1440         6 Minute Walk   Phase Initial     Distance 1540 feet     Walk Time 6 minutes     # of Rest Breaks 0     MPH 2.92     METS 3.82     RPE 8     VO2 Peak 13.37     Symptoms No     Resting HR 67 bpm     Resting BP 128/74     Max Ex. HR 110 bpm     Max Ex. BP 136/64     2 Minute Post BP 118/60        Oxygen Initial Assessment:   Oxygen Re-Evaluation:   Oxygen Discharge (Final Oxygen Re-Evaluation):   Initial Exercise Prescription:     Initial Exercise Prescription - 07/30/16 1400      Date of Initial Exercise RX and Referring Provider   Date 07/30/16   Referring Provider Serafina Royals MD     Treadmill   MPH 2.8   Grade 1  Minutes 15   METs 3.53     Elliptical   Level 1   Speed 3.7   Minutes 15     T5 Nustep   Level 3   SPM --  80-100 spm   Minutes 15   METs 2     Prescription Details   Frequency (times per week) 3   Duration Progress to 45 minutes of aerobic exercise without signs/symptoms of physical distress     Intensity   THRR 40-80% of Max Heartrate 106-145   Ratings of Perceived Exertion 11-15   Perceived Dyspnea 0-4     Progression   Progression Continue to progress workloads to maintain intensity without signs/symptoms of physical distress.     Resistance Training   Training Prescription Yes   Weight 4 lbs   Reps 10-15      Perform Capillary Blood Glucose checks as needed.  Exercise Prescription Changes:     Exercise Prescription Changes    Row Name 07/30/16 1400 08/01/16 1300 08/06/16 0800 08/14/16 1400 08/27/16 1500     Response to Exercise   Blood Pressure  (Admit) 128/74 126/64  - 114/56 126/60   Blood Pressure (Exercise) 136/64 136/74  - 138/70 146/72   Blood Pressure (Exit) 118/60 136/80  - 132/64 128/72   Heart Rate (Admit) 69 bpm 92 bpm  - 73 bpm 79 bpm   Heart Rate (Exercise) 110 bpm 103 bpm  - 98 bpm 103 bpm   Heart Rate (Exit) 72 bpm 67 bpm  - 70 bpm 76 bpm   Oxygen Saturation (Admit) 99 %  -  -  -  -   Oxygen Saturation (Exercise) 98 %  -  -  -  -   Rating of Perceived Exertion (Exercise) 8 13  - 15 12   Symptoms _0    Comments  -  - Home Exercise Guidelines given 08/06/16 Home Exercise Guidelines given 08/06/16 Home Exercise Guidelines given 08/06/16   Duration  - Progress to 45 minutes of aerobic exercise without signs/symptoms of physical distress Progress to 45 minutes of aerobic exercise without signs/symptoms of physical distress Progress to 45 minutes of aerobic exercise without signs/symptoms of physical distress Progress to 45 minutes of aerobic exercise without signs/symptoms of physical distress   Intensity  - THRR unchanged THRR unchanged THRR unchanged THRR unchanged     Progression   Progression  - Continue to progress workloads to maintain intensity without signs/symptoms of physical distress. Continue to progress workloads to maintain intensity without signs/symptoms of physical distress. Continue to progress workloads to maintain intensity without signs/symptoms of physical distress. Continue to progress workloads to maintain intensity without signs/symptoms of physical distress.   Average METs  - 2.81 2.81 4.2 6.3     Resistance Training   Training Prescription  - Yes Yes Yes Yes   Weight  - 4 lbs 4 lbs 4 lbs 5 lbs   Reps  - 10-15 10-15 10-15 10-15     Interval Training   Interval Training  - No No No Yes   Equipment  -  -  -  - Treadmill;T5 Nustep;Elliptical   Comments  -  -  -  - 2 min off 2 min on     Treadmill   MPH  - 2.8 2.8 3.3 3.3   Grade  - _1 Minutes  - _2 METs  -  3.53 3.53 4.89 7.2  Elliptical   Level  -  -  - 2 2   Speed  -  -  - 4.8 4.8   Minutes  -  -  - 15 15     T5 Nustep   Level  - _0 SPM  - -  122 spm -  122 spm  -  -   Minutes  - _1 METs  - 2.1 2.1 3.5 5.4     Home Exercise Plan   Plans to continue exercise at  -  - [employer] (comment)  walking and basketball at the [employer] (comment)  walking and basketball at the [employer] (comment)  walking and basketball at the [facility]   Frequency  -  - Add 2 additional days to program exercise sessions. Add 2 additional days to program exercise sessions. Add 2 additional days to program exercise sessions.     Exercise Review   Progression -  walk test results -  First Full Day of Exercise  - Yes Yes      Exercise Comments:     Exercise Comments    Row Name 07/30/16 1445 08/01/16 0851 08/06/16 0851 08/13/16 0914 08/14/16 1448   Exercise Comments Exercise goals are to increase his energy levels. First full day of exercise!  Patient was oriented to gym and equipment including functions, settings, policies, and procedures.  Patient's individual exercise prescription and treatment plan were reviewed.  All starting workloads were established based on the results of the 6 minute walk test done at initial orientation visit.  The plan for exercise progression was also introduced and progression will be customized based on patient's performance and goals. Reviewed METs average and discussed progression with pt today.  Reviewed home exercise with pt today.  Pt plans to walking at home and playing basketball at the [facility] for exercise.  Reviewed THR, pulse, RPE, sign and symptoms, NTG use, and when to call 911 or MD.  Also discussed weather considerations and indoor options.  Pt voiced understanding. Started intervals today on the NuStep! Ray is off to a good start in rehab.  He likes the challenge of the intervals.  He is also up to level 2 on the  Ellpitical.  We will continue to monitor his progression.   [place] Name 08/27/16 1530 09/10/16 1615         Exercise Comments Ray continues to do well with exercise.  He tried to do intervals on the ellpitical some today.  We will continue to monitor his progress. Out since last review, left voicemail.         Exercise Goals and Review:   Exercise Goals Re-Evaluation :   Discharge Exercise Prescription (Final Exercise Prescription Changes):     Exercise Prescription Changes - 08/27/16 1500      Response to Exercise   Blood Pressure (Admit) 126/60   Blood Pressure (Exercise) 146/72   Blood Pressure (Exit) 128/72   Heart Rate (Admit) 79 bpm   Heart Rate (Exercise) 103 bpm   Heart Rate (Exit) 76 bpm   Rating of Perceived Exertion (Exercise) 12   Symptoms none   Comments Home Exercise Guidelines given 08/06/16   Duration Progress to 45 minutes of aerobic exercise without signs/symptoms of physical distress   Intensity THRR unchanged     Progression   Progression Continue to progress workloads to maintain intensity without signs/symptoms of physical distress.   Average METs 6.3  Resistance Training   Training Prescription Yes   Weight 5 lbs   Reps 10-15     Interval Training   Interval Training Yes   Equipment Treadmill;T5 Nustep;Elliptical   Comments 2 min off 2 min on     Treadmill   MPH 3.3   Grade 8   Minutes 15   METs 7.2     Elliptical   Level 2   Speed 4.8   Minutes 15     T5 Nustep   Level 5   Minutes 15   METs 5.4     Home Exercise Plan   Plans to continue exercise at [employer] (comment)  walking and basketball at the [facility]   Frequency Add 2 additional days to program exercise sessions.     Exercise Review   Progression Yes      Nutrition:  Target Goals: Understanding of nutrition guidelines, daily intake of sodium <1523m, cholesterol <2080m calories 30% from fat and 7% or less from saturated fats, daily to have 5 or more servings  of fruits and vegetables.  Biometrics:     Pre Biometrics - 07/30/16 1447      Pre Biometrics   Height 5' 8.8" (1.748 m)   Weight 238 lb 3.2 oz (108 kg)   Waist Circumference 45 inches   Hip Circumference 41.5 inches   Waist to Hip Ratio 1.08 %   BMI (Calculated) 35.5   Single Leg Stand 25.28 seconds       Nutrition Therapy Plan and Nutrition Goals:     Nutrition Therapy & Goals - 08/20/16 1537      Nutrition Therapy   Diet No show for dietitian appointment.      Nutrition Discharge: Rate Your Plate Scores:     Nutrition Assessments - 07/30/16 1341      MEDFICTS Scores   Pre Score 85      Nutrition Goals Re-Evaluation:   Nutrition Goals Discharge (Final Nutrition Goals Re-Evaluation):   Psychosocial: Target Goals: Acknowledge presence or absence of significant depression and/or stress, maximize coping skills, provide positive support system. Participant is able to verbalize types and ability to use techniques and skills needed for reducing stress and depression.   Initial Review & Psychosocial Screening:     Initial Psych Review & Screening - 07/30/16 1248      Barriers   Psychosocial barriers to participate in program The patient should benefit from training in stress management and relaxation.     Screening Interventions   Interventions Program counselor consult      Quality of Life Scores:      Quality of Life - 07/30/16 1248      Quality of Life Scores   Health/Function Pre 17 %   Socioeconomic Pre 25.71 %   Psych/Spiritual Pre 24.86 %   Family Pre 25.2 %   GLOBAL Pre 21.62 %      PHQ-9: Recent Review Flowsheet Data    [facility]   Decreased Interest 0   Down, Depressed, Hopeless 0   PHQ - 2 Score 0   Altered sleeping 1   Tired, decreased energy 1   Change in appetite 1   Feeling bad or failure about yourself  0   Trouble concentrating 0   Moving slowly or fidgety/restless 0   Suicidal thoughts 0    PHQ-9 Score 3   Difficult doing work/chores Not difficult at all     Interpretation of Total Score  Total Score Depression  Severity:  1-4 = Minimal depression, 5-9 = Mild depression, 10-14 = Moderate depression, 15-19 = Moderately severe depression, 20-27 = Severe depression   Psychosocial Evaluation and Intervention:     Psychosocial Evaluation - 08/13/16 0926      Psychosocial Evaluation & Interventions   Interventions Encouraged to exercise with the program and follow exercise prescription   Comments Counselor met with Mr. Hunzeker today for initial psychosocial evaluation.  He is a 56 year old who had a heart attack and (4) stents inserted in October, 2017.  He has a spouse of 60 years and a teenaged son who live with him.  Mr. D also has other family support who live close by.  He states he has been sleeping better since beginning this program several weeks ago.  His appetite is good and he denies a history of depression or anxity or any current symptoms.  Mr. D reports minimal stress in his life and states he is typically in a positive mood most of the time.  He has goals to lose weight, increase his energy and his stamina while in this program.  [facility] to play [facility] with his son more often upon discharge from this program.  Staff will continue to follow with Mr. D throughout the course of this program.        Psychosocial Re-Evaluation:   Psychosocial Discharge (Final Psychosocial Re-Evaluation):   Vocational Rehabilitation: Provide vocational rehab assistance to qualifying candidates.   Vocational Rehab Evaluation & Intervention:     Vocational Rehab - 07/30/16 1244      Initial Vocational Rehab Evaluation & Intervention   Assessment shows need for Vocational Rehabilitation No      Education: Education Goals: Education classes will be provided on a weekly basis, covering required topics. Participant will state understanding/return  demonstration of topics presented.  Learning Barriers/Preferences:     Learning Barriers/Preferences - 07/30/16 1243      Learning Barriers/Preferences   Learning Barriers None   Learning Preferences None      Education Topics: General Nutrition Guidelines/Fats and Fiber: -Group instruction provided by verbal, written material, models and posters to present the general guidelines for heart healthy nutrition. Gives an explanation and review of dietary fats and fiber.   Controlling Sodium/Reading Food Labels: -Group verbal and written material supporting the discussion of sodium use in heart healthy nutrition. Review and explanation with models, verbal and written materials for utilization of the food label.   Cardiac Rehab from 08/27/2016 in [facility] Cardiac and Pulmonary Rehab  Date  08/06/16  Educator  CR  Instruction Review Code  2- meets goals/outcomes      Exercise Physiology & Risk Factors: - Group verbal and written instruction with models to review the exercise physiology of the cardiovascular system and associated critical values. Details cardiovascular disease risk factors and the goals associated with each risk factor.   Cardiac Rehab from 08/27/2016 in [facility] Cardiac and Pulmonary Rehab  Date  08/13/16  Educator  [facility]  Instruction Review Code  2- meets goals/outcomes      Aerobic Exercise & Resistance Training: - Gives group verbal and written discussion on the health impact of inactivity. On the components of aerobic and resistive training programs and the benefits of this training and how to safely progress through these programs.   Flexibility, Balance, General Exercise Guidelines: - Provides group verbal and written instruction on the benefits of flexibility and balance training programs. Provides general exercise  guidelines with specific guidelines to those with heart or lung disease. Demonstration and skill practice provided.   Stress Management: - Provides group  verbal and written instruction about the health risks of elevated stress, cause of high stress, and healthy ways to reduce stress.   Depression: - Provides group verbal and written instruction on the correlation between heart/lung disease and depressed mood, treatment options, and the stigmas associated with seeking treatment.   Cardiac Rehab from 08/27/2016 in [facility] Cardiac and Pulmonary Rehab  Date  08/01/16  Educator  [facility]  Instruction Review Code  2- meets goals/outcomes      Anatomy & Physiology of the Heart: - Group verbal and written instruction and models provide basic cardiac anatomy and physiology, with the coronary electrical and arterial systems. Review of: AMI, Angina, Valve disease, Heart Failure, Cardiac Arrhythmia, Pacemakers, and the ICD.   Cardiac Rehab from 08/27/2016 in [facility] Cardiac and Pulmonary Rehab  Date  08/27/16  Educator  MA  Instruction Review Code  2- meets goals/outcomes      Cardiac Procedures: - Group verbal and written instruction and models to describe the testing methods done to diagnose heart disease. Reviews the outcomes of the test results. Describes the treatment choices: Medical Management, Angioplasty, or Coronary Bypass Surgery.   Cardiac Medications: - Group verbal and written instruction to review commonly prescribed medications for heart disease. Reviews the medication, class of the drug, and side effects. Includes the steps to properly store meds and maintain the prescription regimen.   Go Sex-Intimacy & Heart Disease, Get SMART - Goal Setting: - Group verbal and written instruction through game format to discuss heart disease and the return to sexual intimacy. Provides group verbal and written material to discuss and apply goal setting through the application of the S.M.A.R.T. Method.   Other Matters of the Heart: - Provides group verbal, written materials and models to describe Heart Failure, Angina, Valve Disease, and Diabetes in the realm  of heart disease. Includes description of the disease process and treatment options available to the cardiac patient.   Cardiac Rehab from 08/27/2016 in [facility] Cardiac and Pulmonary Rehab  Date  08/27/16  Educator  MA  Instruction Review Code  2- meets goals/outcomes      Exercise & Equipment Safety: - Individual verbal instruction and demonstration of equipment use and safety with use of the equipment.   Cardiac Rehab from 08/27/2016 in [facility] Cardiac and Pulmonary Rehab  Date  07/30/16  Educator  C. EnterkinRN  Instruction Review Code  1- partially meets, needs review/practice      Infection Prevention: - Provides verbal and written material to individual with discussion of infection control including proper hand washing and proper equipment cleaning during exercise session.   Cardiac Rehab from 08/27/2016 in [facility] Cardiac and Pulmonary Rehab  Date  07/30/16  Educator  C. EnterkinRN  Instruction Review Code  2- meets goals/outcomes      Falls Prevention: - Provides verbal and written material to individual with discussion of falls prevention and safety.   Cardiac Rehab from 08/27/2016 in [facility] Cardiac and Pulmonary Rehab  Date  07/30/16  Educator  C. ENterkinRN  Instruction Review Code  2- meets goals/outcomes      Diabetes: - Individual verbal and written instruction to review signs/symptoms of diabetes, desired ranges of glucose level fasting, after meals and with exercise. Advice that pre and post exercise glucose checks will be done for 3 sessions at entry of program.    Knowledge Questionnaire Score:  Knowledge Questionnaire Score - 08/01/16 0842      Knowledge Questionnaire Score   Pre Score 20/28  Reviewed test results and answers with Kyung Rudd today      Core Components/Risk Factors/Patient Goals at Admission:     Personal Goals and Risk Factors at Admission - 07/30/16 1246      Core Components/Risk Factors/Patient Goals on Admission    Weight Management Yes    Intervention Weight Management: Develop a combined nutrition and exercise program designed to reach desired caloric intake, while maintaining appropriate intake of nutrient and fiber, sodium and fats, and appropriate energy expenditure required for the weight goal.;Weight Management: Provide education and appropriate resources to help participant work on and attain dietary goals.;Weight Management/Obesity: Establish reasonable short term and long term weight goals.;Obesity: Provide education and appropriate resources to help participant work on and attain dietary goals.   Admit Weight 238 lb 3.2 oz (108 kg)   Goal Weight: Short Term 233 lb (105.7 kg)   Goal Weight: Long Term 200 lb (90.7 kg)  weight one year ago was 220lbs   Expected Outcomes Short Term: Continue to assess and modify interventions until short term weight is achieved;Weight Loss: Understanding of general recommendations for a balanced deficit meal plan, which promotes 1-2 lb weight loss per week and includes a negative energy balance of [phone] kcal/d;Long Term: Adherence to nutrition and physical activity/exercise program aimed toward attainment of established weight goal;Understanding recommendations for meals to include 15-35% energy as protein, 25-35% energy from fat, 35-60% energy from carbohydrates, less than 263m of dietary cholesterol, 20-35 gm of total fiber daily;Understanding of distribution of calorie intake throughout the day with the consumption of 4-5 meals/snacks   Sedentary Yes   Intervention Provide advice, education, support and counseling about physical activity/exercise needs.;Develop an individualized exercise prescription for aerobic and resistive training based on initial evaluation findings, risk stratification, comorbidities and participant's personal goals.   Expected Outcomes Achievement of increased cardiorespiratory fitness and enhanced flexibility, muscular endurance and strength shown through measurements of  functional capacity and personal statement of participant.   Increase Strength and Stamina Yes   Intervention Provide advice, education, support and counseling about physical activity/exercise needs.;Develop an individualized exercise prescription for aerobic and resistive training based on initial evaluation findings, risk stratification, comorbidities and participant's personal goals.   Expected Outcomes Achievement of increased cardiorespiratory fitness and enhanced flexibility, muscular endurance and strength shown through measurements of functional capacity and personal statement of participant.   Tobacco Cessation Yes   Intervention Assist the participant in steps to quit. Provide individualized education and counseling about committing to Tobacco Cessation, relapse prevention, and pharmacological support that can be provided by physician.;OAdvice worker assist with locating and accessing local/national Quit Smoking programs, and support quit date choice.   Expected Outcomes Short Term: Will demonstrate readiness to quit, by selecting a quit date.;Short Term: Will quit all tobacco product use, adhering to prevention of relapse plan.;Long Term: Complete abstinence from all tobacco products for at least 12 months from quit date.   Hypertension Yes   Intervention Provide education on lifestyle modifcations including regular physical activity/exercise, weight management, moderate sodium restriction and increased consumption of fresh fruit, vegetables, and low fat dairy, alcohol moderation, and smoking cessation.;Monitor prescription use compliance.   Expected Outcomes Short Term: Continued assessment and intervention until BP is < 140/938mHG in hypertensive participants. < 130/8074mG in hypertensive participants with diabetes, heart failure or chronic kidney disease.;Long Term: Maintenance of blood pressure at goal levels.  Lipids Yes   Intervention Provide education and support for  participant on nutrition & aerobic/resistive exercise along with prescribed medications to achieve LDL <21m, HDL >417m   Expected Outcomes Short Term: Participant states understanding of desired cholesterol values and is compliant with medications prescribed. Participant is following exercise prescription and nutrition guidelines.;Long Term: Cholesterol controlled with medications as prescribed, with individualized exercise RX and with personalized nutrition plan. Value goals: LDL < 7028mHDL > 40 mg.      Core Components/Risk Factors/Patient Goals Review:    Core Components/Risk Factors/Patient Goals at Discharge (Final Review):    ITP Comments:     ITP Comments    Row Name 07/30/16 1245 07/30/16 1249 08/03/16 0819 08/22/16 0559 09/10/16 1615   ITP Comments RayKyung Ruddid he has rejoined the [employer] with his son and is playing some basketball with him.  ITP was created during Medical REview today. DX [facility] 05/08/2016 OP visit Appointment made to meet with RD on 08/20/2016 30 day review. Continue with ITP unless directed changes per Medical Director review. Called to check on status of return to program.  LMOBeltramime 09/19/16 0543 09/25/16 1531 10/09/16 1508 10/17/16 0541 10/22/16 1546   ITP Comments 30 day review. Continue with ITP unless directed changes per Medical Director review Last attended session was 08/27/16.  Called to check on status.  LMOM.  Called to check on status of return.  Out since 08/27/16.  LMOM.  30 day review. Continue with ITP unless directed changes per Medical Director review RayDevereauxd not return three phone calls.  He was sent a discharge letter and will be discharged at this time.  His last visit was on 08/27/2016.      Comments: Discharge ITP

## 2016-11-06 ENCOUNTER — Ambulatory Visit: Payer: BC Managed Care – PPO | Attending: Internal Medicine

## 2016-11-06 DIAGNOSIS — G4733 Obstructive sleep apnea (adult) (pediatric): Secondary | ICD-10-CM | POA: Insufficient documentation

## 2016-11-06 DIAGNOSIS — G4710 Hypersomnia, unspecified: Secondary | ICD-10-CM | POA: Diagnosis present

## 2017-11-30 ENCOUNTER — Emergency Department: Payer: BC Managed Care – PPO

## 2017-11-30 ENCOUNTER — Encounter: Payer: Self-pay | Admitting: Emergency Medicine

## 2017-11-30 ENCOUNTER — Emergency Department
Admission: EM | Admit: 2017-11-30 | Discharge: 2017-11-30 | Disposition: A | Discharge status: Home / Self Care | Payer: BC Managed Care – PPO | Attending: Emergency Medicine | Admitting: Emergency Medicine

## 2017-11-30 DIAGNOSIS — M79605 Pain in left leg: Secondary | ICD-10-CM | POA: Diagnosis present

## 2017-11-30 DIAGNOSIS — I10 Essential (primary) hypertension: Secondary | ICD-10-CM | POA: Diagnosis not present

## 2017-11-30 DIAGNOSIS — Z79899 Other long term (current) drug therapy: Secondary | ICD-10-CM | POA: Diagnosis not present

## 2017-11-30 DIAGNOSIS — Z7982 Long term (current) use of aspirin: Secondary | ICD-10-CM | POA: Diagnosis not present

## 2017-11-30 DIAGNOSIS — Z7902 Long term (current) use of antithrombotics/antiplatelets: Secondary | ICD-10-CM | POA: Insufficient documentation

## 2017-11-30 DIAGNOSIS — F17210 Nicotine dependence, cigarettes, uncomplicated: Secondary | ICD-10-CM | POA: Insufficient documentation

## 2017-11-30 HISTORY — DX: Acute embolism and thrombosis of unspecified deep veins of unspecified lower extremity: I82.409

## 2017-11-30 HISTORY — DX: ST elevation (STEMI) myocardial infarction of unspecified site: I21.3

## 2017-11-30 LAB — COMPREHENSIVE METABOLIC PANEL
ALK PHOS: 70 U/L (ref 38–126)
ALT: 22 U/L (ref 17–63)
ANION GAP: 6 (ref 5–15)
AST: 20 U/L (ref 15–41)
Albumin: 3.9 g/dL (ref 3.5–5.0)
BILIRUBIN TOTAL: 0.5 mg/dL (ref 0.3–1.2)
BUN: 12 mg/dL (ref 6–20)
CALCIUM: 9 mg/dL (ref 8.9–10.3)
CO2: 24 mmol/L (ref 22–32)
Chloride: 108 mmol/L (ref 101–111)
Creatinine, Ser: 0.92 mg/dL (ref 0.61–1.24)
GFR calc Af Amer: >60 mL/min (ref >60)
GFR calc non Af Amer: >60 mL/min (ref >60)
Glucose, Bld: 124 mg/dL — ABNORMAL HIGH (ref 65–99)
Potassium: 4.2 mmol/L (ref 3.5–5.1)
Sodium: 138 mmol/L (ref 135–145)
Total Protein: 7.5 g/dL (ref 6.5–8.1)

## 2017-11-30 LAB — CBC
HEMATOCRIT: 45.8 % (ref 40.0–52.0)
Hemoglobin: 15.9 g/dL (ref 13.0–18.0)
MCH: 31.3 pg (ref 26.0–34.0)
MCHC: 34.7 g/dL (ref 32.0–36.0)
MCV: 90.3 fL (ref 80.0–100.0)
Platelets: 352 10*3/uL (ref 150–440)
RBC: 5.07 MIL/uL (ref 4.40–5.90)
RDW: 14 % (ref 11.5–14.5)
WBC: 15.9 10*3/uL — AB (ref 3.8–10.6)

## 2017-11-30 MED ORDER — HYDROCODONE-ACETAMINOPHEN 5-325 MG PO TABS: 1.0000 | ORAL_TABLET | ORAL | 0 refills | Status: DC | PRN

## 2017-11-30 MED ORDER — HYDROCODONE-ACETAMINOPHEN 5-325 MG PO TABS
2.0000 | ORAL_TABLET | Freq: Once | ORAL | Status: AC
  Administered 2017-11-30: 2 via ORAL
  Filled 2017-11-30: qty 2

## 2017-11-30 NOTE — ED Notes (Signed)
Pt reports left hip pain extending to knee since yesterday. Reports hx DVT in right leg and MI in past. Reports unable to walk due to pain. A&Ox4. Pedal pulses strong.

## 2017-11-30 NOTE — ED Notes (Signed)
Patient transported to X-ray 

## 2017-11-30 NOTE — ED Triage Notes (Signed)
Pt denies SOB, cp or other sx's.

## 2017-11-30 NOTE — Discharge Instructions (Addendum)
As we discussed please take your pain medication as needed, but only as prescribed.  Please follow-up with your doctor this week for recheck/reevaluation.  If your pain fails to improve please follow-up with orthopedics by calling the number provided.  If your pain worsens or you develop a fever please return immediately to the emergency department.

## 2017-11-30 NOTE — ED Provider Notes (Signed)
[facility] Emergency Department Provider Note  Time seen: 9:15 AM  I have reviewed the triage vital signs and the nursing notes.   HISTORY  Chief Complaint Leg Pain    HPI Shaun Anderson is a 57 y.o. male with a past medical history of DVT 12 years ago, hypertension, hyperlipidemia, MI last year, who presents to the emergency department for left hip and leg pain.  According to the patient yesterday he developed mild pain in his left hip and left thigh which has progressively worsened yesterday and into today.  Now states moderate pain worse with attempted ambulation.  States very minimal discomfort while lying in bed.  No chest pain or shortness of breath, no leg swelling.  No fever.  Largely negative review of systems otherwise.  Dull aching type pain.  Past Medical History:  Diagnosis Date  . DVT (deep venous thrombosis) ([place])   . Hyperlipidemia   . Hypertension   . STEMI (ST elevation myocardial infarction) [facility])     Patient Active Problem List   Diagnosis Date Noted  . Angina decubitus ([place]) 04/26/2016    Past Surgical History:  Procedure Laterality Date  . CARDIAC CATHETERIZATION Left 04/26/2016   Procedure: Left Heart Cath and Coronary Angiography;  Surgeon: Corey Skains, MD;  Location: [place] CV LAB;  Service: Cardiovascular;  Laterality: Left;  . NO PAST SURGERIES      Prior to Admission medications   Medication Sig Start Date End Date Taking? Authorizing Provider  aspirin EC 81 MG tablet Take 81 mg by mouth daily.    [provider]  atorvastatin (LIPITOR) 80 MG tablet Take 1 tablet (80 mg total) by mouth daily. 04/26/16   Corey Skains, MD  clopidogrel (PLAVIX) 75 MG tablet Take 1 tablet (75 mg total) by mouth daily. 04/26/16   Corey Skains, MD  isosorbide mononitrate (IMDUR) 30 MG 24 hr tablet Take 30 mg by mouth daily.    [provider]  metoprolol succinate (TOPROL XL) 25 MG 24 hr tablet Take 1  tablet (25 mg total) by mouth daily. 04/26/16   Corey Skains, MD    Allergies  Allergen Reactions  . Lisinopril Rash    No family history on file.  Social History Social History   Tobacco Use  . Smoking status: Current Every Day Smoker    Packs/day: 1.00    Types: Cigarettes  . Smokeless tobacco: Never Used  Substance Use Topics  . Alcohol use: Yes    Alcohol/week: 6.0 oz    Types: 10 Cans of beer per week  . Drug use: Not on file    Review of Systems Constitutional: Negative for fever. Eyes: Negative for visual complaints ENT: Negative for recent illness/congestion Cardiovascular: Negative for chest pain. Respiratory: Negative for shortness of breath. Gastrointestinal: Negative for abdominal pain, vomiting  Genitourinary: Negative for urinary compaints Musculoskeletal: Left hip pain and leg pain.  Worse with ambulation. Skin: Negative for skin complaints  Neurological: Negative for headache All other ROS negative  ____________________________________________   PHYSICAL EXAM:  VITAL SIGNS: ED Triage Vitals  Enc Vitals Group     BP 11/30/17 0859 (!) 154/87     Pulse Rate 11/30/17 0859 80     Resp 11/30/17 0859 20     Temp 11/30/17 0859 98.5 F (36.9 C)     Temp Source 11/30/17 0859 Oral     SpO2 11/30/17 0859 96 %     Weight 11/30/17 0900 234  lb (106.1 kg)     Height 11/30/17 0900 5\' 7"  (1.702 m)     Head Circumference --      Peak Flow --      Pain Score 11/30/17 0904 8     Pain Loc --      Pain Edu? --      Excl. in [place]? --    Constitutional: Alert and oriented. Well appearing and in no distress. Eyes: Normal exam ENT   Head: Normocephalic and atraumatic.   Mouth/Throat: Mucous membranes are moist. Cardiovascular: Normal rate, regular rhythm. No murmur Respiratory: Normal respiratory effort without tachypnea nor retractions. Breath sounds are clear  Gastrointestinal: Soft and nontender. No distention.   Musculoskeletal: Moderate left hip  tenderness to palpation.  Good range of motion of the left hip with very minimal pain elicited.  No lower extremity edema, no calf tenderness.  Patient does have varicose veins, but soft and compressible.  No erythema or signs of cellulitis. Neurologic:  Normal speech and language. No gross focal neurologic deficits  Skin:  Skin is warm, dry and intact.  Psychiatric: Mood and affect are normal.   ____________________________________________   RADIOLOGY  Patient's x-rays negative for acute abnormality. Ultrasound negative for DVT.  ____________________________________________   INITIAL IMPRESSION / ASSESSMENT AND PLAN / ED COURSE  Pertinent labs & imaging results that were available during my care of the patient were reviewed by me and considered in my medical decision making (see chart for details).  Patient presents to the emergency department for left leg pain worsening since yesterday.  Overall the patient appears well, no distress.  Has moderate left hip tenderness good range of motion.  Has a history of DVT in the past.  Will obtain an ultrasound to help rule out DVT although less likely clinically.  Will obtain x-ray imaging of the left hip and obtain basic lab work.  We will dose pain medication and continue to closely monitor.  Mild leukocytosis of 15,000.  No fever, good range of motion in the hip with only minimal pain elicited with extreme range of motion.  Do not suspect septic joint.  X-ray and ultrasound are negative.  Patient's lab work is largely within normal limits, besides mild leukocytosis.  Patient states some relief after taking pain medication.  Suspect likely musculoskeletal pain although exact cause of the discomfort is not known at this time.  We will have the patient follow-up with his doctor I will place an orthopedic referral if the patient continues to have discomfort and we will discharge with a short course of pain medication.  Patient agreeable to this plan  of care. ____________________________________________   FINAL CLINICAL IMPRESSION(S) / ED DIAGNOSES  Left leg pain    Harvest Dark, MD 11/30/17 1105

## 2017-11-30 NOTE — ED Triage Notes (Signed)
Pt reports yesterday am started with some pain in his left leg that gradually got worse as the day went on. Pt reports hx of DVT' with similar sx's as this one. Pt denies recent injuries or trauma or other things that may have caused the pain.

## 2018-01-16 ENCOUNTER — Ambulatory Visit: Payer: BC Managed Care – PPO | Attending: Internal Medicine

## 2018-01-16 DIAGNOSIS — G4710 Hypersomnia, unspecified: Secondary | ICD-10-CM | POA: Diagnosis present

## 2018-01-16 DIAGNOSIS — G4733 Obstructive sleep apnea (adult) (pediatric): Secondary | ICD-10-CM | POA: Insufficient documentation

## 2018-04-27 IMAGING — CT CT NECK W/ CM
2 of 3 series · 8 of 14 positions shown, 9 images · IV contrast (iopamidol)
Comparison: None.

CLINICAL DATA: Bilateral lower neck swelling over the last 2-3
months.

EXAM:
CT NECK WITH CONTRAST
TECHNIQUE: Multidetector CT imaging of the neck was performed using the
standard protocol following the bolus administration of intravenous
contrast.
CONTRAST:  75mL 6ZZHJ0-AOO IOPAMIDOL (6ZZHJ0-AOO) INJECTION 61%

[Series 2: axial neck · axial · 0.62mm/px · z∈[-252,-102]mm · 4 of 125 slices shown]
[im 25/125  bone]
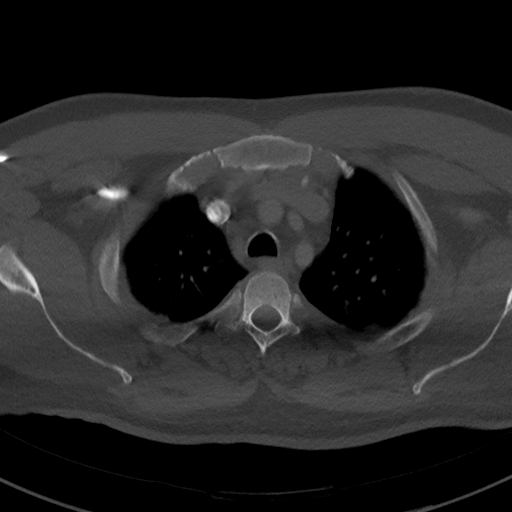
[im 50/125  bone]
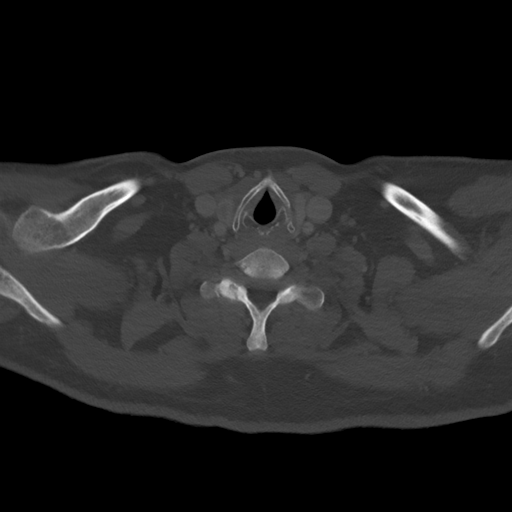
[im 75/125  bone]
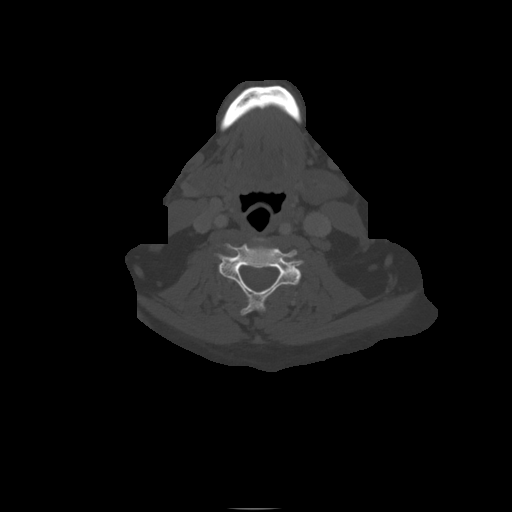
[im 100/125  bone]
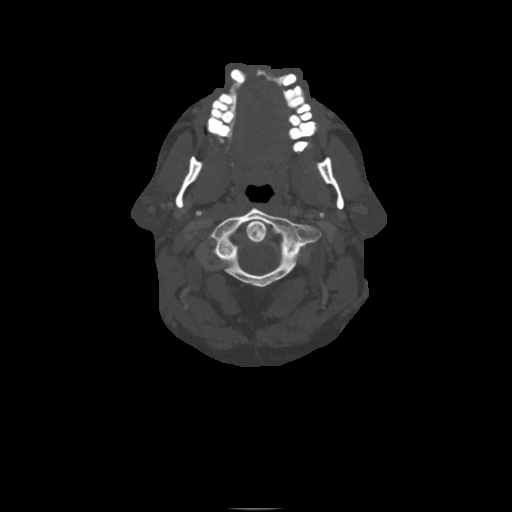

[Series 7: orthogonal ax · axial · 0.51mm/px · z∈[-288,-122]mm · 4 of 143 slices shown, 5 images]
[im 29/143  soft-tissue]
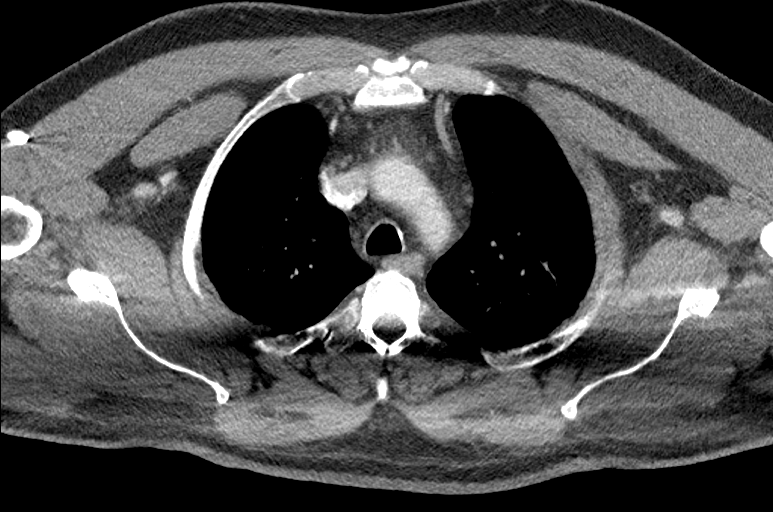
[im 29/143  bone]
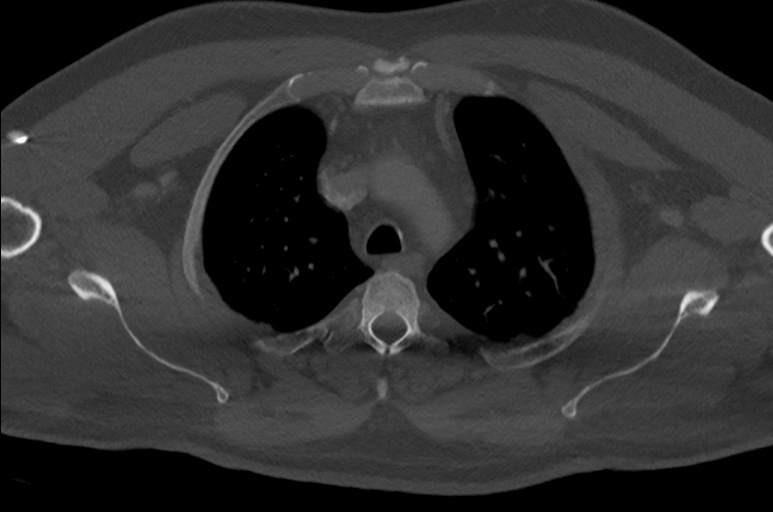
[im 57/143  bone]
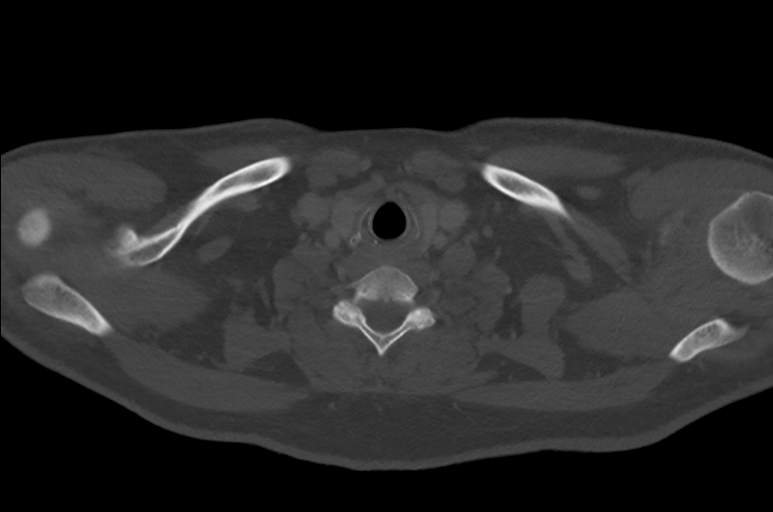
[im 86/143  bone]
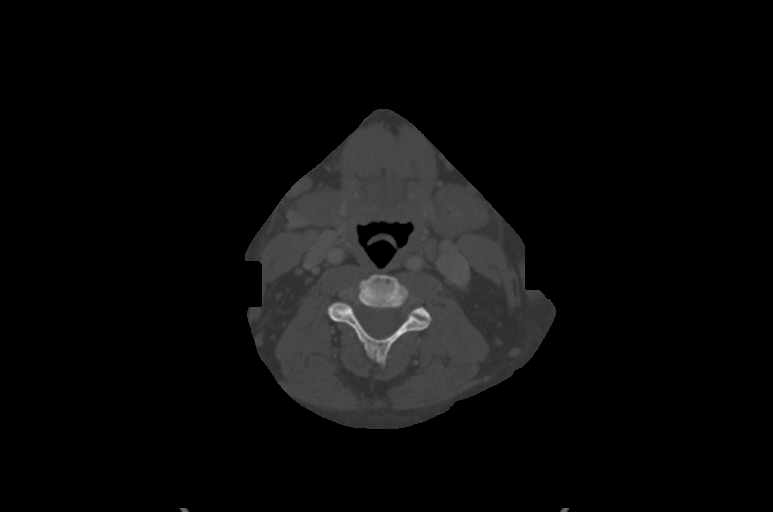
[im 114/143  bone]
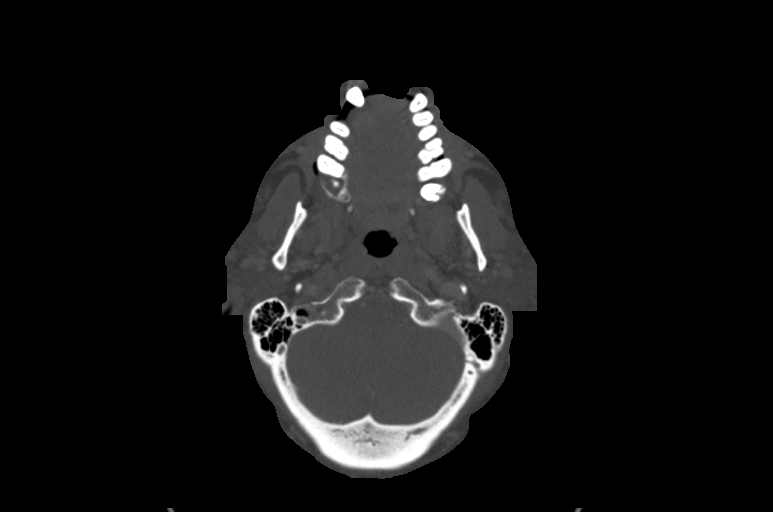

[8 of 14 positions shown; findings below may reference images not displayed]

FINDINGS: Pharynx and larynx: No mucosal or submucosal lesion.

Salivary glands: Parotid and submandibular glands are symmetric and
normal.

Thyroid: Normal

Lymph nodes: No enlarged or low-density nodes on either side of the
neck.

Vascular: Mild atherosclerosis of the carotid bifurcations without
stenosis.

Limited intracranial: Normal

Visualized orbits: Normal

Mastoids and visualized paranasal sinuses: Paranasal mucosal
thickening. No fluid in the middle ears or mastoids.

Skeleton: Ordinary cervical spondylosis.

Upper chest: Normal

Other: Abundant supraclavicular fat. This may explain the presenting
symptoms. No pathologic findings.
IMPRESSION: Negative examination. Abundant supraclavicular fat but without
apparent pathologic finding.

## 2018-06-27 ENCOUNTER — Ambulatory Visit: Admit: 2018-06-27 | Payer: BC Managed Care – PPO | Admitting: Unknown Physician Specialty

## 2018-06-27 SURGERY — COLONOSCOPY WITH PROPOFOL
Anesthesia: General

## 2018-08-22 ENCOUNTER — Ambulatory Visit: Admit: 2018-08-22 | Payer: BC Managed Care – PPO | Admitting: Unknown Physician Specialty

## 2018-08-22 SURGERY — COLONOSCOPY WITH PROPOFOL
Anesthesia: General

## 2018-09-29 ENCOUNTER — Encounter: Payer: Self-pay | Admitting: Student

## 2018-09-29 ENCOUNTER — Ambulatory Visit
Admission: RE | Admit: 2018-09-29 | Discharge: 2018-09-29 | Disposition: A | Discharge status: Home / Self Care | Payer: BC Managed Care – PPO | Attending: Unknown Physician Specialty | Admitting: Unknown Physician Specialty

## 2018-09-29 ENCOUNTER — Other Ambulatory Visit: Payer: Self-pay

## 2018-09-29 ENCOUNTER — Ambulatory Visit: Payer: BC Managed Care – PPO | Admitting: Certified Registered"

## 2018-09-29 ENCOUNTER — Encounter
Admission: RE | Disposition: A | Discharge status: Home / Self Care | Payer: Self-pay | Source: Home / Self Care | Attending: Unknown Physician Specialty

## 2018-09-29 DIAGNOSIS — Z1211 Encounter for screening for malignant neoplasm of colon: Secondary | ICD-10-CM | POA: Diagnosis present

## 2018-09-29 DIAGNOSIS — Z86718 Personal history of other venous thrombosis and embolism: Secondary | ICD-10-CM | POA: Diagnosis not present

## 2018-09-29 DIAGNOSIS — D127 Benign neoplasm of rectosigmoid junction: Secondary | ICD-10-CM | POA: Diagnosis not present

## 2018-09-29 DIAGNOSIS — Z79899 Other long term (current) drug therapy: Secondary | ICD-10-CM | POA: Insufficient documentation

## 2018-09-29 DIAGNOSIS — I252 Old myocardial infarction: Secondary | ICD-10-CM | POA: Insufficient documentation

## 2018-09-29 DIAGNOSIS — G4730 Sleep apnea, unspecified: Secondary | ICD-10-CM | POA: Insufficient documentation

## 2018-09-29 DIAGNOSIS — E785 Hyperlipidemia, unspecified: Secondary | ICD-10-CM | POA: Diagnosis not present

## 2018-09-29 DIAGNOSIS — D124 Benign neoplasm of descending colon: Secondary | ICD-10-CM | POA: Insufficient documentation

## 2018-09-29 DIAGNOSIS — Z7982 Long term (current) use of aspirin: Secondary | ICD-10-CM | POA: Diagnosis not present

## 2018-09-29 DIAGNOSIS — K635 Polyp of colon: Secondary | ICD-10-CM | POA: Insufficient documentation

## 2018-09-29 DIAGNOSIS — I10 Essential (primary) hypertension: Secondary | ICD-10-CM | POA: Diagnosis not present

## 2018-09-29 DIAGNOSIS — F17210 Nicotine dependence, cigarettes, uncomplicated: Secondary | ICD-10-CM | POA: Diagnosis not present

## 2018-09-29 DIAGNOSIS — Z7902 Long term (current) use of antithrombotics/antiplatelets: Secondary | ICD-10-CM | POA: Insufficient documentation

## 2018-09-29 HISTORY — PX: COLONOSCOPY WITH PROPOFOL: SHX5780

## 2018-09-29 HISTORY — DX: Sleep apnea, unspecified: G47.30

## 2018-09-29 SURGERY — COLONOSCOPY WITH PROPOFOL
Anesthesia: General

## 2018-09-29 MED ORDER — PROPOFOL 500 MG/50ML IV EMUL
INTRAVENOUS | Status: AC
  Filled 2018-09-29: qty 50

## 2018-09-29 MED ORDER — MIDAZOLAM HCL 2 MG/2ML IJ SOLN
INTRAMUSCULAR | Status: DC | PRN
  Administered 2018-09-29 (×2): 1 mg via INTRAVENOUS

## 2018-09-29 MED ORDER — ONDANSETRON HCL 4 MG/2ML IJ SOLN
INTRAMUSCULAR | Status: DC | PRN
  Administered 2018-09-29: 4 mg via INTRAVENOUS

## 2018-09-29 MED ORDER — SODIUM CHLORIDE 0.9 % IV SOLN
INTRAVENOUS | Status: DC
  Administered 2018-09-29: 14:00:00 via INTRAVENOUS

## 2018-09-29 MED ORDER — ONDANSETRON HCL 4 MG/2ML IJ SOLN
INTRAMUSCULAR | Status: AC
  Filled 2018-09-29: qty 2

## 2018-09-29 MED ORDER — FENTANYL CITRATE (PF) 100 MCG/2ML IJ SOLN
INTRAMUSCULAR | Status: DC | PRN
  Administered 2018-09-29 (×2): 25 ug via INTRAVENOUS

## 2018-09-29 MED ORDER — FENTANYL CITRATE (PF) 100 MCG/2ML IJ SOLN
INTRAMUSCULAR | Status: AC
  Filled 2018-09-29: qty 2

## 2018-09-29 MED ORDER — PROPOFOL 10 MG/ML IV BOLUS
INTRAVENOUS | Status: DC | PRN
  Administered 2018-09-29: 20 mg via INTRAVENOUS
  Administered 2018-09-29: 30 mg via INTRAVENOUS
  Administered 2018-09-29: 50 mg via INTRAVENOUS

## 2018-09-29 MED ORDER — PROPOFOL 500 MG/50ML IV EMUL
INTRAVENOUS | Status: DC | PRN
  Administered 2018-09-29: 100 ug/kg/min via INTRAVENOUS

## 2018-09-29 MED ORDER — ONDANSETRON HCL 4 MG/2ML IJ SOLN: 4.0000 mg | Freq: Once | INTRAMUSCULAR | Status: DC | PRN

## 2018-09-29 MED ORDER — SODIUM CHLORIDE 0.9 % IV SOLN: INTRAVENOUS | Status: DC

## 2018-09-29 MED ORDER — FENTANYL CITRATE (PF) 100 MCG/2ML IJ SOLN: 25.0000 ug | INTRAMUSCULAR | Status: DC | PRN

## 2018-09-29 MED ORDER — MIDAZOLAM HCL 2 MG/2ML IJ SOLN
INTRAMUSCULAR | Status: AC
  Filled 2018-09-29: qty 2

## 2018-09-29 MED ORDER — LIDOCAINE HCL (PF) 2 % IJ SOLN
INTRAMUSCULAR | Status: AC
  Filled 2018-09-29: qty 10

## 2018-09-29 NOTE — H&P (Signed)
Primary Care Physician:  Tracie Harrier, MD Primary Gastroenterologist:  Dr. Vira Agar  Pre-Procedure History & Physical: HPI:  Shaun Anderson is a 58 y.o. male is here for an colonoscopy.   Past Medical History:  Diagnosis Date  . DVT (deep venous thrombosis) ([place])   . Hyperlipidemia   . Hypertension   . Sleep apnea   . STEMI (ST elevation myocardial infarction) [facility])     Past Surgical History:  Procedure Laterality Date  . CARDIAC CATHETERIZATION Left 04/26/2016   Procedure: Left Heart Cath and Coronary Angiography;  Surgeon: Corey Skains, MD;  Location: [place] CV [facility];  Service: Cardiovascular;  Laterality: Left;  . NO PAST SURGERIES      Prior to Admission medications   Medication Sig Start Date End Date Taking? Authorizing Provider  aspirin EC 81 MG tablet Take 81 mg by mouth daily.   Yes [provider]  atorvastatin (LIPITOR) 80 MG tablet Take 1 tablet (80 mg total) by mouth daily. 04/26/16  Yes Corey Skains, MD  metoprolol succinate (TOPROL XL) 25 MG 24 hr tablet Take 1 tablet (25 mg total) by mouth daily. 04/26/16  Yes Corey Skains, MD  clopidogrel (PLAVIX) 75 MG tablet Take 1 tablet (75 mg total) by mouth daily. Patient not taking: Reported on 11/30/2017 04/26/16   Corey Skains, MD  HYDROcodone-acetaminophen (NORCO/VICODIN) 5-325 MG tablet Take 1 tablet by mouth every 4 (four) hours as needed. Patient not taking: Reported on 09/29/2018 11/30/17   Harvest Dark, MD    Allergies as of 08/19/2018 - Review Complete 11/30/2017  Allergen Reaction Noted  . Lisinopril Rash 04/26/2016    History reviewed. No pertinent family history.  Social History   Socioeconomic History  . Marital status: Married    Spouse name: Not on file  . Number of children: Not on file  . Years of education: Not on file  . Highest education level: Not on file  Occupational History  . Not on file  Social Needs  . Financial resource strain: Not on file   . Food insecurity:    Worry: Not on file    Inability: Not on file  . Transportation needs:    Medical: Not on file    Non-medical: Not on file  Tobacco Use  . Smoking status: Current Every Day Smoker    Packs/day: 1.00    Types: Cigarettes  . Smokeless tobacco: Never Used  Substance and Sexual Activity  . Alcohol use: Yes    Alcohol/week: 10.0 standard drinks    Types: 10 Cans of beer per week  . Drug use: Not on file  . Sexual activity: Not on file  Lifestyle  . Physical activity:    Days per week: Not on file    Minutes per session: Not on file  . Stress: Not on file  Relationships  . Social connections:    Talks on phone: Not on file    Gets together: Not on file    Attends religious service: Not on file    Active member of club or organization: Not on file    Attends meetings of clubs or organizations: Not on file    Relationship status: Not on file  . Intimate partner violence:    Fear of current or ex partner: Not on file    Emotionally abused: Not on file    Physically abused: Not on file    Forced sexual activity: Not on file  Other Topics Concern  .  Not on file  Social History Narrative  . Not on file    Review of Systems: See HPI, otherwise negative ROS  Physical Exam: BP (!) 143/99   Pulse 61   Temp (!) 96.8 F (36 C) (Tympanic)   Resp 18   Ht 5\' 7"  (1.702 m)   Wt 62.6 kg   SpO2 97%   BMI 21.61 kg/m  General:   Alert,  pleasant and cooperative in NAD Head:  Normocephalic and atraumatic. Neck:  Supple; no masses or thyromegaly. Lungs:  Clear throughout to auscultation.    Heart:  Regular rate and rhythm. Abdomen:  Soft, nontender and nondistended. Normal bowel sounds, without guarding, and without rebound.   Neurologic:  Alert and  oriented x4;  grossly normal neurologically.  Impression/Plan: Shaun Anderson is here for an colonoscopy to be performed for screening of colon.  Risks, benefits, limitations, and alternatives regarding   colonoscopy have been reviewed with the patient.  Questions have been answered.  All parties agreeable.   Gaylyn Cheers, MD  09/29/2018, 1:56 PM

## 2018-09-29 NOTE — Anesthesia Post-op Follow-up Note (Signed)
Anesthesia QCDR form completed.        

## 2018-09-29 NOTE — Op Note (Signed)
[facility] Gastroenterology Patient Name: Shaun Anderson Procedure Date: 09/29/2018 1:52 PM MRN: [id number] Account #: [id number] Date of Birth: 1961/05/07 Admit Type: Outpatient Age: 58 Room: [facility] ENDO ROOM 1 Gender: Male Note Status: Finalized Procedure:            Colonoscopy Indications:          Screening for colorectal malignant neoplasm Providers:            Manya Silvas, MD Referring MD:         Tracie Harrier, MD (Referring MD) Medicines:            Propofol per Anesthesia Complications:        No immediate complications. Procedure:            Pre-Anesthesia Assessment:                       - After reviewing the risks and benefits, the patient                        was deemed in satisfactory condition to undergo the                        procedure.                       After obtaining informed consent, the colonoscope was                        passed under direct vision. Throughout the procedure,                        the patient's blood pressure, pulse, and oxygen                        saturations were monitored continuously. The                        Colonoscope was introduced through the anus and                        advanced to the the cecum, identified by appendiceal                        orifice and ileocecal valve. The colonoscopy was                        performed without difficulty. The patient tolerated the                        procedure well. The quality of the bowel preparation                        was poor. Findings:      A small polyp was found in the ascending colon. The polyp was sessile.       The polyp was removed with a cold snare. Resection and retrieval were       complete.      A small polyp was found in the descending colon. The polyp was sessile.       The polyp was removed with a cold snare. Resection and retrieval were  complete. To prevent bleeding after the polypectomy, one hemostatic clip    was successfully placed. There was no bleeding at the end of the       procedure.      A small polyp was found in the recto-sigmoid colon. The polyp was       sessile. The polyp was removed with a cold snare. Resection and       retrieval were complete.      There was a large amount of stool in the right colon making visability       poor. Impression:           - Preparation of the colon was poor on the right side.                       - One small polyp in the ascending colon, removed with                        a hot snare. Resected and retrieved.                       - One small polyp in the descending colon, removed with                        a cold snare. Resected and retrieved. Clip was placed.                       - One small polyp at the recto-sigmoid colon, removed                        with a cold snare. Resected and retrieved. Recommendation:       - Await pathology results. Manya Silvas, MD 09/29/2018 2:35:01 PM This report has been signed electronically. Number of Addenda: 0 Note Initiated On: 09/29/2018 1:52 PM Scope Withdrawal Time: 0 hours 9 minutes 35 seconds  Total Procedure Duration: 0 hours 19 minutes 27 seconds       [facility]

## 2018-09-29 NOTE — Anesthesia Preprocedure Evaluation (Addendum)
Anesthesia Evaluation  Patient identified by MRN, date of birth, ID band Patient awake    Reviewed: Allergy & Precautions, NPO status , Patient's Chart, lab work & pertinent test results  Airway Mallampati: III  TM Distance: <3 FB     Dental  (+) Partial Upper, Missing, Chipped   Pulmonary Current Smoker,    Pulmonary exam normal        Cardiovascular hypertension, Pt. on medications + angina + Past MI and + DVT  Normal cardiovascular exam     Neuro/Psych negative neurological ROS  negative psych ROS   GI/Hepatic negative GI ROS, Neg liver ROS,   Endo/Other  negative endocrine ROS  Renal/GU negative Renal ROS  negative genitourinary   Musculoskeletal negative musculoskeletal ROS (+)   Abdominal Normal abdominal exam  (+)   Peds negative pediatric ROS (+)  Hematology negative hematology ROS (+)   Anesthesia Other Findings   Reproductive/Obstetrics                            Anesthesia Physical Anesthesia Plan  ASA: III  Anesthesia Plan: General   Post-op Pain Management:    Induction: Intravenous  PONV Risk Score and Plan: Propofol infusion  Airway Management Planned: Nasal Cannula  Additional Equipment:   Intra-op Plan:   Post-operative Plan:   Informed Consent: I have reviewed the patients History and Physical, chart, labs and discussed the procedure including the risks, benefits and alternatives for the proposed anesthesia with the patient or authorized representative who has indicated his/her understanding and acceptance.     Dental advisory given  Plan Discussed with: CRNA and Surgeon  Anesthesia Plan Comments:         Anesthesia Quick Evaluation

## 2018-09-29 NOTE — Anesthesia Postprocedure Evaluation (Signed)
Anesthesia Post Note  Patient: Shaun Anderson  Procedure(s) Performed: COLONOSCOPY WITH PROPOFOL (N/A )  Patient location during evaluation: Endoscopy Anesthesia Type: General Level of consciousness: awake and alert and oriented Pain management: pain level controlled Vital Signs Assessment: post-procedure vital signs reviewed and stable Respiratory status: spontaneous breathing Cardiovascular status: blood pressure returned to baseline Anesthetic complications: no     Last Vitals:  Vitals:   09/29/18 1449 09/29/18 1459  BP: 129/80 135/66  Pulse: 60 60  Resp: 20 13  Temp:    SpO2: 98% 100%    Last Pain:  Vitals:   09/29/18 1459  TempSrc:   PainSc: 0-No pain                 Laretta Pyatt

## 2018-09-29 NOTE — Transfer of Care (Signed)
Immediate Anesthesia Transfer of Care Note  Patient: Shaun Anderson  Procedure(s) Performed: COLONOSCOPY WITH PROPOFOL (N/A )  Patient Location: Endoscopy Unit  Anesthesia Type:General  Level of Consciousness: awake and drowsy  Airway & Oxygen Therapy: Patient Spontanous Breathing and Patient connected to nasal cannula oxygen  Post-op Assessment: Report given to RN and Post -op Vital signs reviewed and stable  Post vital signs: stable  Last Vitals:  Vitals Value Taken Time  BP 125/64 09/29/2018  2:29 PM  Temp 35.7 C 09/29/2018  2:29 PM  Pulse 62 09/29/2018  2:32 PM  Resp 20 09/29/2018  2:32 PM  SpO2 100 % 09/29/2018  2:32 PM  Vitals shown include unvalidated device data.  Last Pain:  Vitals:   09/29/18 1429  TempSrc: Tympanic  PainSc: 0-No pain         Complications: No apparent anesthesia complications

## 2018-10-01 ENCOUNTER — Encounter: Payer: Self-pay | Admitting: Unknown Physician Specialty

## 2018-10-01 LAB — SURGICAL PATHOLOGY

## 2019-08-05 ENCOUNTER — Ambulatory Visit
Admission: RE | Admit: 2019-08-05 | Discharge: 2019-08-05 | Disposition: A | Discharge status: Home / Self Care | Payer: BC Managed Care – PPO | Source: Ambulatory Visit | Attending: Internal Medicine | Admitting: Internal Medicine

## 2019-08-05 ENCOUNTER — Other Ambulatory Visit: Payer: Self-pay | Admitting: Internal Medicine

## 2019-08-05 ENCOUNTER — Other Ambulatory Visit: Payer: Self-pay

## 2019-08-05 DIAGNOSIS — M79661 Pain in right lower leg: Secondary | ICD-10-CM | POA: Insufficient documentation

## 2019-08-05 DIAGNOSIS — M7989 Other specified soft tissue disorders: Secondary | ICD-10-CM

## 2019-08-05 DIAGNOSIS — Z86718 Personal history of other venous thrombosis and embolism: Secondary | ICD-10-CM | POA: Diagnosis not present

## 2021-05-31 ENCOUNTER — Other Ambulatory Visit: Payer: Self-pay

## 2021-05-31 DIAGNOSIS — F17210 Nicotine dependence, cigarettes, uncomplicated: Secondary | ICD-10-CM

## 2021-06-27 ENCOUNTER — Ambulatory Visit
Admission: RE | Admit: 2021-06-27 | Discharge: 2021-06-27 | Disposition: A | Discharge status: Home / Self Care | Payer: BC Managed Care – PPO | Source: Ambulatory Visit | Attending: Acute Care | Admitting: Acute Care

## 2021-06-27 ENCOUNTER — Encounter: Payer: Self-pay | Admitting: Acute Care

## 2021-06-27 ENCOUNTER — Telehealth (INDEPENDENT_AMBULATORY_CARE_PROVIDER_SITE_OTHER): Payer: BC Managed Care – PPO | Admitting: Acute Care

## 2021-06-27 ENCOUNTER — Other Ambulatory Visit: Payer: Self-pay

## 2021-06-27 DIAGNOSIS — F17210 Nicotine dependence, cigarettes, uncomplicated: Secondary | ICD-10-CM | POA: Diagnosis present

## 2021-06-27 DIAGNOSIS — I77810 Thoracic aortic ectasia: Secondary | ICD-10-CM | POA: Insufficient documentation

## 2021-06-27 NOTE — Progress Notes (Addendum)
Virtual Visit via Telephone Note  I connected with Shaun Anderson on 06/06/21 at  2:00 PM EST by telephone and verified that I am speaking with the correct person using two identifiers.  Location: Patient: Home Provider: Working from home   I discussed the limitations, risks, security and privacy concerns of performing an evaluation and management service by telephone and the availability of in person appointments. I also discussed with the patient that there may be a patient responsible charge related to this service. The patient expressed understanding and agreed to proceed.  Shared Decision Making Visit Lung Cancer Screening Program [phone])   Eligibility: Age 60 y.o. Pack Years Smoking History Calculation 24 (# packs/per year x # years smoked) Recent History of coughing up blood  no Unexplained weight loss? no ( >Than 15 pounds within the last 6 months ) Prior History Lung / other cancer no (Diagnosis within the last 5 years already requiring surveillance chest CT Scans). Smoking Status Current Smoker Former Smokers: Years since quit: NA  Quit Date: NA  Visit Components: Discussion included one or more decision making aids. yes Discussion included risk/benefits of screening. yes Discussion included potential follow up diagnostic testing for abnormal scans. yes Discussion included meaning and risk of over diagnosis. yes Discussion included meaning and risk of False Positives. yes Discussion included meaning of total radiation exposure. yes  Counseling Included: Importance of adherence to annual lung cancer LDCT screening. yes Impact of comorbidities on ability to participate in the program. yes Ability and willingness to under diagnostic treatment. yes  Smoking Cessation Counseling: Current Smokers:  Discussed importance of smoking cessation. yes Information about tobacco cessation classes and interventions provided to patient. yes Patient provided with "ticket" for  LDCT Scan. yes Symptomatic Patient. yes  Counseling(Intermediate counseling: > three minutes) [address]406 Diagnosis Code: Tobacco Use Z72.0 Asymptomatic Patient NA  Counseling NA Former Smokers:  Discussed the importance of maintaining cigarette abstinence. yes Diagnosis Code: Personal History of Nicotine Dependence. E36.629 Information about tobacco cessation classes and interventions provided to patient. Yes Patient provided with "ticket" for LDCT Scan. yes Written Order for Lung Cancer Screening with LDCT placed in Epic. Yes (CT Chest Lung Cancer Screening Low Dose W/O CM) [id number] Z12.2-Screening of respiratory organs Z87.891-Personal history of nicotine dependence   I spent 25 minutes of face to face time with him discussing the risks and benefits of lung cancer screening. We viewed a power point together that explained in detail the above noted topics. We took the time to pause the power point at intervals to allow for questions to be asked and answered to ensure understanding. We discussed that he had taken the single most powerful action possible to decrease his risk of developing lung cancer when he quit smoking. I counseled him to remain smoke free, and to contact me if he ever had the desire to smoke again so that I can provide resources and tools to help support the effort to remain smoke free. We discussed the time and location of the scan, and that either  [name] or I will call with the results within  24-48 hours of receiving them. He has my card and contact information in the event he needs to speak with me, in addition to a copy of the power point we reviewed as a resource. He verbalized understanding of all of the above and had no further questions upon leaving the office.     I explained to the patient that there has been a  high incidence of coronary artery disease noted on these exams. I explained that this is a non-gated exam therefore degree or severity cannot be  determined. This patient is on statin therapy. I have asked the patient to follow-up with their PCP regarding any incidental finding of coronary artery disease and management with diet or medication as they feel is clinically indicated. The patient verbalized understanding of the above and had no further questions.  Charl Wellen D. [name], NP-C [facility] Personal contact information can be found on Amion  06/27/2021, 8:18 AM

## 2021-06-27 NOTE — Patient Instructions (Signed)
Thank you for participating in the [facility] Screening Program. °It was our pleasure to meet you today. °We will call you with the results of your scan within the next few days. °Your scan will be assigned a Lung RADS category score by the physicians reading the scans.  °This Lung RADS score determines follow up scanning.  °See below for description of categories, and follow up screening recommendations. °We will be in touch to schedule your follow up screening annually or based on recommendations of our providers. °We will fax a copy of your scan results to your Primary Care Physician, or the physician who referred you to the program, to ensure they have the results. °Please call the office if you have any questions or concerns regarding your scanning experience or results.  °Our office number is [phone]. °Please speak with Denise Phelps, RN. She is our Lung Cancer Screening RN. °If she is unavailable when you call, please have the office staff send her a message. She will return your call at her earliest convenience. °Remember, if your scan is normal, we will scan you annually as long as you continue to meet the criteria for the program. (Age 55-77, Current smoker or smoker who has quit within the last 15 years). °If you are a smoker, remember, quitting is the single most powerful action that you can take to decrease your risk of lung cancer and other pulmonary, breathing related problems. °We know quitting is hard, and we are here to help.  °Please let us know if there is anything we can do to help you meet your goal of quitting. °If you are a former smoker, congratulations. We are proud of you! Remain smoke free! °Remember you can refer friends or family members through the number above.  °We will screen them to make sure they meet criteria for the program. °Thank you for helping us take better care of you by participating in Lung Screening. ° °You can receive free nicotine replacement therapy  ( patches, gum or mints) by calling 1-800-QUIT NOW. Please call so we can get you on the path to becoming  a non-smoker. I know it is hard, but you can do this! ° °Lung RADS Categories: ° °Lung RADS 1: no nodules or definitely non-concerning nodules.  °Recommendation is for a repeat annual scan in 12 months. ° °Lung RADS 2:  nodules that are non-concerning in appearance and behavior with a very low likelihood of becoming an active cancer. °Recommendation is for a repeat annual scan in 12 months. ° °Lung RADS 3: nodules that are probably non-concerning , includes nodules with a low likelihood of becoming an active cancer.  Recommendation is for a 6-month repeat screening scan. Often noted after an upper respiratory illness. We will be in touch to make sure you have no questions, and to schedule your 6-month scan. ° °Lung RADS 4 A: nodules with concerning findings, recommendation is most often for a follow up scan in 3 months or additional testing based on our provider's assessment of the scan. We will be in touch to make sure you have no questions and to schedule the recommended 3 month follow up scan. ° °Lung RADS 4 B:  indicates findings that are concerning. We will be in touch with you to schedule additional diagnostic testing based on our provider's  assessment of the scan. ° °Hypnosis for smoking cessation  °[employer] °[phone] ° °Acupuncture for smoking cessation  °[facility] °[phone]  °

## 2021-06-30 ENCOUNTER — Other Ambulatory Visit: Payer: Self-pay | Admitting: Acute Care

## 2021-06-30 DIAGNOSIS — F17200 Nicotine dependence, unspecified, uncomplicated: Secondary | ICD-10-CM

## 2021-06-30 DIAGNOSIS — Z87891 Personal history of nicotine dependence: Secondary | ICD-10-CM

## 2021-06-30 DIAGNOSIS — F17210 Nicotine dependence, cigarettes, uncomplicated: Secondary | ICD-10-CM

## 2021-12-27 ENCOUNTER — Other Ambulatory Visit (INDEPENDENT_AMBULATORY_CARE_PROVIDER_SITE_OTHER): Payer: Self-pay | Admitting: Nurse Practitioner

## 2021-12-27 DIAGNOSIS — I83899 Varicose veins of unspecified lower extremities with other complications: Secondary | ICD-10-CM

## 2021-12-28 ENCOUNTER — Encounter (INDEPENDENT_AMBULATORY_CARE_PROVIDER_SITE_OTHER): Payer: Self-pay | Admitting: Nurse Practitioner

## 2021-12-28 ENCOUNTER — Encounter (INDEPENDENT_AMBULATORY_CARE_PROVIDER_SITE_OTHER): Payer: Self-pay

## 2022-04-16 ENCOUNTER — Other Ambulatory Visit: Payer: Self-pay

## 2022-04-16 ENCOUNTER — Emergency Department: Payer: BC Managed Care – PPO

## 2022-04-16 ENCOUNTER — Encounter: Payer: Self-pay | Admitting: Emergency Medicine

## 2022-04-16 ENCOUNTER — Emergency Department
Admission: EM | Admit: 2022-04-16 | Discharge: 2022-04-16 | Disposition: A | Discharge status: Home / Self Care | Payer: BC Managed Care – PPO | Attending: Emergency Medicine | Admitting: Emergency Medicine

## 2022-04-16 DIAGNOSIS — I10 Essential (primary) hypertension: Secondary | ICD-10-CM | POA: Diagnosis not present

## 2022-04-16 DIAGNOSIS — I8001 Phlebitis and thrombophlebitis of superficial vessels of right lower extremity: Secondary | ICD-10-CM | POA: Insufficient documentation

## 2022-04-16 DIAGNOSIS — M79661 Pain in right lower leg: Secondary | ICD-10-CM | POA: Diagnosis present

## 2022-04-16 LAB — BASIC METABOLIC PANEL
Anion gap: 5 (ref 5–15)
BUN: 11 mg/dL (ref 6–20)
CO2: 27 mmol/L (ref 22–32)
Calcium: 9 mg/dL (ref 8.9–10.3)
Chloride: 110 mmol/L (ref 98–111)
Creatinine, Ser: 0.94 mg/dL (ref 0.61–1.24)
GFR, Estimated: >60 mL/min (ref >60)
Glucose, Bld: 97 mg/dL (ref 70–99)
Potassium: 4 mmol/L (ref 3.5–5.1)
Sodium: 142 mmol/L (ref 135–145)

## 2022-04-16 MED ORDER — APIXABAN 5 MG PO TABS
10.0000 mg | ORAL_TABLET | Freq: Once | ORAL | Status: AC
  Administered 2022-04-16: 10 mg via ORAL
  Filled 2022-04-16: qty 2

## 2022-04-16 MED ORDER — APIXABAN (ELIQUIS) VTE STARTER PACK (10MG AND 5MG): ORAL_TABLET | ORAL | 0 refills | Status: DC

## 2022-04-16 NOTE — Discharge Instructions (Addendum)
You have a clot in one of your varicose veins which is a superficial vein.  However because you have had a deep vein thrombosis before it is recommended that we start you on a blood thinner.  Please stop taking the aspirin while you are on the apixaban.  Primary care provider within the next week or so for reassessment.  If you have any bleeding or injuries please return to the emergency department for evaluation as you are at risk for life-threatening bleeding when you are on a blood thinner.  For your pain please elevate the leg use compression and you can use heat for pain.

## 2022-04-16 NOTE — ED Notes (Signed)
See triage note  Presents with pain and some swelling to right lower leg  States pain started couple of days ago Denies any injury  Increased pain with standing/walking

## 2022-04-16 NOTE — ED Provider Notes (Signed)
[facility] Provider Note    Event Date/Time   First MD Initiated Contact with Patient 04/16/22 1015     (approximate)   History   Leg Swelling   HPI  Shaun Anderson is a 61 y.o. male has medical history of unprovoked DVT in 2006, hyperlipidemia, hypertension, STEMI who presents with right calf pain.  Symptoms started several days ago.  Denies any preceding trauma or provoking activity.  Pain is located in the back of the knee and proximal calf.  Denies numbness tingling weakness.  No fevers or other illnesses or other joint pain.  Denies shortness of breath or chest pain     Past Medical History:  Diagnosis Date   DVT (deep venous thrombosis) (HCC)    Hyperlipidemia    Hypertension    Sleep apnea    STEMI (ST elevation myocardial infarction) [facility])     Patient Active Problem List   Diagnosis Date Noted   Angina decubitus ([place]) 04/26/2016     Physical Exam  Triage Vital Signs: ED Triage Vitals  Enc Vitals Group     BP 04/16/22 0950 133/85     Pulse Rate 04/16/22 0950 74     Resp 04/16/22 0950 20     Temp 04/16/22 0950 97.9 F (36.6 C)     Temp Source 04/16/22 0950 Oral     SpO2 04/16/22 0950 97 %     Weight 04/16/22 0935 236 lb (107 kg)     Height 04/16/22 0935 '5\' 7"'$  (1.702 m)     Head Circumference --      Peak Flow --      Pain Score 04/16/22 0935 2     Pain Loc --      Pain Edu? --      Excl. in [place]? --     Most recent vital signs: Vitals:   04/16/22 0950  BP: 133/85  Pulse: 74  Resp: 20  Temp: 97.9 F (36.6 C)  SpO2: 97%     General: Awake, no distress.  CV:  Good peripheral perfusion.  Resp:  Normal effort.  Abd:  No distention.  Neuro:             Awake, Alert, Oriented x 3  Other:  Patient has significant varicose veins bilaterally, right calf is mildly swollen compared to left, no pitting edema, mildly tender in the popliteal fossa without palpable mass 2+ DP on the right   ED Results / Procedures /  Treatments  Labs (all labs ordered are listed, but only abnormal results are displayed) Labs Reviewed  BASIC METABOLIC PANEL     EKG     RADIOLOGY    PROCEDURES:  Critical Care performed: No  Procedures    MEDICATIONS ORDERED IN ED: Medications  apixaban (ELIQUIS) tablet 10 mg (has no administration in time range)     IMPRESSION / MDM / ASSESSMENT AND PLAN / ED COURSE  I reviewed the triage vital signs and the nursing notes.                              Patient's presentation is most consistent with acute complicated illness / injury requiring diagnostic workup.  Differential diagnosis includes, but is not limited to, DVT, Baker's cyst, muscle strain  Patient is a 61-year-old male presents with atraumatic right calf pain that started several days ago.  He has no other cardiopulmonary symptoms.  On exam the  right calf is mildly swollen but not significantly there is no pitting edema the compartments soft is neurovascular intact.  He is mildly tender in the popliteal fossa there are no palpable masses.  Differential is as above.  Given his history we need to rule out DVT.  He is not currently anticoagulated.  Doppler ultrasound shows superficial thrombophlebitis.  Reviewed guidelines on up-to-date and patient does qualify for needing anticoagulation in the situation because he has had a history of DVT in the past.  Discussed this with the patient he is agreeable to starting Eliquis.  We will check renal function prior to starting.  Recommend ice compression and follow-up with PCP.  Renal function is normal.  Will start on Eliquis.  I recommended that he stop taking his aspirin given we are starting Eliquis.  He has not had any recent MI or stents.  Discussed usual precautions around anticoagulation.  Patient has been on Coumadin on the past with his previous DVT     FINAL CLINICAL IMPRESSION(S) / ED DIAGNOSES   Final diagnoses:  Thrombophlebitis of superficial veins of  right lower extremity     Rx / DC Orders   ED Discharge Orders          Ordered    APIXABAN (ELIQUIS) VTE STARTER PACK ('10MG'$  AND '5MG'$ )        04/16/22 1204             Note:  This document was prepared using Dragon voice recognition software and may include unintentional dictation errors.   Rada Hay, MD 04/16/22 [phone]

## 2022-04-16 NOTE — ED Triage Notes (Signed)
Pt via POV from [facility]. Pt here for possible DVT in the R calf. States that the pain started 3 days ago. Pt has a hx of DVT in 2006. Pt only on a baby ASA at this time. Pt is A&Ox4 and NAD

## 2022-05-21 ENCOUNTER — Encounter (INDEPENDENT_AMBULATORY_CARE_PROVIDER_SITE_OTHER): Payer: Self-pay

## 2022-06-27 ENCOUNTER — Ambulatory Visit: Payer: BC Managed Care – PPO

## 2022-06-28 ENCOUNTER — Ambulatory Visit
Admission: RE | Admit: 2022-06-28 | Discharge: 2022-06-28 | Disposition: A | Discharge status: Home / Self Care | Payer: BC Managed Care – PPO | Source: Ambulatory Visit | Attending: Acute Care | Admitting: Acute Care

## 2022-06-28 DIAGNOSIS — F17200 Nicotine dependence, unspecified, uncomplicated: Secondary | ICD-10-CM | POA: Insufficient documentation

## 2022-06-28 DIAGNOSIS — F17210 Nicotine dependence, cigarettes, uncomplicated: Secondary | ICD-10-CM | POA: Insufficient documentation

## 2022-06-28 DIAGNOSIS — Z87891 Personal history of nicotine dependence: Secondary | ICD-10-CM | POA: Diagnosis present

## 2022-07-02 ENCOUNTER — Other Ambulatory Visit: Payer: Self-pay

## 2022-07-02 DIAGNOSIS — Z87891 Personal history of nicotine dependence: Secondary | ICD-10-CM

## 2022-07-02 DIAGNOSIS — F17210 Nicotine dependence, cigarettes, uncomplicated: Secondary | ICD-10-CM

## 2022-07-02 DIAGNOSIS — Z122 Encounter for screening for malignant neoplasm of respiratory organs: Secondary | ICD-10-CM

## 2022-08-12 ENCOUNTER — Emergency Department: Payer: BC Managed Care – PPO

## 2022-08-12 ENCOUNTER — Inpatient Hospital Stay
Admission: EM | Admit: 2022-08-12 | Discharge: 2022-08-14 | DRG: 062 | Disposition: A | Discharge status: Home / Self Care | Payer: BC Managed Care – PPO | Attending: Student | Admitting: Student

## 2022-08-12 DIAGNOSIS — Z7902 Long term (current) use of antithrombotics/antiplatelets: Secondary | ICD-10-CM

## 2022-08-12 DIAGNOSIS — E785 Hyperlipidemia, unspecified: Secondary | ICD-10-CM | POA: Diagnosis present

## 2022-08-12 DIAGNOSIS — F1410 Cocaine abuse, uncomplicated: Secondary | ICD-10-CM | POA: Diagnosis present

## 2022-08-12 DIAGNOSIS — R29810 Facial weakness: Secondary | ICD-10-CM | POA: Diagnosis present

## 2022-08-12 DIAGNOSIS — I252 Old myocardial infarction: Secondary | ICD-10-CM

## 2022-08-12 DIAGNOSIS — Z1152 Encounter for screening for COVID-19: Secondary | ICD-10-CM | POA: Diagnosis not present

## 2022-08-12 DIAGNOSIS — I2510 Atherosclerotic heart disease of native coronary artery without angina pectoris: Secondary | ICD-10-CM | POA: Diagnosis present

## 2022-08-12 DIAGNOSIS — D751 Secondary polycythemia: Secondary | ICD-10-CM | POA: Diagnosis present

## 2022-08-12 DIAGNOSIS — R4701 Aphasia: Secondary | ICD-10-CM | POA: Diagnosis present

## 2022-08-12 DIAGNOSIS — G4733 Obstructive sleep apnea (adult) (pediatric): Secondary | ICD-10-CM | POA: Diagnosis present

## 2022-08-12 DIAGNOSIS — I6389 Other cerebral infarction: Secondary | ICD-10-CM | POA: Diagnosis not present

## 2022-08-12 DIAGNOSIS — Z7982 Long term (current) use of aspirin: Secondary | ICD-10-CM

## 2022-08-12 DIAGNOSIS — Z888 Allergy status to other drugs, medicaments and biological substances status: Secondary | ICD-10-CM | POA: Diagnosis not present

## 2022-08-12 DIAGNOSIS — Z86718 Personal history of other venous thrombosis and embolism: Secondary | ICD-10-CM | POA: Diagnosis not present

## 2022-08-12 DIAGNOSIS — I63512 Cerebral infarction due to unspecified occlusion or stenosis of left middle cerebral artery: Secondary | ICD-10-CM | POA: Diagnosis present

## 2022-08-12 DIAGNOSIS — I10 Essential (primary) hypertension: Secondary | ICD-10-CM | POA: Diagnosis present

## 2022-08-12 DIAGNOSIS — Z7901 Long term (current) use of anticoagulants: Secondary | ICD-10-CM | POA: Diagnosis not present

## 2022-08-12 DIAGNOSIS — Z955 Presence of coronary angioplasty implant and graft: Secondary | ICD-10-CM | POA: Diagnosis not present

## 2022-08-12 DIAGNOSIS — R29707 NIHSS score 7: Secondary | ICD-10-CM | POA: Diagnosis present

## 2022-08-12 DIAGNOSIS — G8191 Hemiplegia, unspecified affecting right dominant side: Secondary | ICD-10-CM | POA: Diagnosis present

## 2022-08-12 DIAGNOSIS — G4730 Sleep apnea, unspecified: Secondary | ICD-10-CM | POA: Diagnosis present

## 2022-08-12 DIAGNOSIS — I639 Cerebral infarction, unspecified: Secondary | ICD-10-CM | POA: Diagnosis present

## 2022-08-12 DIAGNOSIS — Z79899 Other long term (current) drug therapy: Secondary | ICD-10-CM | POA: Diagnosis not present

## 2022-08-12 DIAGNOSIS — F17210 Nicotine dependence, cigarettes, uncomplicated: Secondary | ICD-10-CM | POA: Diagnosis present

## 2022-08-12 LAB — COMPREHENSIVE METABOLIC PANEL
ALT: 22 U/L (ref 0–44)
AST: 21 U/L (ref 15–41)
Albumin: 3.5 g/dL (ref 3.5–5.0)
Alkaline Phosphatase: 76 U/L (ref 38–126)
Anion gap: 5 (ref 5–15)
BUN: 16 mg/dL (ref 8–23)
CO2: 28 mmol/L (ref 22–32)
Calcium: 8.5 mg/dL — ABNORMAL LOW (ref 8.9–10.3)
Chloride: 103 mmol/L (ref 98–111)
Creatinine, Ser: 1.01 mg/dL (ref 0.61–1.24)
GFR, Estimated: >60 mL/min (ref >60)
Glucose, Bld: 116 mg/dL — ABNORMAL HIGH (ref 70–99)
Potassium: 4.1 mmol/L (ref 3.5–5.1)
Sodium: 136 mmol/L (ref 135–145)
Total Bilirubin: 0.8 mg/dL (ref 0.3–1.2)
Total Protein: 7 g/dL (ref 6.5–8.1)

## 2022-08-12 LAB — DIFFERENTIAL
Abs Immature Granulocytes: 0.03 10*3/uL (ref 0.00–0.07)
Basophils Absolute: 0.1 10*3/uL (ref 0.0–0.1)
Basophils Relative: 1 %
Eosinophils Absolute: 0.4 10*3/uL (ref 0.0–0.5)
Eosinophils Relative: 4 %
Immature Granulocytes: 0 %
Lymphocytes Relative: 21 %
Lymphs Abs: 2 10*3/uL (ref 0.7–4.0)
Monocytes Absolute: 1 10*3/uL (ref 0.1–1.0)
Monocytes Relative: 11 %
Neutro Abs: 6 10*3/uL (ref 1.7–7.7)
Neutrophils Relative %: 63 %

## 2022-08-12 LAB — CBC
HCT: 46.7 % (ref 39.0–52.0)
Hemoglobin: 15.6 g/dL (ref 13.0–17.0)
MCH: 29.8 pg (ref 26.0–34.0)
MCHC: 33.4 g/dL (ref 30.0–36.0)
MCV: 89.3 fL (ref 80.0–100.0)
Platelets: 428 10*3/uL — ABNORMAL HIGH (ref 150–400)
RBC: 5.23 MIL/uL (ref 4.22–5.81)
RDW: 13.4 % (ref 11.5–15.5)
WBC: 9.5 10*3/uL (ref 4.0–10.5)
nRBC: 0 % (ref 0.0–0.2)

## 2022-08-12 LAB — RESP PANEL BY RT-PCR (RSV, FLU A&B, COVID)  RVPGX2
Influenza A by PCR: NEGATIVE
Influenza B by PCR: NEGATIVE
Resp Syncytial Virus by PCR: NEGATIVE
SARS Coronavirus 2 by RT PCR: NEGATIVE

## 2022-08-12 LAB — PROTIME-INR
INR: 1 (ref 0.8–1.2)
Prothrombin Time: 13.4 seconds (ref 11.4–15.2)

## 2022-08-12 LAB — ETHANOL: Alcohol, Ethyl (B): <10 mg/dL (ref <10)

## 2022-08-12 LAB — GLUCOSE, CAPILLARY: Glucose-Capillary: 152 mg/dL — ABNORMAL HIGH (ref 70–99)

## 2022-08-12 LAB — APTT: aPTT: 26 seconds (ref 24–36)

## 2022-08-12 LAB — MRSA NEXT GEN BY PCR, NASAL: MRSA by PCR Next Gen: NOT DETECTED

## 2022-08-12 MED ORDER — IOHEXOL 350 MG/ML SOLN
100.0000 mL | Freq: Once | INTRAVENOUS | Status: AC | PRN
  Administered 2022-08-12: 100 mL via INTRAVENOUS

## 2022-08-12 MED ORDER — STROKE: EARLY STAGES OF RECOVERY BOOK
Freq: Once | Status: DC
  Filled 2022-08-12: qty 1

## 2022-08-12 MED ORDER — ORAL CARE MOUTH RINSE: 15.0000 mL | OROMUCOSAL | Status: DC | PRN

## 2022-08-12 MED ORDER — TENECTEPLASE FOR STROKE
25.0000 mg | PACK | Freq: Once | INTRAVENOUS | Status: AC
  Administered 2022-08-12: 25 mg via INTRAVENOUS
  Filled 2022-08-12: qty 10

## 2022-08-12 MED ORDER — ACETAMINOPHEN 325 MG PO TABS: 650.0000 mg | ORAL_TABLET | ORAL | Status: DC | PRN

## 2022-08-12 MED ORDER — ACETAMINOPHEN 650 MG RE SUPP: 650.0000 mg | RECTAL | Status: DC | PRN

## 2022-08-12 MED ORDER — LABETALOL HCL 5 MG/ML IV SOLN: 10.0000 mg | INTRAVENOUS | Status: DC | PRN

## 2022-08-12 MED ORDER — ACETAMINOPHEN 160 MG/5ML PO SOLN: 650.0000 mg | ORAL | Status: DC | PRN

## 2022-08-12 NOTE — Consult Note (Addendum)
Neurology Consultation Reason for Consult: Code stroke Requesting Physician: Merlyn Lot  CC: Aphasia and right sided weakness  History is obtained from: EMS, Chart review and wife via phone  HPI: JONH MCQUEARY is a 62 y.o. male with a PMHx significant for HTN, HLD, OSA, CAD c/b STEMI s/p stents, prior DVT not currently on [facility], polycythemia, ongoing smoking  He was in his usual state of health today, had finished eating lunch with his wife at 1 or 1:30 PM after which she was napping on the couch.  He was heard to fall off the couch around 3:30 PM for which EMS was activated and he was brought to [facility] ED as a code stroke due to aphasia and right-sided weakness.  His symptoms were somewhat fluctuating  He was last treated with Eliquis for superficial thrombophlebitis of the left leg in September but has not been taking this medication recently.  He did have a more remote history of DVT  Per wife, URI last week, improving symptoms this week  LKW: 13:00 PM Thrombolytic given?: Yes 17:26 PM  Checklist of contraindications was reviewed and negative. Risks, benefits and alternatives were discussed  CTA initially obtained prior to TNK as I had not yet been able to reach family to review TNK checklist and did not want to delay potential thrombectomy IA performed?: No, rapidly improving symptoms Premorbid modified rankin scale:      0 - No symptoms.   ROS: Unable to obtain due to aphasia, negative per wife  Past Medical History:  Diagnosis Date   DVT (deep venous thrombosis) ([place])    Hyperlipidemia    Hypertension    Sleep apnea    STEMI (ST elevation myocardial infarction) [facility])    Past Surgical History:  Procedure Laterality Date   CARDIAC CATHETERIZATION Left 04/26/2016   Procedure: Left Heart Cath and Coronary Angiography;  Surgeon: Corey Skains, MD;  Location: [place] CV LAB;  Service: Cardiovascular;  Laterality: Left;   COLONOSCOPY WITH PROPOFOL N/A 09/29/2018    Procedure: COLONOSCOPY WITH PROPOFOL;  Surgeon: Manya Silvas, MD;  Location: [facility] ENDOSCOPY;  Service: Endoscopy;  Laterality: N/A;   NO PAST SURGERIES      Outpatient meds - Losartan - Atorvastatin - One other BP med - No blood thinners    No family history on file.   Social History:  reports that he has been smoking cigarettes. He has a 24.00 pack-year smoking history. He has never used smokeless tobacco. He reports current alcohol use of about 10.0 standard drinks of alcohol per week. No history on file for drug use.   Exam: Current vital signs: There were no vitals taken for this visit. Vital signs in last 24 hours:     Physical Exam  Constitutional: Appears well-developed and well-nourished.  Psych: Affect appropriate to situation, pleasant, bemused Eyes: No scleral injection HENT: No oropharyngeal obstruction.  MSK: no joint deformities.  Cardiovascular: Perfusing extremities well Respiratory: Effort normal, non-labored breathing GI: Soft.  No distension. There is no tenderness.  Skin: Warm dry and intact visible skin  Neuro: Mental Status: Patient is awake, alert, but only intermittently able to follow commands.  Not able to give any significant history.  Productive greater than receptive aphasia initially but did have some elements of receptive aphasia as well (for example unable to follow command to make a fist initially, which improved post TNK).  Unable to correctly state age/month Cranial Nerves: II: Visual Fields were difficult to assess due to aphasia,  was orienting to stimuli in all visual fields but seem to have slight difficulty in the right inferior quadrant. Pupils are equal, round, and reactive to light.  III,IV, VI: EOMI without ptosis or diploplia. No gaze preference V: Facial sensation is symmetric to temperature VII: Facial movement is symmetric on initial evaluation but then had a slight right facial droop VIII: hearing is intact to voice X:  Uvula elevates symmetrically XI: Shoulder shrug is symmetric. XII: tongue is midline without atrophy or fasciculations.  Motor: Cannot perform confrontational strength testing due to aphasia.  Initially did not have any drift of the bilateral upper extremities but the right lower extremity drifted rapidly to the bed.  Per EMS he was actually able to ambulate using both legs equally but had right upper extremity weakness.  Post TNK no drift in any of the extremities Sensory: Grossly equally reactive to touch in all 4 extremities Cerebellar: Unable to test due to the aphasia initially, and subsequently intact Gait:  Deferred   NIHSS total 7, improving to a 4 post TNK  Initially 2 for questions, 1 for commands, 2 for RLE weakness, 2 for aphasia,   Post TNK 2 for questions, 1 for right facial droop, 1 for language Performed at  time of patient arrival to ED    I have reviewed labs in epic and the results pertinent to this consultation are:  Basic Metabolic Panel: Recent Labs  Lab 08/12/22 1644  NA 136  K 4.1  CL 103  CO2 28  GLUCOSE 116*  BUN 16  CREATININE 1.01  CALCIUM 8.5*    CBC: Recent Labs  Lab 08/12/22 1644  WBC 9.5  NEUTROABS 6.0  HGB 15.6  HCT 46.7  MCV 89.3  PLT 428*    Coagulation Studies: Recent Labs    08/12/22 1644  LABPROT 13.4  INR 1.0     No results found for: "CHOL", "HDL", "LDLCALC", "LDLDIRECT", "TRIG", "CHOLHDL"  No results found for: "HGBA1C"   I have reviewed the images obtained:  Head CT personally reviewed, no acute intracranial process  CTA personally reviewed, distal M2/M3 severe stenosis versus occlusion  CT perfusion was initially obtained as patient was very close to 6-hour window per EMS, but on clarification with wife was within a 4.5-hour window.  Given RAPID AI software is validated for greater than 6-hour window, suspect core seen is inaccurate.  Estimated tissue at risk matching well to the area of severe stenosis versus  occlusion on CTA  Impression: Distal left M2/M3 stroke, atheroembolic versus cardioembolic pending further workup  Recommendations: # Left M2/M3 MCA stroke - Stroke labs HgbA1c, fasting lipid panel - MRI brain 24 hours post TNK - Frequent neuro checks - Echocardiogram - Hold for 24 hours until post TNK head imaging completed  - Risk factor modification - Telemetry monitoring - Blood pressure goal   - Post TNK for 24  hours < 180/105 - PT consult, OT consult, Speech consult, unless patient is back to baseline - Neurology team to follow, appreciate admission by critical care team at [place] [phone] [facility] coverage for [facility] is from 8 AM to 4 AM in-house and 4 PM to 8 PM by telephone/video. 8 PM to 8 AM emergent questions or overnight urgent questions should be addressed to Teleneurology On-call or [facility] neurohospitalist; contact information can be found on AMION  CRITICAL CARE Performed by: Lorenza Chick   Total critical care time: 60 minutes  Critical  care time was exclusive of separately billable procedures and treating other patients.  Critical care was necessary to treat or prevent imminent or life-threatening deterioration.  Critical care was time spent personally by me on the following activities: development of treatment plan with patient and/or surrogate as well as nursing, discussions with consultants, evaluation of patient's response to treatment, examination of patient, obtaining history from patient or surrogate, ordering and performing treatments and interventions, ordering and review of laboratory studies, ordering and review of radiographic studies, pulse oximetry and re-evaluation of patient's condition.

## 2022-08-12 NOTE — H&P (Signed)
NAME:  Shaun Anderson, MRN:  [id number], DOB:  1961/07/12, LOS: 0 ADMISSION DATE:  08/12/2022  CHIEF COMPLAINT:  slurred speech  BRIEF SYNOPSIS Admitted for CODE STROKE ACUTE CVA  History of Present Illness:  62 y.o. male  with a PMHx significant for HTN, HLD, OSA on CPAP, CAD c/b STEMI s/p stents, prior DVT not currently on AC, polycythemia, ongoing smoking who presents to the ER for evaluation of slurred speech.   Per ED and EMS report, patient had finished eating lunch with his wife around 1 or 1:30 pm when he was last seen normal. He took a nap on the couch and per reports he fell off the couch due to inability to get up around 3:30 pm. EMS was called and Code stroke was called in route.    ED Course: Initial vital signs showed Blood pressure 123/88, pulse 73, temperature 98.1 F (36.7 C),, resp. rate (!) 22, weight 105.9 kg, SpO2 100 %. Patient was immediately evaluated by Neurologist at the bedside. STAT CTH obtained and showed No evidence of acute intracranial abnormality. ASPECTS is 10. Patient was deemed candidate for thromblytics therapy with initial bolus administered at 17:26 pm. PCCM consulted for admission to ICU for close monitoring post TNK.  Significant Hospital Events: Including procedures, antibiotic start and stop dates in addition to other pertinent events   1/21 admitted for acute CVA, CODE STROKE s/p TNK   Objective   Blood pressure 123/88, pulse 73, temperature 98.1 F (36.7 C), temperature source Oral, resp. rate (!) 22, weight 105.9 kg, SpO2 100 %.       No intake or output data in the 24 hours ending 08/12/22 1813 Filed Weights   08/12/22 1715  Weight: 105.9 kg   Review of Systems: Gen:  Denies  fever, sweats, chills weight loss  HEENT: Denies blurred vision, double vision, ear pain, eye pain, hearing loss, nose bleeds, sore throat Cardiac:  No dizziness, chest pain or heaviness, chest tightness,edema, No JVD Resp:   No cough, -sputum production,  -shortness of breath,-wheezing, -hemoptysis,  Gi: Denies swallowing difficulty, stomach pain, nausea or vomiting, diarrhea, constipation, bowel incontinence Gu:  Denies bladder incontinence, burning urine Ext:   Denies Joint pain, stiffness or swelling Skin: Denies  skin rash, easy bruising or bleeding or hives Endoc:  Denies polyuria, polydipsia , polyphagia or weight change Psych:   Denies depression, insomnia or hallucinations  Other:  All other systems negative   Physical Examination:   General Appearance: No distress  EYES PERRLA, EOM intact.   NECK Supple, No JVD Pulmonary: CTA B/L normal breath sounds, No wheezing.  CardiovascularNormal S1,S2.  No m/r/g.  Regular Rate and Rhythm Abdomen: Benign, Soft, non-tender. Skin:   warm, no rashes, no ecchymosis  Extremities: normal, no cyanosis, clubbing. Neuro:Mild aphasia, slight drift in RUE, Strength 4/5, otherwise no other focal neurological deficit. Cranial nerves intact. Sensation intact. Gait deffered PSYCHIATRIC: Mood, affect within normal limits. SKIN:normal, warm to touch, pale, warm Capillary refill delayed  Pulses present bilaterally  ALL OTHER ROS ARE NEGATIVE Labs/imaging that I havepersonally reviewed  (right click and "Reselect all SmartList Selections" daily)   Labs   CBC: Recent Labs  Lab 08/12/22 1644  WBC 9.5  NEUTROABS 6.0  HGB 15.6  HCT 46.7  MCV 89.3  PLT 428*    Basic Metabolic Panel: Recent Labs  Lab 08/12/22 1644  NA 136  K 4.1  CL 103  CO2 28  GLUCOSE 116*  BUN 16  CREATININE 1.01  CALCIUM 8.5*   GFR: Estimated Creatinine Clearance: 89.1 mL/min (by C-G formula based on SCr of 1.01 mg/dL). Recent Labs  Lab 08/12/22 1644  WBC 9.5    Liver Function Tests: Recent Labs  Lab 08/12/22 1644  AST 21  ALT 22  ALKPHOS 76  BILITOT 0.8  PROT 7.0  ALBUMIN 3.5   No results for input(s): "LIPASE", "AMYLASE" in the last 168 hours. No results for input(s): "AMMONIA" in the last 168  hours.  ABG No results found for: "PHART", "PCO2ART", "PO2ART", "HCO3", "TCO2", "ACIDBASEDEF", "O2SAT"   Coagulation Profile: Recent Labs  Lab 08/12/22 1644  INR 1.0    Cardiac Enzymes: No results for input(s): "CKTOTAL", "CKMB", "CKMBINDEX", "TROPONINI" in the last 168 hours.  HbA1C: No results found for: "HGBA1C"  CBG: No results for input(s): "GLUCAP" in the last 168 hours.   Past Medical History:  He,  has a past medical history of DVT (deep venous thrombosis) ([place]), Hyperlipidemia, Hypertension, Sleep apnea, and STEMI (ST elevation myocardial infarction) ([place]).   Surgical History:   Past Surgical History:  Procedure Laterality Date   CARDIAC CATHETERIZATION Left 04/26/2016   Procedure: Left Heart Cath and Coronary Angiography;  Surgeon: Corey Skains, MD;  Location: [facility];  Service: Cardiovascular;  Laterality: Left;   COLONOSCOPY WITH PROPOFOL N/A 09/29/2018   Procedure: COLONOSCOPY WITH PROPOFOL;  Surgeon: Manya Silvas, MD;  Location: [facility] END[facility];  Service: Endoscopy;  Laterality: N/A;   NO PAST SURGERIES       Social History:   reports that he has been smoking cigarettes. He has a 24.00 pack-year smoking history. He has never used smokeless tobacco. He reports current alcohol use of about 10.0 standard drinks of alcohol per week.   Family History:  His family history is not on file.   Allergies Allergies  Allergen Reactions   Lisinopril Rash     Home Medications  Prior to Admission medications   Medication Sig Start Date End Date Taking? Authorizing Provider  APIXABAN Arne [place]) VTE STARTER PACK ('10MG'$  AND '5MG'$ ) Take as directed on package: start with two-'5mg'$  tablets twice daily for 7 days. On day 8, switch to one-'5mg'$  tablet twice daily. 04/16/22   Rada Hay, MD  aspirin EC 81 MG tablet Take 81 mg by mouth daily.    [provider]  atorvastatin (LIPITOR) 80 MG tablet Take 1 tablet (80 mg total) by mouth daily. 04/26/16    Corey Skains, MD  clopidogrel (PLAVIX) 75 MG tablet Take 1 tablet (75 mg total) by mouth daily. Patient not taking: Reported on 11/30/2017 04/26/16   Corey Skains, MD  HYDROcodone-acetaminophen (NORCO/VICODIN) 5-325 MG tablet Take 1 tablet by mouth every 4 (four) hours as needed. Patient not taking: Reported on 09/29/2018 11/30/17   Harvest Dark, MD  metoprolol succinate (TOPROL XL) 25 MG 24 hr tablet Take 1 tablet (25 mg total) by mouth daily. 04/26/16   Corey Skains, MD   Scheduled Meds:  [START ON 08/13/2022]  stroke: early stages of recovery book   Does not apply Once   Continuous Infusions: PRN Meds:.acetaminophen **OR** acetaminophen (TYLENOL) oral liquid 160 mg/5 mL **OR** acetaminophen, labetalol  Active Hospital Problem list     Assessment & Plan:  # Left M2/M3 MCA stroke S/p Post IV TPA for Ischemic Stroke  - Vital signs/blood pressure with neuro checks/NIHSS  per tPA protocol for the first 24 hours  - No lines, catheters, antiplatelet or anticoagulants x24 hr after IV tPA  unless required  - keep BP <185/100 with short-acting antihypertensives (Labetolol, Hydralazine or Nicardipine Gtt as needed).  - Obtain MRI/MRA brain without contrast per stroke protocol - Obtain TTE to r/o cardioembolic source, assess EF  - check fasting Lipid Panel with Direct LDL in AM.  - check HgA1c, TSH  - Will repeat head CT in 24 hours s/p IV Alteplase per protocol. - Place SCDs for DVT prevention. - NPO for now until passes swallow screen  - PT consult, OT consult, Speech consult - Aspiration precautions, Bleeding precautions - Obtain STAT head CT for any new acute headache or new neurological deficits - Smoking cessation  #CAD Hx of STEMI + PCI s/p Stent  - Hold ASA '81mg'$  PO daily - Hold Clopidogrel '75mg'$  PO daily  - HTN, HLD, control as below  #HLD  + Goal LDL<100 - Atorvastatin '80mg'$  PO qhs  #HTN  + Goal BP as above - Continue home Beta-blocker (Metoprolol) once able  to tolerate po as BP parameters permits  #Left Lower Extremity DVT -Hold Eliquis for now  #OSA on CPAP -CPAP at bedtime   Best practice:  Diet:  NPO Pain/Anxiety/Delirium protocol (if indicated): No VAP protocol (if indicated): Not indicated DVT prophylaxis: Contraindicated GI prophylaxis: N/A Glucose control:  SSI No Central venous access:  N/A Arterial line:  N/A Foley:  N/A Mobility:  bed rest  PT consulted: Yes Last date of multidisciplinary goals of care discussion [1/21] Code Status:  full code Disposition: ICU   = Goals of Care = Code Status Order: FULL  Primary Emergency Contact: Cress,Gwen, Home Phone: [phone] Wishes to pursue full aggressive treatment and intervention options, including CPR and intubation, but goals of care will be addressed on going with patient and  family if that should become necessary.   Critical care time: 49 minutes       Rufina Falco DNP, CCRN, FNP-C, AGACNP-BC Acute Care NP [facility] PCCM on call pager [phone]

## 2022-08-12 NOTE — Progress Notes (Signed)
eLink Physician-Brief Progress Note Patient Name: Shaun Anderson DOB: 10-Jun-1961 MRN: [id number]   Date of Service  08/12/2022  HPI/Events of Note  61/M with HTN, CAD, who presents with AMS. Pt was heard to fall off the couch and was found to have aphasia and R sided weakness. STAT CT head did not show acute process.  He was assessed with L MCA stroke and given TNK. He was then admitted to the ICU for close monitoring  eICU Interventions  - Admit to ICU - Neurochecks per protocol. Plan for STAT CT head if with acute clinical deterioration, otherwise, plan for repeat imaging 24h post TNK . - Maintain SBP <182mHg - Hold antiplatelets/anticoagulation 24h post TNK - Plan for echo, A1c, lipid panel - Will need to be advised on smoking cessation.          [place]/21/2024, 8:38 PM

## 2022-08-12 NOTE — ED Notes (Signed)
Transported to CT scan

## 2022-08-12 NOTE — ED Provider Notes (Signed)
[facility] Provider Note    Event Date/Time   First MD Initiated Contact with Patient 08/12/22 1643     (approximate)   History   No chief complaint on file.   HPI  Shaun Anderson is a 62 y.o. male history of hypertension hyperlipidemia, OSA, CAD previous history of DVT not currently on anticoagulation presents to the ER for evaluation of slurred speech and inability getting out of couch around 1 today.  Code stroke was called in route via EMS.  Patient evaluated immediately by neurology upon arrival.  He is protecting his airway.  No report of any trauma.     Physical Exam   Triage Vital Signs: ED Triage Vitals  Enc Vitals Group     BP      Pulse      Resp      Temp      Temp src      SpO2      Weight      Height      Head Circumference      Peak Flow      Pain Score      Pain Loc      Pain Edu?      Excl. in [place]?     Most recent vital signs: Vitals:   08/12/22 1758 08/12/22 1800  BP:  123/88  Pulse:  73  Resp:  (!) 22  Temp: 98.1 F (36.7 C)   SpO2:  100%     Constitutional: Alert  Eyes: Conjunctivae are normal.  Head: Atraumatic. Nose: No congestion/rhinnorhea. Mouth/Throat: Mucous membranes are moist.   Neck: Painless ROM.  Cardiovascular:   Good peripheral circulation. Respiratory: Normal respiratory effort.  No retractions.  Gastrointestinal: Soft and nontender.  Musculoskeletal:  no deformity Neurologic:  MAE spontaneously.  Sensation intact.  Speech now significantly improved. Skin:  Skin is warm, dry and intact. No rash noted. Psychiatric: Mood and affect are normal. Speech and behavior are normal.    ED Results / Procedures / Treatments   Labs (all labs ordered are listed, but only abnormal results are displayed) Labs Reviewed  CBC - Abnormal; Notable for the following components:      Result Value   Platelets 428 (*)    All other components within normal limits  COMPREHENSIVE METABOLIC PANEL -  Abnormal; Notable for the following components:   Glucose, Bld 116 (*)    Calcium 8.5 (*)    All other components within normal limits  ETHANOL  PROTIME-INR  APTT  DIFFERENTIAL  URINE DRUG SCREEN, QUALITATIVE ([facility] ONLY)  URINALYSIS, ROUTINE W REFLEX MICROSCOPIC     EKG  ED ECG REPORT I, Merlyn Lot, the attending physician, personally viewed and interpreted this ECG.   Date: 08/12/2022  EKG Time: 17:37  Rate: 76  Rhythm: sinus  Axis: normal  Intervals: iRBBB  ST&T Change: no stemi, no depressions    RADIOLOGY Please see ED Course for my review and interpretation.  I personally reviewed all radiographic images ordered to evaluate for the above acute complaints and reviewed radiology reports and findings.  These findings were personally discussed with the patient.  Please see medical record for radiology report.    PROCEDURES:  Critical Care performed: Yes, see critical care procedure note(s)  .Critical Care  Performed by: Merlyn Lot, MD Authorized by: Merlyn Lot, MD   Critical care provider statement:    Critical care time (minutes):  15   Critical care was time  spent personally by me on the following activities:  Ordering and performing treatments and interventions, ordering and review of laboratory studies, ordering and review of radiographic studies, pulse oximetry, re-evaluation of patient's condition, review of old charts, obtaining history from patient or surrogate, examination of patient, evaluation of patient's response to treatment, discussions with primary provider, discussions with consultants and development of treatment plan with patient or surrogate    MEDICATIONS ORDERED IN ED: Medications  labetalol (NORMODYNE) injection 10 mg (has no administration in time range)  iohexol (OMNIPAQUE) 350 MG/ML injection 100 mL (100 mLs Intravenous Contrast Given 08/12/22 1733)  tenecteplase (TNKASE) injection for Stroke 25 mg (25 mg Intravenous  Given 08/12/22 1726)     IMPRESSION / MDM / [place] / ED COURSE  I reviewed the triage vital signs and the nursing notes.                              Differential diagnosis includes, but is not limited to, cva, tia, hypoglycemia, dehydration, electrolyte abnormality, dissection, sepsis   Patient presenting to the ER for evaluation of symptoms as described above.  Based on symptoms, risk factors and considered above differential, this presenting complaint could reflect a potentially life-threatening illness therefore the patient will be placed on continuous pulse oximetry and telemetry for monitoring.  Laboratory evaluation will be sent to evaluate for the above complaints.  Code stroke initiated prior to arrival.  Patient evaluated immediately by neurology and taken urgently to CT imaging.  Protecting his airway.    Clinical Course as of 08/12/22 1806  [facility] did recommend TNK and consented patient and family member.  Reassessed after receiving TNK.  Says he is feeling improved. [PR]  1801 Patient remains stable.  Will be admitted to our facility.  Will consult ICU for admission. [PR]    Clinical Course User Index [PR] Merlyn Lot, MD      FINAL CLINICAL IMPRESSION(S) / ED DIAGNOSES   Final diagnoses:  Cerebrovascular accident (CVA), unspecified mechanism ([place])     Rx / DC Orders   ED Discharge Orders     None        Note:  This document was prepared using Dragon voice recognition software and may include unintentional dictation errors.    Merlyn Lot, MD 08/12/22 1806

## 2022-08-12 NOTE — Progress Notes (Signed)
CODE STROKE- PHARMACY COMMUNICATION   Time CODE STROKE called/page received: [id number]  Time response to CODE STROKE was made (in person or via phone): 1645  Time Stroke Kit retrieved from [place] (only if needed): 1650  Name of Provider/Nurse contacted: Dr. Curly Shores   Past Medical History:  Diagnosis Date   DVT (deep venous thrombosis) ([place])    Hyperlipidemia    Hypertension    Sleep apnea    STEMI (ST elevation myocardial infarction) ([place])    Prior to Admission medications   Medication Sig Start Date End Date Taking? Authorizing Provider  APIXABAN (ELIQUIS) VTE STARTER PACK ('10MG'$  AND '5MG'$ ) Take as directed on package: start with two-'5mg'$  tablets twice daily for 7 days. On day 8, switch to one-'5mg'$  tablet twice daily. 04/16/22   Rada Hay, MD  aspirin EC 81 MG tablet Take 81 mg by mouth daily.    [provider]  atorvastatin (LIPITOR) 80 MG tablet Take 1 tablet (80 mg total) by mouth daily. 04/26/16   Corey Skains, MD  clopidogrel (PLAVIX) 75 MG tablet Take 1 tablet (75 mg total) by mouth daily. Patient not taking: Reported on 11/30/2017 04/26/16   Corey Skains, MD  HYDROcodone-acetaminophen (NORCO/VICODIN) 5-325 MG tablet Take 1 tablet by mouth every 4 (four) hours as needed. Patient not taking: Reported on 09/29/2018 11/30/17   Harvest Dark, MD  metoprolol succinate (TOPROL XL) 25 MG 24 hr tablet Take 1 tablet (25 mg total) by mouth daily. 04/26/16   Corey Skains, MD     Glean Salvo, PharmD, BCPS Clinical Pharmacist  08/12/2022 5:34 PM

## 2022-08-12 NOTE — Progress Notes (Signed)
  Chaplain On-Call responded to Code Stroke notification at 1634 hours.  The patient was moved from the ED-1 Hallway to the CT Scan area.  Chaplain assured Staff of availability if additional support is needed.

## 2022-08-12 NOTE — ED Notes (Signed)
Neurology at bedside. Code stroke called by EMS.

## 2022-08-12 NOTE — ED Triage Notes (Signed)
Pt arrives via EMS from home. EMS initially reported that last known well was 1145, Neurologist confirmed with wife that the LKW was 1300 today. Around 33, wife found the patient confused, couldn't walk, and had severe difficulty speaking. Pt is Alert but has aphasia.

## 2022-08-13 ENCOUNTER — Inpatient Hospital Stay: Payer: BC Managed Care – PPO

## 2022-08-13 ENCOUNTER — Inpatient Hospital Stay (HOSPITAL_COMMUNITY)
Admit: 2022-08-13 | Discharge: 2022-08-13 | Disposition: A | Discharge status: Home / Self Care | Payer: BC Managed Care – PPO | Attending: Internal Medicine | Admitting: Internal Medicine

## 2022-08-13 DIAGNOSIS — I6389 Other cerebral infarction: Secondary | ICD-10-CM

## 2022-08-13 DIAGNOSIS — I639 Cerebral infarction, unspecified: Secondary | ICD-10-CM | POA: Diagnosis not present

## 2022-08-13 LAB — ECHOCARDIOGRAM COMPLETE
AR max vel: 3.88 cm2
AV Area VTI: 4.11 cm2
AV Area mean vel: 3.93 cm2
AV Mean grad: 3 mmHg
AV Peak grad: 5.7 mmHg
Ao pk vel: 1.19 m/s
Area-P 1/2: 4.96 cm2
MV VTI: 3.31 cm2
S' Lateral: 4.4 cm
Weight: 3735.47 oz

## 2022-08-13 LAB — CBC
HCT: 48.3 % (ref 39.0–52.0)
Hemoglobin: 15.6 g/dL (ref 13.0–17.0)
MCH: 29.3 pg (ref 26.0–34.0)
MCHC: 32.3 g/dL (ref 30.0–36.0)
MCV: 90.6 fL (ref 80.0–100.0)
Platelets: 417 10*3/uL — ABNORMAL HIGH (ref 150–400)
RBC: 5.33 MIL/uL (ref 4.22–5.81)
RDW: 13.5 % (ref 11.5–15.5)
WBC: 9 10*3/uL (ref 4.0–10.5)
nRBC: 0 % (ref 0.0–0.2)

## 2022-08-13 LAB — URINE DRUG SCREEN, QUALITATIVE (ARMC ONLY)
Amphetamines, Ur Screen: NOT DETECTED
Barbiturates, Ur Screen: NOT DETECTED
Benzodiazepine, Ur Scrn: NOT DETECTED
Cannabinoid 50 Ng, Ur ~~LOC~~: NOT DETECTED
Cocaine Metabolite,Ur ~~LOC~~: POSITIVE — AB
MDMA (Ecstasy)Ur Screen: NOT DETECTED
Methadone Scn, Ur: NOT DETECTED
Opiate, Ur Screen: NOT DETECTED
Phencyclidine (PCP) Ur S: NOT DETECTED
Tricyclic, Ur Screen: NOT DETECTED

## 2022-08-13 LAB — LIPID PANEL
Cholesterol: 141 mg/dL (ref 0–200)
HDL: 53 mg/dL (ref >40)
LDL Cholesterol: 73 mg/dL (ref 0–99)
Total CHOL/HDL Ratio: 2.7 RATIO
Triglycerides: 76 mg/dL (ref <150)
VLDL: 15 mg/dL (ref 0–40)

## 2022-08-13 LAB — HEMOGLOBIN A1C
Hgb A1c MFr Bld: 5.9 % — ABNORMAL HIGH (ref 4.8–5.6)
Mean Plasma Glucose: 122.63 mg/dL

## 2022-08-13 LAB — BASIC METABOLIC PANEL
Anion gap: 6 (ref 5–15)
BUN: 12 mg/dL (ref 8–23)
CO2: 26 mmol/L (ref 22–32)
Calcium: 8.5 mg/dL — ABNORMAL LOW (ref 8.9–10.3)
Chloride: 104 mmol/L (ref 98–111)
Creatinine, Ser: 0.89 mg/dL (ref 0.61–1.24)
GFR, Estimated: >60 mL/min (ref >60)
Glucose, Bld: 92 mg/dL (ref 70–99)
Potassium: 3.9 mmol/L (ref 3.5–5.1)
Sodium: 136 mmol/L (ref 135–145)

## 2022-08-13 LAB — URINALYSIS, ROUTINE W REFLEX MICROSCOPIC
Bilirubin Urine: NEGATIVE
Glucose, UA: NEGATIVE mg/dL
Hgb urine dipstick: NEGATIVE
Ketones, ur: NEGATIVE mg/dL
Leukocytes,Ua: NEGATIVE
Nitrite: NEGATIVE
Protein, ur: NEGATIVE mg/dL
Specific Gravity, Urine: 1.014 (ref 1.005–1.030)
pH: 7 (ref 5.0–8.0)

## 2022-08-13 LAB — HIV ANTIBODY (ROUTINE TESTING W REFLEX): HIV Screen 4th Generation wRfx: NONREACTIVE

## 2022-08-13 LAB — MAGNESIUM: Magnesium: 2.4 mg/dL (ref 1.7–2.4)

## 2022-08-13 LAB — PHOSPHORUS: Phosphorus: 3.1 mg/dL (ref 2.5–4.6)

## 2022-08-13 LAB — TSH: TSH: 1.144 u[IU]/mL (ref 0.350–4.500)

## 2022-08-13 MED ORDER — CHLORHEXIDINE GLUCONATE CLOTH 2 % EX PADS
6.0000 | MEDICATED_PAD | Freq: Every day | CUTANEOUS | Status: DC
  Administered 2022-08-13: 6 via TOPICAL

## 2022-08-13 NOTE — Progress Notes (Signed)
OT Cancellation Note  Patient Details Name: Shaun Anderson MRN: [id number] DOB: 12/29/1960   Cancelled Treatment:    Reason Eval/Treat Not Completed: Medical issues which prohibited therapy. OT order received and chart reviewed. Pt received TNK on 1/21 at 1726. Per therapy guidelines, pt will be held for 24 hours from the time TNK was initiated and repeat imaging performed. OT to re-attempt evaluation on 1/23.  Darleen Crocker, [place], OTR/L , CBIS ascom [phone]  08/13/22, 12:50 PM

## 2022-08-13 NOTE — Progress Notes (Signed)
[facility] Progress Note  Patient: Shaun Anderson    HEN:[id number]  DOA: 08/12/2022     Date of Service: the patient was seen and examined on 08/13/2022  Chief Complaint  Patient presents with   Code Stroke   Brief hospital course: 62 y.o. male  with a PMHx significant for HTN, HLD, OSA on CPAP, CAD c/b STEMI s/p stents, prior DVT LLE currently on AC, polycythemia, ongoing smoking who presents to the ER for evaluation of slurred speech.    Per ED and EMS report, patient had finished eating lunch with his wife around 1 or 1:30 pm when he was last seen normal. He took a nap on the couch and per reports he fell off the couch due to inability to get up around 3:30 pm. EMS was called and Code stroke was called in route.     ED Course: Initial vital signs showed Blood pressure 123/88, pulse 73, temperature 98.1 F (36.7 C),, resp. rate (!) 22, weight 105.9 kg, SpO2 100 %. Patient was immediately evaluated by Neurologist at the bedside. STAT CTH obtained and showed No evidence of acute intracranial abnormality. ASPECTS is 10. Patient was deemed candidate for thromblytics therapy with initial bolus administered at 17:26 pm. PCCM consulted for admission to ICU for close monitoring post TNK.  1/21 admitted for acute CVA, CODE STROKE s/p TNK    Assessment and Plan: # Left M2/M3 MCA stroke S/p Post IV TPA for Ischemic Stroke  -Vital signs/blood pressure with neuro checks/NIHSS  per tPA protocol for the first 24 hours  No lines, catheters, antiplatelet or anticoagulants x24 hr after IV tPA unless required  keep BP <185/100 with short-acting antihypertensives (Labetolol, Hydralazine or Nicardipine Gtt as needed).  Obtain MRI/MRA brain without contrast per stroke protocol Obtain TTE to r/o cardioembolic source, assess EF  fasting Lipid Panel with Direct LDL 73, HbA1c 5.9, TSH 1.1 Place SCDs for DVT prevention. PT consult, OT consult, Speech consult - Aspiration precautions, Bleeding  precautions - Obtain STAT head CT for any new acute headache or new neurological deficits - Smoking cessation counseling done    # Cocaine use disorder: UDS positive for cocaine Drug abuse abstinence counseling done.   #CAD Hx of STEMI + PCI s/p Stent  - Hold ASA '81mg'$  PO daily - Hold Clopidogrel '75mg'$  PO daily  - HTN, HLD, control as below   #HLD   Goal LDL<70, LDL level 73 at lower end - Atorvastatin '80mg'$  PO qhs   #HTN  + Goal BP as above - Continue home Beta-blocker (Metoprolol) once able to tolerate po as BP parameters permits   #Left Lower Extremity DVT Venous duplex: 1. No evidence of acute or worsening DVT within the right lower extremity. 2. 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 3. No evidence of acute or chronic DVT within the left lower extremity. -Hold Eliquis for now, until cleared by neurology   #OSA on CPAP -CPAP at bedtime   Body mass index is 36.57 kg/m.  Interventions:       Diet: Heart healthy DVT Prophylaxis: SCD, pharmacological prophylaxis contraindicated due to TNK risk of bleeding.    Advance goals of care discussion: Full code  Family Communication: family was present at bedside, at the time of interview.  The pt provided permission to discuss medical plan with the family. Opportunity was given to ask question and all questions were answered satisfactorily.   Disposition:  Pt  is from Home, admitted with Acute CVA, still workup is pending, which precludes a safe discharge. Discharge to Home, when cleared by neurology..  Subjective: No significant events overnight, patient feels improvement, almost 95% better.  Denies any headache or dizziness, no difficulty swallowing, no problem with the speech.  Denies any focal weakness at numbness.  Physical Exam: General: NAD, lying comfortably Appear in no distress, affect appropriate Eyes:  PERRLA ENT: Oral Mucosa Clear, moist  Neck: no JVD,  Cardiovascular: S1 and S2 Present, no Murmur,  Respiratory: good respiratory effort, Bilateral Air entry equal and Decreased, no Crackles, no wheezes Abdomen: Bowel Sound present, Soft and no tenderness,  Skin: no rashes Extremities: no Pedal edema, no calf tenderness Neurologic: without any new focal findings Gait not checked due to patient safety concerns  Vitals:   08/13/22 1200 08/13/22 1226 08/13/22 1300 08/13/22 1326  BP: 112/67 112/67 (!) 118/90 (!) 118/90  Pulse: (!) 58  (!) 58   Resp: 18  18   Temp:      TempSrc:      SpO2: 94%  96%   Weight:        Intake/Output Summary (Last 24 hours) at 08/13/2022 1358 Last data filed at 08/13/2022 0900 Gross per 24 hour  Intake --  Output 1200 ml  Net -1200 ml   Filed Weights   08/12/22 1715  Weight: 105.9 kg    Data Reviewed: I have personally reviewed and interpreted daily labs, tele strips, imagings as discussed above. I reviewed all nursing notes, pharmacy notes, vitals, pertinent old records I have discussed plan of care as described above with RN and patient/family.  CBC: Recent Labs  Lab 08/12/22 1644 08/13/22 0443  WBC 9.5 9.0  NEUTROABS 6.0  --   HGB 15.6 15.6  HCT 46.7 48.3  MCV 89.3 90.6  PLT 428* 962*   Basic Metabolic Panel: Recent Labs  Lab 08/12/22 1644 08/13/22 0443  NA 136 136  K 4.1 3.9  CL 103 104  CO2 28 26  GLUCOSE 116* 92  BUN 16 12  CREATININE 1.01 0.89  CALCIUM 8.5* 8.5*  MG  --  2.4  PHOS  --  3.1    Studies: US Venous Img Lower Bilateral (DVT)  Result Date: 08/13/2022 CLINICAL DATA:  History of previous DVT, on anticoagulation. Evaluate for acute or chronic DVT. EXAM: BILATERAL LOWER EXTREMITY VENOUS DOPPLER ULTRASOUND TECHNIQUE: Gray-scale sonography with graded compression, as well as color Doppler and duplex ultrasound were performed to evaluate the lower extremity deep venous systems from the level of the common femoral  vein and including the common femoral, femoral, profunda femoral, popliteal and calf veins including the posterior tibial, peroneal and gastrocnemius veins when visible. The superficial great saphenous vein was also interrogated. Spectral Doppler was utilized to evaluate flow at rest and with distal augmentation maneuvers in the common femoral, femoral and popliteal veins. 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 FINDINGS: RIGHT LOWER EXTREMITY Common Femoral Vein: No evidence of thrombus. Normal compressibility, respiratory phasicity and response to augmentation. Saphenofemoral Junction: No evidence of thrombus. Normal compressibility and flow on color Doppler imaging. Profunda Femoral Vein: No evidence of thrombus. Normal compressibility and flow on color Doppler imaging. Femoral Vein: Once again, the mid aspect the right femoral vein  appears partially duplicated. There is grossly unchanged nonocclusive wall thickening/chronic DVT involving 1 of the paired divisions of the mid aspect the right femoral vein (image 48), as well as the distal (image 50) aspects of the femoral vein, unchanged compared to the 03/2022 examination. Popliteal Vein: Redemonstrated nonocclusive wall thickening/chronic DVT involving the right popliteal vein (image 52 through 57), similar to the 03/2022 examination. Calf Veins: No evidence of thrombus. Normal compressibility and flow on color Doppler imaging. Superficial Great Saphenous Vein: No evidence of thrombus. Normal compressibility. Other Findings:  None. LEFT LOWER EXTREMITY Common Femoral Vein: No evidence of thrombus. Normal compressibility, respiratory phasicity and response to augmentation.  Saphenofemoral Junction: No evidence of thrombus. Normal compressibility and flow on color Doppler imaging. Profunda Femoral Vein: No evidence of thrombus. Normal compressibility and flow on color Doppler imaging. Femoral Vein: No evidence of thrombus. Normal compressibility, respiratory phasicity and response to augmentation. Popliteal Vein: No evidence of thrombus. Normal compressibility, respiratory phasicity and response to augmentation. Calf Veins: No evidence of thrombus. Normal compressibility and flow on color Doppler imaging. Superficial Great Saphenous Vein: No evidence of thrombus. Normal compressibility. Other Findings:  None. IMPRESSION: 1. No evidence of acute or worsening DVT within the right lower extremity. 2. 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 3. No evidence of acute or chronic DVT within the left lower extremity. Electronically Signed   By: Sandi Mariscal M.D.   On: 08/13/2022 12:30   CT ANGIO HEAD NECK W WO CM W PERF (CODE STROKE)  Result Date: 08/12/2022 CLINICAL DATA:  Neuro deficit, acute, stroke suspected EXAM: CT ANGIOGRAPHY HEAD AND NECK CT PERFUSION BRAIN TECHNIQUE: Multidetector CT imaging of the head and neck was performed using the standard protocol during bolus administration of intravenous contrast. Multiplanar CT image reconstructions and MIPs were obtained to evaluate the vascular anatomy. Carotid stenosis measurements (when applicable) are obtained utilizing NASCET criteria, using the distal internal carotid diameter as the denominator. Multiphase CT imaging of the brain was performed following IV bolus contrast injection. Subsequent parametric perfusion maps were calculated using RAPID software. RADIATION DOSE REDUCTION: This exam was performed according to the departmental dose-optimization program which includes automated exposure control, adjustment of the mA and/or kV  according to patient size and/or use of iterative reconstruction technique. CONTRAST:  122m OMNIPAQUE IOHEXOL 350 MG/ML SOLN COMPARISON:  Same day CT head. FINDINGS: CTA NECK FINDINGS Aortic arch: Great vessel origins are patent. Right carotid system: Atherosclerosis at the carotid bifurcation without greater than 50% stenosis. Left carotid system: Atherosclerosis at the carotid bifurcation without greater than 50% stenosis. Retropharyngeal course. Vertebral arteries: Moderate right vertebral artery origin stenosis. The right vertebral artery is dominant. The left vertebral artery is small/hypoplastic throughout its course. Skeleton: No acute findings. Other neck: No acute findings. Subcentimeter right thyroid nodule which does not require further imaging follow-up (ref: J Am Coll Radiol. 2015 Feb;12(2): 143-50). Upper chest: Clear lung apices. Review of the MIP images confirms the above findings CTA HEAD FINDINGS Anterior circulation: The intracranial ICAs and proximal MCAs and ACAs are patent. Severely stenotic versus occluded left distal M2/proximal M3 MCA (for example see series 6, images 84, 85, and 86). Posterior circulation: Bilateral intradural vertebral arteries and basilar artery are patent. The left intradural vertebral artery is non dominant/small. Prominent bilateral posterior communicating arteries with small P1 PCAs, anatomic variant. The posterior cerebral arteries are patent without proximal hemodynamically significant stenosis. Venous sinuses: As permitted by  contrast timing, patent. Review of the MIP images confirms the above findings CT Brain Perfusion Findings: ASPECTS: 10. CBF (<30%) Volume: 72m Perfusion (Tmax>6.0s) volume: 250mMismatch Volume: 55m45mnfarction Location:Posterior left MCA territory. IMPRESSION: 1. Severely stenotic versus occluded left distal M2/proximal M3 MCA. 2. In the region of the vessel described above there is an approximately 19 mL area of core infarct with 3 mL of  surrounding penumbra. On discussion with Dr. BhaCurly Shorese core may not be validated given the clinical timeframe. 3. Moderate right vertebral artery origin stenosis. Findings discussed with Dr. RobQuentin Cornwall 5:33 p.m. Findings also discussed with Dr. BhaCurly Shoresa telephone at 5:34 PM. Electronically Signed   By: FreMargaretha SheffieldD.   On: 08/12/2022 17:43   CT HEAD CODE STROKE WO CONTRAST  Result Date: 08/12/2022 CLINICAL DATA:  Code stroke.  Neuro deficit, acute, stroke suspected EXAM: CT HEAD WITHOUT CONTRAST TECHNIQUE: Contiguous axial images were obtained from the base of the skull through the vertex without intravenous contrast. RADIATION DOSE REDUCTION: This exam was performed according to the departmental dose-optimization program which includes automated exposure control, adjustment of the mA and/or kV according to patient size and/or use of iterative reconstruction technique. COMPARISON:  None Available. FINDINGS: Brain: No evidence of acute large vascular territory infarction, hemorrhage, hydrocephalus, extra-axial collection or mass lesion/mass effect. Vascular: No hyperdense vessel identified. Calcific atherosclerosis. Skull: No acute fracture. Sinuses/Orbits: Paranasal sinus mucosal thickening. Debris within the nasal cavity, partially imaged. Other: No mastoid effusions. Left scalp radiology okay thank you and kidney also assigned a CTA for me of to [place] in [place] to thank you ASPECTS ([facility] Program Early CT Score) total score (0-10 with 10 being normal): 10 IMPRESSION: 1. No evidence of acute intracranial abnormality. 2. ASPECTS is 10. Electronically Signed   By: FreMargaretha SheffieldD.   On: 08/12/2022 17:33    Scheduled Meds:   stroke: early stages of recovery book   Does not apply Once   Chlorhexidine Gluconate Cloth  6 each Topical Daily   Continuous Infusions: PRN Meds: acetaminophen **OR** acetaminophen (TYLENOL) oral liquid 160 mg/5 mL **OR** acetaminophen,  labetalol, mouth rinse  Time spent: 50 minutes  Author: DILVal RilesD [facility] 08/13/2022 1:58 PM  To reach On-call, see care teams to locate the attending and reach out to them via [email or web address] 7PM-7AM, please contact night-coverage If you still have difficulty reaching the attending provider, please page the [facility] on Call) for [facility] on amion for assistance.

## 2022-08-13 NOTE — Progress Notes (Signed)
Subjective: Feels much improved  Exam: Vitals:   08/13/22 0626 08/13/22 0734  BP: 133/76 (!) 141/90  Pulse:  (!) 58  Resp:    Temp:  97.8 F (36.6 C)  SpO2:  95%   Gen: In bed, NAD Resp: non-labored breathing, no acute distress Abd: soft, nt  Neuro: MS: Awake, alert, still some trouble with repetition but able to name simple objects. CN: EO MI, visual fields full Motor: 5/5 throughout Sensory: Intact to light touch and temperature  Pertinent Labs: LDL 73(previously on atorvastatin '80mg'$  qhs) A1C pending  Impression: [name] with a history of DVT, hyperlipidemia, htn who presents with 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 He has an occluded vs severely stenotic left MCA branch, and improved rapidly after TNK.  This could be embolic or thrombotic, he is getting workup.  Given his history I will also perform lower extremity Dopplers.  Recommendations: 1) Echo, continue telemetry 2) MRI brain 3) PT,OT,ST evals 4) consider adding Zetia to meet LDL goal 5) Neurology will follow.   Roland Rack, MD [facility] [phone]  If 7pm- 7am, please page neurology on call as listed in [place].

## 2022-08-13 NOTE — Plan of Care (Signed)
  Problem: Education: Goal: Knowledge of disease or condition will improve Outcome: Progressing   Problem: Ischemic Stroke/TIA Tissue Perfusion: Goal: Complications of ischemic stroke/TIA will be minimized Outcome: Progressing   Problem: Coping: Goal: Will verbalize positive feelings about self Outcome: Progressing   Problem: Self-Care: Goal: Ability to participate in self-care as condition permits will improve Outcome: Progressing Goal: Ability to communicate needs accurately will improve Outcome: Progressing

## 2022-08-13 NOTE — Progress Notes (Signed)
*  PRELIMINARY RESULTS* Echocardiogram 2D Echocardiogram has been performed.  Shaun Anderson 08/13/2022, 3:57 PM

## 2022-08-13 NOTE — Progress Notes (Signed)
   08/13/22 1400  Spiritual Encounters  Type of Visit Initial  Referral source IDT Rounds  Reason for visit Routine spiritual support  OnCall Visit No  Interventions  Spiritual Care Interventions Made Established relationship of care and support;Compassionate presence  Spiritual Care Plan  Spiritual Care Issues Still Outstanding Chaplain will continue to follow   Spoke with PT and family regarding Pastoral Care is available 24/7 and the nurses know how to get in contact with Chaplains incase of a need.

## 2022-08-13 NOTE — Progress Notes (Signed)
Pt transferred to MRI and back without any issues

## 2022-08-13 NOTE — Evaluation (Signed)
Speech Language Pathology Evaluation Patient Details Name: ALPER GUILMETTE MRN: [id number] DOB: 19-Jul-1961 Today's Date: 08/13/2022 Time: 1050-1110 SLP Time Calculation (min) (ACUTE ONLY): 20 min  Problem List:  Patient Active Problem List   Diagnosis Date Noted   CVA (cerebrovascular accident) ([place]) 08/12/2022   Angina decubitus 04/26/2016   Past Medical History:  Past Medical History:  Diagnosis Date   DVT (deep venous thrombosis) ([facility])    Hyperlipidemia    Hypertension    Sleep apnea    STEMI (ST elevation myocardial infarction) [facility])    Past Surgical History:  Past Surgical History:  Procedure Laterality Date   CARDIAC CATHETERIZATION Left 04/26/2016   Procedure: Left Heart Cath and Coronary Angiography;  Surgeon: Corey Skains, MD;  Location: [facility];  Service: Cardiovascular;  Laterality: Left;   COLONOSCOPY WITH PROPOFOL N/A 09/29/2018   Procedure: COLONOSCOPY WITH PROPOFOL;  Surgeon: Manya Silvas, MD;  Location: [facility] ENDOSCOPY;  Service: Endoscopy;  Laterality: N/A;   NO PAST SURGERIES     HPI:  Per H&P "62 y.o. male  with a PMHx significant for HTN, HLD, OSA on CPAP, CAD c/b STEMI s/p stents, prior DVT not currently on AC, polycythemia, ongoing smoking who presents to the ER for evaluation of slurred speech.      Per ED and EMS report, patient had finished eating lunch with his wife around 1 or 1:30 pm when he was last seen normal. He took a nap on the couch and per reports he fell off the couch due to inability to get up around 3:30 pm. EMS was called and Code stroke was called in route.       ED Course: Initial vital signs showed Blood pressure 123/88, pulse 73, temperature 98.1 F (36.7 C),, resp. rate (!) 22, weight 105.9 kg, SpO2 100 %. Patient was immediately evaluated by Neurologist at the bedside. STAT CTH obtained and showed No evidence of acute intracranial abnormality. ASPECTS is 10. Patient was deemed candidate for thromblytics therapy with  initial bolus administered at 17:26 pm. PCCM consulted for admission to ICU for close monitoring post TNK." MRI pending.  Assessment / Plan / Recommendation Clinical Impression  Pt seen for speech/language evaluation. Pt alert, pleasant, and cooperative. Endorsed marked improvement in speech/language since presenting to ED. Wife at bedside. Cleared with RN.   Oral motor examination completed and significant for +upper denture, missing lower dentition, and slight R lingual deviation with protrusion.   Assessment completed via informal means and portions of Western Aphasia Battery Revised - Bedside Record Form. Pt's speech is mainly fluent with occasional, mild hesitation during wordfinding episodes. Communication breakdowns are independently repaired with extra time. Pt demonstrated intact confrontation naming, divergent naming, automatic speech, and basic auditory comprehension. Pt did require extra time to follow complex commands. Additionally, pt demonstrated intact basic functional writing (name, address). Pt denied need for further SLP services at this time.  SLP to sign off as pt has no acute SLP needs.   Pt and wife educated re: role of SLP, results of assessment, recommendations, B.E.F.A.ST. stroke symptoms, and SLP POC. Both verbalized understanding/agreement. RN made aware of results, recommendations, and SLP POC.     SLP Assessment  SLP Recommendation/Assessment: Patient does not need any further Speech May Pathology Services SLP Visit Diagnosis: Cognitive communication deficit (R41.841)    Recommendations for follow up therapy are one component of a multi-disciplinary discharge planning process, led by the attending physician.  Recommendations may be updated based on patient  status, additional functional criteria and insurance authorization.    Follow Up Recommendations  No SLP follow up    Assistance Recommended at Discharge  Intermittent Supervision/Assistance  Functional  Status Assessment Patient has not had a recent decline in their functional status     SLP Evaluation Cognition  Overall Cognitive Status: Within Functional Limits for tasks assessed Arousal/Alertness: Awake/alert Orientation Level: Oriented X4 Awareness: Appears intact Problem Solving: Appears intact Safety/Judgment: Appears intact Comments: full cognitive assessment deferred due to aphasia evaluation       Comprehension  Auditory Comprehension Overall Auditory Comprehension: Appears within functional limits for tasks assessed Yes/No Questions: Within Functional Limits Commands: Within Functional Limits (extra time for complex directions) Conversation: Complex (WFL) EffectiveTechniques: Extra processing time Visual Recognition/Discrimination Discrimination: Not tested Reading Comprehension Reading Status: Not tested    Expression Expression Primary Mode of Expression: Verbal Verbal Expression Overall Verbal Expression: Appears within functional limits for tasks assessed Initiation: No impairment Automatic Speech: Social Response (WFL) Level of Generative/Spontaneous Verbalization: Conversation Repetition: No impairment Naming: No impairment Pragmatics: No impairment Effective Techniques:  (extra time) Other Verbal Expression Comments: pt's speech is mainly fluent with some mild hesitations for low frequency wordfinding - pt able repair communication breakdowns independently with extra time Written Expression Dominant Hand: Right Written Expression: Within Functional Limits (name and address)   Oral / Motor  Oral Motor/Sensory Function Overall Oral Motor/Sensory Function: Mild impairment Facial ROM: Within Functional Limits Lingual Strength: Reduced (slight R lingual deviation) Motor Speech Overall Motor Speech: Appears within functional limits for tasks assessed Respiration: Within functional limits Phonation: Normal Resonance: Within functional limits Articulation:  Within functional limitis Intelligibility: Intelligible Motor Planning: Witnin functional limits           Cherrie Gauze, M.S., [facility] [phone] ([place])   Quintella Baton 08/13/2022, 11:49 AM

## 2022-08-13 NOTE — Progress Notes (Signed)
PT Cancellation Note  Patient Details Name: Shaun Anderson MRN: [id number] DOB: 09/10/1960   Cancelled Treatment:    Reason Eval/Treat Not Completed: Medical issues which prohibited therapy (Patient had TNK at 17:26 on 1/21. Will hold PT evaluation until after 24 hours and repeat imaging per therapy guidelines.)  Minna Merritts, PT, MPT   Percell Locus 08/13/2022, 2:21 PM

## 2022-08-14 DIAGNOSIS — I639 Cerebral infarction, unspecified: Secondary | ICD-10-CM | POA: Diagnosis not present

## 2022-08-14 LAB — BASIC METABOLIC PANEL
Anion gap: 6 (ref 5–15)
BUN: 12 mg/dL (ref 8–23)
CO2: 26 mmol/L (ref 22–32)
Calcium: 9 mg/dL (ref 8.9–10.3)
Chloride: 107 mmol/L (ref 98–111)
Creatinine, Ser: 0.8 mg/dL (ref 0.61–1.24)
GFR, Estimated: >60 mL/min (ref >60)
Glucose, Bld: 96 mg/dL (ref 70–99)
Potassium: 3.9 mmol/L (ref 3.5–5.1)
Sodium: 139 mmol/L (ref 135–145)

## 2022-08-14 LAB — CBC
HCT: 49.2 % (ref 39.0–52.0)
Hemoglobin: 15.9 g/dL (ref 13.0–17.0)
MCH: 29.1 pg (ref 26.0–34.0)
MCHC: 32.3 g/dL (ref 30.0–36.0)
MCV: 89.9 fL (ref 80.0–100.0)
Platelets: 396 10*3/uL (ref 150–400)
RBC: 5.47 MIL/uL (ref 4.22–5.81)
RDW: 13.6 % (ref 11.5–15.5)
WBC: 8.9 10*3/uL (ref 4.0–10.5)
nRBC: 0 % (ref 0.0–0.2)

## 2022-08-14 MED ORDER — CLOPIDOGREL BISULFATE 75 MG PO TABS
300.0000 mg | ORAL_TABLET | Freq: Once | ORAL | Status: AC
  Administered 2022-08-14: 300 mg via ORAL
  Filled 2022-08-14: qty 4

## 2022-08-14 MED ORDER — ASPIRIN 81 MG PO CHEW
81.0000 mg | CHEWABLE_TABLET | Freq: Every day | ORAL | Status: DC
  Administered 2022-08-14: 81 mg via ORAL
  Filled 2022-08-14: qty 1

## 2022-08-14 MED ORDER — CLOPIDOGREL BISULFATE 75 MG PO TABS: 75.0000 mg | ORAL_TABLET | Freq: Every day | ORAL | Status: DC

## 2022-08-14 MED ORDER — CLOPIDOGREL BISULFATE 75 MG PO TABS: 75.0000 mg | ORAL_TABLET | Freq: Every day | ORAL | 0 refills | Status: AC

## 2022-08-14 MED ORDER — ATORVASTATIN CALCIUM 20 MG PO TABS
80.0000 mg | ORAL_TABLET | Freq: Every day | ORAL | Status: DC
  Administered 2022-08-14: 80 mg via ORAL
  Filled 2022-08-14: qty 4

## 2022-08-14 MED ORDER — EZETIMIBE 10 MG PO TABS: 10.0000 mg | ORAL_TABLET | Freq: Every day | ORAL | 2 refills | Status: AC

## 2022-08-14 MED ORDER — EZETIMIBE 10 MG PO TABS
10.0000 mg | ORAL_TABLET | Freq: Every day | ORAL | Status: DC
  Administered 2022-08-14: 10 mg via ORAL
  Filled 2022-08-14: qty 1

## 2022-08-14 NOTE — Progress Notes (Signed)
1320 Discharge teaching done. Questions answered.Discharged home with wife.

## 2022-08-14 NOTE — Evaluation (Signed)
Physical Therapy Evaluation Patient Details Name: Shaun Anderson MRN: [id number] DOB: [birth year] Today's Date: 08/14/2022  History of Present Illness  Patient is a 62 year old male presenting with slurred speech. 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 History of DVT  Clinical Impression  Patient is agreeable to PT evaluation and reports he has not been out of bed yet. He is independent with mobility at baseline and lives with his spouse and son.  Today, the patient is demonstrating high level of functional mobility. No focal weakness is noted in BLE. Patient ambulated around the unit without assistive device with Min guard progressing to supervision. No gross loss of balance with ambulation or dynamic standing activity, however patient is slow and cautious. Recommend for PT to follow up while in the hospital to maximize independence. No PT needs anticipated at discharge.      Recommendations for follow up therapy are one component of a multi-disciplinary discharge planning process, led by the attending physician.  Recommendations may be updated based on patient status, additional functional criteria and insurance authorization.  Follow Up Recommendations No PT follow up      Assistance Recommended at Discharge PRN  Patient can return home with the following  Assist for transportation;Help with stairs or ramp for entrance    Equipment Recommendations None recommended by PT  Recommendations for Other Services       Functional Status Assessment Patient has had a recent decline in their functional status and demonstrates the ability to make significant improvements in function in a reasonable and predictable amount of time.     Precautions / Restrictions Precautions Precautions: Fall Restrictions Weight Bearing Restrictions: No      Mobility  Bed Mobility Overal bed mobility: Modified Independent Bed Mobility: Supine to Sit     Supine  to sit: Modified independent (Device/Increase time), HOB elevated     General bed mobility comments: no dizziness with upright activity    Transfers Overall transfer level: Modified independent Equipment used: None               General transfer comment: for sit to stand and stand step transfers. good safety awareness    Ambulation/Gait Ambulation/Gait assistance: Supervision, Min guard Gait Distance (Feet): 200 Feet Assistive device: None Gait Pattern/deviations: Step-through pattern       General Gait Details: slow but steady cadence with no loss of balance without assistive device. Min guard initially progressing to supervision with increased ambulation distance. patient reports feeling 90% with ambulation, no dizziness reported but just reports feeling "off"  Stairs            Wheelchair Mobility    Modified Rankin (Stroke Patients Only) Modified Rankin (Stroke Patients Only) Pre-Morbid Rankin Score: No symptoms Modified Rankin: No significant disability     Balance Overall balance assessment: Needs assistance Sitting-balance support: Feet supported, No upper extremity supported Sitting balance-Leahy Scale: Normal     Standing balance support: No upper extremity supported Standing balance-Leahy Scale: Fair Standing balance comment: slow and cautious with ambulation. no gross loss of balance but does not feel back to baseline                             Pertinent Vitals/Pain Pain Assessment Pain Assessment: No/denies pain    Home Living Family/patient expects to be discharged to:: Private residence Living Arrangements: Spouse/significant other;Children (spouse and 52 year old son) Available Help at  Discharge: Family Type of Home: House Home Access: Stairs to enter   Technical brewer of Steps: 4   Home Layout: Able to live on main level with bedroom/bathroom        Prior Function Prior Level of Function :  Independent/Modified Independent (retired)                     Journalist, newspaper   Dominant Hand: Right    Extremity/Trunk Assessment   Upper Extremity Assessment Upper Extremity Assessment: Overall WFL for tasks assessed    Lower Extremity Assessment Lower Extremity Assessment: Overall WFL for tasks assessed (5/5 BLE strength with dorsiflexion, plantarflexion, knee extension, hip add/abd)       Communication   Communication: Expressive difficulties (intermittent delay, ? occasional trouble with word finding)  Cognition Arousal/Alertness: Awake/alert Behavior During Therapy: WFL for tasks assessed/performed Overall Cognitive Status: Within Functional Limits for tasks assessed                                          General Comments General comments (skin integrity, edema, etc.): encouraged patient to progress activity slowly for safety at home    Exercises     Assessment/Plan    PT Assessment Patient needs continued PT services  PT Problem List Decreased mobility;Decreased balance;Decreased activity tolerance       PT Treatment Interventions DME instruction;Gait training;Stair training;Functional mobility training;Therapeutic activities;Therapeutic exercise;Balance training;Neuromuscular re-education;Cognitive remediation;Patient/family education    PT Goals (Current goals can be found in the Care Plan section)  Acute Rehab PT Goals Patient Stated Goal: to go home PT Goal Formulation: With patient Time For Goal Achievement: 08/28/22 Potential to Achieve Goals: Good Additional Goals Additional Goal #1: will maintain dynamic standing balance while perfoming a functional task outside base of support with Mod I in preparation for home and community mobility    Frequency Min 2X/week     Co-evaluation               AM-PAC PT "6 Clicks" Mobility  Outcome Measure Help needed turning from your back to your side while in a flat bed without  using bedrails?: None Help needed moving from lying on your back to sitting on the side of a flat bed without using bedrails?: None Help needed moving to and from a bed to a chair (including a wheelchair)?: None Help needed standing up from a chair using your arms (e.g., wheelchair or bedside chair)?: None Help needed to walk in hospital room?: A Little Help needed climbing 3-5 steps with a railing? : A Little 6 Click Score: 22    End of Session Equipment Utilized During Treatment: Gait belt Activity Tolerance: Patient tolerated treatment well Patient left: in chair;with call bell/phone within reach;with family/visitor present (spouse present) Nurse Communication: Mobility status PT Visit Diagnosis: Other abnormalities of gait and mobility (R26.89);Unsteadiness on feet (R26.81)    Time: 9323-5573 PT Time Calculation (min) (ACUTE ONLY): 27 min   Charges:   PT Evaluation $PT Eval Low Complexity: 1 Low PT Treatments $Therapeutic Activity: 8-22 mins        Minna Merritts, PT, MPT   Percell Locus 08/14/2022, 9:09 AM

## 2022-08-14 NOTE — Plan of Care (Signed)
  Problem: Education: Goal: Knowledge of patient specific risk factors will improve [place] N/A or DELETE if not current risk factor) Outcome: Progressing   Problem: Health Behavior/Discharge Planning: Goal: Ability to manage health-related needs will improve Outcome: Progressing   Problem: Self-Care: Goal: Verbalization of feelings and concerns over difficulty with self-care will improve Outcome: Progressing   Problem: Nutrition: Goal: Dietary intake will improve Outcome: Completed/Met

## 2022-08-14 NOTE — TOC Initial Note (Signed)
Transition of Care [facility]) - Initial/Assessment Note    Patient Details  Name: Shaun Anderson MRN: [id number] Date of Birth: 07/27/1960  Transition of Care [facility]) CM/SW Contact:    Shelbie Hutching, RN Phone Number: 08/14/2022, 11:08 AM  Clinical Narrative:                  Transition of Care [facility]) Screening Note   Patient Details  Name: Shaun Anderson Date of Birth: 1961/02/08   Transition of Care [facility]) CM/SW Contact:    Shelbie Hutching, RN Phone Number: 08/14/2022, 11:08 AM    Transition of Care Department [facility]) has reviewed patient and no TOC needs have been identified at this time. We will continue to monitor patient advancement through interdisciplinary progression rounds. If new patient transition needs arise, please place a TOC consult.          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

## 2022-08-14 NOTE — Progress Notes (Signed)
Subjective: Consintues to improve  Exam: Vitals:   08/14/22 0700 08/14/22 0800  BP:  (!) 139/92  Pulse: (!) 54 69  Resp: 19 20  Temp:    SpO2: 93% 96%   Gen: In bed, NAD Resp: non-labored breathing, no acute distress Abd: soft, nt  Neuro: MS: Awake, alert, improved repetition, fluent speech CN: EO MI, visual fields full Motor: 5/5 throughout Sensory: Intact to light touch and temperature  Pertinent Labs: LDL 73(previously on atorvastatin '80mg'$  qhs) A1C 5.9 Echo - mildly dilated LA LE doppler - no evidence of DVT  Impression: [name] with a history of DVT, hyperlipidemia, htn who presents with 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 He has an occluded vs severely stenotic left MCA branch, and improved rapidly after TNK.  This could be intrinsic disease vs re-canalizing embolus.   Recommendations: 1) counseled smoking cessation 2) continue atorvastatin '80mg'$  QHS(home med) 3) consider adding Zetia to meet LDL goal 4) ok to resume home BPs gradually starting tomorrow, would resume toprol first, followed by losartan a few days later.  5) ASA '81mg'$  daily + plavix '75mg'$  daily after '300mg'$  load.  6) PT,OT,ST 7) would favor prolonged cardiac monitoring 8) f/u with outpatient neurology  Roland Rack, MD [facility] [phone]  If 7pm- 7am, please page neurology on call as listed in [place].

## 2022-08-14 NOTE — Discharge Summary (Signed)
[facility] Discharge Summary   Patient: Shaun Anderson DTO:[id number]  PCP: Tracie Harrier, MD  Date of admission: 08/12/2022   Date of discharge:  08/14/2022     Discharge Diagnoses:  Principal Problem:   CVA (cerebrovascular accident) [facility])   Admitted From: Home Disposition:  Home   Recommendations for Outpatient Follow-up:  Follow-up with PCP in 1 week, continue to monitor BP and heart rate at home.  Continue aspirin, started Plavix for 21 days.  Continued Lipitor, started Zetia for 3 months for now, check lipid panel after 3 months. Follow-up with cardiology for Zio patch to rule out occult arrhythmias Follow-up with neurology in 1 to 2 weeks for further management. Follow up LABS/TEST: Cardiology follow-up for Zio patch   Follow-up Information     Scheryl Marten, PA-C. Go in 3 week(s).   Specialty: Cardiology Contact information: [place] [id number] [phone]                Diet recommendation: Cardiac diet  Activity: The patient is advised to gradually reintroduce usual activities, as tolerated  Discharge Condition: stable  Code Status: Full code   History of present illness: As per the H and P dictated on admission Hospital Course:  62 y.o. male  with a PMHx significant for HTN, HLD, OSA on CPAP, CAD c/b STEMI s/p stents, prior DVT LLE currently on AC, polycythemia, ongoing smoking who presents to the ER for evaluation of slurred speech.  Per ED and EMS report, patient had finished eating lunch with his wife around 1 or 1:30 pm when he was last seen normal. He took a nap on the couch and per reports he fell off the couch due to inability to get up around 3:30 pm. EMS was called and Code stroke was called in route.   ED Course: Initial vital signs showed Blood pressure 123/88, pulse 73, temperature 98.1 F (36.7 C),, resp. rate (!) 22, weight 105.9 kg, SpO2 100 %. Patient was immediately evaluated by Neurologist at the  bedside. STAT CTH obtained and showed No evidence of acute intracranial abnormality. ASPECTS is 10. Patient was deemed candidate for thromblytics therapy with initial bolus administered at 17:26 pm. PCCM consulted for admission to ICU for close monitoring post TNK. 1/21 admitted for acute CVA, CODE STROKE s/p TNK  Assessment and Plan: # Left M2/M3 MCA stroke, S/p Post IV TPA for Ischemic Stroke  Vital signs/blood pressure with neuro checks/NIHSS  per tPA protocol for the first 24 hours  No lines, catheters, antiplatelet or anticoagulants x24 hr after IV tPA unless required.  keep BP <185/100 with short-acting antihypertensives (Labetolol, Hydralazine or Nicardipine Gtt as needed).  fasting Lipid Panel with Direct LDL 73, HbA1c 5.9, TSH 1.1 MRI brain: Acute infarct in the left parieto-occipital cortex  CTA H/N: 1. Severely stenotic versus occluded left distal M2/proximal M3 MCA. 2. In the region of the vessel described above there is an approximately 19 mL area of core infarct with 3 mL of surrounding penumbra. On discussion with Dr. Curly Shores the core may not be validated given the clinical timeframe. 3. Moderate right vertebral artery origin stenosis. TTE: LVEF 55 to 60%, no wall motion abnormality.  Negative PFO Neurology recommended aspirin and Plavix for 3 weeks followed by monotherapy aspirin 81 mg p.o. daily.  Lipitor 80 mg p.o. daily.  Patient does not need Eliquis, patient has chronic lower extremity DVT and he was taken off of the Eliquis in the past. Patient was seen  by PT and OT, recommended no needs. # Cocaine use disorder: UDS positive for cocaine. Drug abuse abstinence counseling done. # Nicotine dependence, patient smokes cigarettes.  Smoking cessation counseling done. # CAD, Hx of STEMI + PCI s/p Stent, resumed aspirin, patient was prescribed Plavix 75 mg p.o. daily for 3 weeks as per neurology recommendation.  Continue Lipitor. # HLD,  Goal LDL<70, LDL level 73 at lower end, continue  atorvastatin '80mg'$  PO qhs, added Zetia as per neurology recommendation. # HTN, resumed home medications on discharge.  Patient was advised to monitor BP and follow with PCP to titrate medications accordingly. # Left Lower Extremity DVT Venous duplex: 1. No evidence of acute or worsening DVT within the right lower extremity. 2. 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 3. No evidence of acute or chronic DVT within the left lower extremity.  Patient was on Eliquis in the past which has been taken off.  Currently patient is not on Eliquis and no need of Eliquis as per neurology. # OSA on CPAP, continue CPAP at bedtime Body mass index is 36.57 kg/m.  Nutrition Interventions:   Patient was seen by physical therapy, who recommended no therapy needed on discharge, On the day of the discharge the patient's vitals were stable, and no other acute medical condition were reported by patient. the patient was felt safe to be discharge at Home.  Consultants: Neurology Procedures: s/p TNK  Discharge Exam: General: Appear in no distress, no Rash; Oral Mucosa Clear, moist. Cardiovascular: S1 and S2 Present, no Murmur, Respiratory: normal respiratory effort, Bilateral Air entry present and no Crackles, no wheezes Abdomen: Bowel Sound present, Soft and no tenderness, no hernia Extremities: no Pedal edema, no calf tenderness Neurology: alert and oriented to time, place, and person affect appropriate.  Filed Weights   08/12/22 1715  Weight: 105.9 kg   Vitals:   08/14/22 0900 08/14/22 1000  BP:  136/67  Pulse: 61 69  Resp: 17 (!) 21  Temp:    SpO2: 94% 95%    DISCHARGE MEDICATION: Allergies as of 08/14/2022       Reactions   Lisinopril Rash        Medication List     STOP taking these medications    Apixaban Starter Pack ('10mg'$  and '5mg'$ ) Commonly known as: ELIQUIS STARTER PACK    HYDROcodone-acetaminophen 5-325 MG tablet Commonly known as: NORCO/VICODIN   metoprolol succinate 25 MG 24 hr tablet Commonly known as: Toprol XL       TAKE these medications    aspirin EC 81 MG tablet Take 81 mg by mouth daily as needed for mild pain, moderate pain or fever.   atorvastatin 80 MG tablet Commonly known as: Lipitor Take 1 tablet (80 mg total) by mouth daily.   clopidogrel 75 MG tablet Commonly known as: Plavix Take 1 tablet (75 mg total) by mouth daily for 21 days.   ezetimibe 10 MG tablet Commonly known as: ZETIA Take 1 tablet (10 mg total) by mouth daily.   losartan 50 MG tablet Commonly known as: COZAAR Take 50 mg by mouth daily.       Allergies  Allergen Reactions   Lisinopril Rash   Discharge Instructions     Call MD for:   Complete by: As directed    Any headache or dizziness, problems with speech or swallowing, any focal deficit.  Weakness or numbness.   Call MD for:  difficulty breathing,  headache or visual disturbances   Complete by: As directed    Call MD for:  extreme fatigue   Complete by: As directed    Call MD for:  persistant dizziness or light-headedness   Complete by: As directed    Call MD for:  persistant nausea and vomiting   Complete by: As directed    Call MD for:  severe uncontrolled pain   Complete by: As directed    Diet - low sodium heart healthy   Complete by: As directed    Discharge instructions   Complete by: As directed    Follow-up with PCP in 1 week, continue to monitor BP and heart rate at home.  Continue aspirin, started Plavix for 21 days.  Continued Lipitor, started Zetia for 3 months for now, check lipid panel after 3 months. Follow-up with cardiology for Zio patch to rule out occult arrhythmias Follow-up with neurology in 1 to 2 weeks for further management.   Increase activity slowly   Complete by: As directed        The results of significant diagnostics from this hospitalization (including  imaging, microbiology, ancillary and laboratory) are listed below for reference.    Significant Diagnostic Studies: MR BRAIN WO CONTRAST  Result Date: 08/13/2022 CLINICAL DATA:  Aphasia EXAM: MRI HEAD WITHOUT CONTRAST TECHNIQUE: Multiplanar, multiecho pulse sequences of the brain and surrounding structures were obtained without intravenous contrast. COMPARISON:  No prior MRI, correlation is made with CT head 08/12/2022 FINDINGS: Brain: Restricted diffusion with ADC correlate in the left parietal and occipital cortex (series 9, images 18-27). This area is associated with mildly increased T2 hyperintense signal, likely cytotoxic edema. No acute hemorrhage, mass, mass effect, or midline shift. No hemosiderin deposition to suggest remote hemorrhage. No hydrocephalus or extra-axial collection. Normal pituitary and craniocervical junction. Scattered T2 hyperintense signal in the periventricular white matter, likely the sequela of mild chronic small vessel ischemic disease. Vascular: Normal arterial flow voids. Skull and upper cervical spine: Normal marrow signal. Sinuses/Orbits: Mucosal thickening in the maxillary sinuses and ethmoid air cells. No acute finding in the orbits. Other: The mastoids are well aerated. IMPRESSION: Acute infarct in the left parieto-occipital cortex. These results will be called to the ordering clinician or representative by the Radiologist Assistant, and communication documented in the PACS or [employer]. Electronically Signed   By: Merilyn Baba M.D.   On: 08/13/2022 17:53   ECHOCARDIOGRAM COMPLETE  Result Date: 08/13/2022    ECHOCARDIOGRAM REPORT   Patient Name:   ARRIAN MANSON Date of Exam: 08/13/2022 Medical Rec #:  [id number]         Height:       67.0 in Accession #:    [id number]        Weight:       233.5 lb Date of Birth:  1961/06/27        BSA:          2.160 m Patient Age:    62 years          BP:           118/90 mmHg Patient Gender: M                 HR:            61 bpm. Exam Location:  [facility] Procedure: 2D Echo, Cardiac Doppler and Color Doppler Indications:     Stroke  History:         Patient has no prior  history of Echocardiogram examinations.                  Angina; Stroke.  Sonographer:     Wenda Low Referring Phys:  [id number] Flora Lipps Diagnosing Phys: Kathlyn Sacramento MD IMPRESSIONS  1. Left ventricular ejection fraction, by estimation, is 55 to 60%. The left ventricle has normal function. The left ventricle has no regional wall motion abnormalities. The left ventricular internal cavity size was mildly dilated. Left ventricular diastolic parameters were normal.  2. Right ventricular systolic function is normal. The right ventricular size is normal. Tricuspid regurgitation signal is inadequate for assessing PA pressure.  3. Left atrial size was mildly dilated.  4. The mitral valve is normal in structure. No evidence of mitral valve regurgitation. No evidence of mitral stenosis.  5. The aortic valve is normal in structure. Aortic valve regurgitation is not visualized. No aortic stenosis is present.  6. The inferior vena cava is normal in size with greater than 50% respiratory variability, suggesting right atrial pressure of 3 mmHg. FINDINGS  Left Ventricle: Left ventricular ejection fraction, by estimation, is 55 to 60%. The left ventricle has normal function. The left ventricle has no regional wall motion abnormalities. The left ventricular internal cavity size was mildly dilated. There is  no left ventricular hypertrophy. Left ventricular diastolic parameters were normal. Right Ventricle: The right ventricular size is normal. No increase in right ventricular wall thickness. Right ventricular systolic function is normal. Tricuspid regurgitation signal is inadequate for assessing PA pressure. Left Atrium: Left atrial size was mildly dilated. Right Atrium: Right atrial size was normal in size. Pericardium: There is no evidence of pericardial effusion. Mitral Valve:  The mitral valve is normal in structure. No evidence of mitral valve regurgitation. No evidence of mitral valve stenosis. MV peak gradient, 3.6 mmHg. The mean mitral valve gradient is 1.0 mmHg. Tricuspid Valve: The tricuspid valve is normal in structure. Tricuspid valve regurgitation is trivial. No evidence of tricuspid stenosis. Aortic Valve: The aortic valve is normal in structure. Aortic valve regurgitation is not visualized. No aortic stenosis is present. Aortic valve mean gradient measures 3.0 mmHg. Aortic valve peak gradient measures 5.7 mmHg. Aortic valve area, by VTI measures 4.11 cm. Pulmonic Valve: The pulmonic valve was normal in structure. Pulmonic valve regurgitation is not visualized. No evidence of pulmonic stenosis. Aorta: The aortic root is normal in size and structure. Venous: The inferior vena cava is normal in size with greater than 50% respiratory variability, suggesting right atrial pressure of 3 mmHg. IAS/Shunts: No atrial level shunt detected by color flow Doppler.  LEFT VENTRICLE PLAX 2D LVIDd:         6.00 cm   Diastology LVIDs:         4.40 cm   LV e' medial:    7.18 cm/s LV PW:         1.20 cm   LV E/e' medial:  11.1 LV IVS:        1.00 cm   LV e' lateral:   13.30 cm/s LVOT diam:     2.30 cm   LV E/e' lateral: 6.0 LV SV:         107 LV SV Index:   50 LVOT Area:     4.15 cm  RIGHT VENTRICLE RV Basal diam:  3.50 cm RV Mid diam:    3.10 cm RV S prime:     13.30 cm/s TAPSE (M-mode): 3.8 cm LEFT ATRIUM  Index        RIGHT ATRIUM           Index LA diam:        4.20 cm 1.94 cm/m   RA Area:     21.70 cm LA Vol (A2C):   76.5 ml 35.42 ml/m  RA Volume:   67.20 ml  31.12 ml/m LA Vol (A4C):   61.0 ml 28.24 ml/m LA Biplane Vol: 69.1 ml 32.00 ml/m  AORTIC VALVE                    PULMONIC VALVE AV Area (Vmax):    3.88 cm     PV Vmax:       1.05 m/s AV Area (Vmean):   3.93 cm     PV Peak grad:  4.4 mmHg AV Area (VTI):     4.11 cm AV Vmax:           119.00 cm/s AV Vmean:           79.100 cm/s AV VTI:            0.261 m AV Peak Grad:      5.7 mmHg AV Mean Grad:      3.0 mmHg LVOT Vmax:         111.00 cm/s LVOT Vmean:        74.900 cm/s LVOT VTI:          0.258 m LVOT/AV VTI ratio: 0.99  AORTA Ao Root diam: 3.70 cm MITRAL VALVE MV Area (PHT): 4.96 cm    SHUNTS MV Area VTI:   3.31 cm    Systemic VTI:  0.26 m MV Peak grad:  3.6 mmHg    Systemic Diam: 2.30 cm MV Mean grad:  1.0 mmHg MV Vmax:       0.95 m/s MV Vmean:      47.0 cm/s MV Decel Time: 153 msec MV E velocity: 79.60 cm/s MV A velocity: 67.30 cm/s MV E/A ratio:  1.18 Kathlyn Sacramento MD Electronically signed by Kathlyn Sacramento MD Signature Date/Time: 08/13/2022/4:00:09 PM    Final    US Venous Img Lower Bilateral (DVT)  Result Date: 08/13/2022 CLINICAL DATA:  History of previous DVT, on anticoagulation. Evaluate for acute or chronic DVT. EXAM: BILATERAL LOWER EXTREMITY VENOUS DOPPLER ULTRASOUND TECHNIQUE: Gray-scale sonography with graded compression, as well as color Doppler and duplex ultrasound were performed to evaluate the lower extremity deep venous systems from the level of the common femoral vein and including the common femoral, femoral, profunda femoral, popliteal and calf veins including the posterior tibial, peroneal and gastrocnemius veins when visible. The superficial great saphenous vein was also interrogated. Spectral Doppler was utilized to evaluate flow at rest and with distal augmentation maneuvers in the common femoral, femoral and popliteal veins. 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 FINDINGS: RIGHT LOWER EXTREMITY Common Femoral Vein: No evidence of thrombus. Normal compressibility, respiratory phasicity and response to  augmentation. Saphenofemoral Junction: No evidence of thrombus. Normal compressibility and flow on color Doppler imaging. Profunda Femoral Vein: No evidence of thrombus. Normal compressibility and flow on color Doppler imaging. Femoral Vein: Once again, the mid aspect the right femoral vein appears partially duplicated. There is grossly unchanged nonocclusive wall thickening/chronic DVT involving 1 of  the paired divisions of the mid aspect the right femoral vein (image 48), as well as the distal (image 50) aspects of the femoral vein, unchanged compared to the 03/2022 examination. Popliteal Vein: Redemonstrated nonocclusive wall thickening/chronic DVT involving the right popliteal vein (image 52 through 57), similar to the 03/2022 examination. Calf Veins: No evidence of thrombus. Normal compressibility and flow on color Doppler imaging. Superficial Great Saphenous Vein: No evidence of thrombus. Normal compressibility. Other Findings:  None. LEFT LOWER EXTREMITY Common Femoral Vein: No evidence of thrombus. Normal compressibility, respiratory phasicity and response to augmentation. Saphenofemoral Junction: No evidence of thrombus. Normal compressibility and flow on color Doppler imaging. Profunda Femoral Vein: No evidence of thrombus. Normal compressibility and flow on color Doppler imaging. Femoral Vein: No evidence of thrombus. Normal compressibility, respiratory phasicity and response to augmentation. Popliteal Vein: No evidence of thrombus. Normal compressibility, respiratory phasicity and response to augmentation. Calf Veins: No evidence of thrombus. Normal compressibility and flow on color Doppler imaging. Superficial Great Saphenous Vein: No evidence of thrombus. Normal compressibility. Other Findings:  None. IMPRESSION: 1. No evidence of acute or worsening DVT within the right lower extremity. 2. Nonocclusive wall thickening/chronic DVT involving the right femoral and popliteal veins, grossly unchanged  compared to the 07/2019 examination. 3. No evidence of acute or chronic DVT within the left lower extremity. Electronically Signed   By: Sandi Mariscal M.D.   On: 08/13/2022 12:30   CT ANGIO HEAD NECK W WO CM W PERF (CODE STROKE)  Result Date: 08/12/2022 CLINICAL DATA:  Neuro deficit, acute, stroke suspected EXAM: CT ANGIOGRAPHY HEAD AND NECK CT PERFUSION BRAIN TECHNIQUE: Multidetector CT imaging of the head and neck was performed using the standard protocol during bolus administration of intravenous contrast. Multiplanar CT image reconstructions and MIPs were obtained to evaluate the vascular anatomy. Carotid stenosis measurements (when applicable) are obtained utilizing NASCET criteria, using the distal internal carotid diameter as the denominator. Multiphase CT imaging of the brain was performed following IV bolus contrast injection. Subsequent parametric perfusion maps were calculated using RAPID software. RADIATION DOSE REDUCTION: This exam was performed according to the departmental dose-optimization program which includes automated exposure control, adjustment of the mA and/or kV according to patient size and/or use of iterative reconstruction technique. CONTRAST:  166m OMNIPAQUE IOHEXOL 350 MG/ML SOLN COMPARISON:  Same day CT head. FINDINGS: CTA NECK FINDINGS Aortic arch: Great vessel origins are patent. Right carotid system: Atherosclerosis at the carotid bifurcation without greater than 50% stenosis. Left carotid system: Atherosclerosis at the carotid bifurcation without greater than 50% stenosis. Retropharyngeal course. Vertebral arteries: Moderate right vertebral artery origin stenosis. The right vertebral artery is dominant. The left vertebral artery is small/hypoplastic throughout its course. Skeleton: No acute findings. Other neck: No acute findings. Subcentimeter right thyroid nodule which does not require further imaging follow-up (ref: J Am Coll Radiol. 2015 Feb;12(2): 143-50). Upper chest: Clear  lung apices. Review of the MIP images confirms the above findings CTA HEAD FINDINGS Anterior circulation: The intracranial ICAs and proximal MCAs and ACAs are patent. Severely stenotic versus occluded left distal M2/proximal M3 MCA (for example see series 6, images 84, 85, and 86). Posterior circulation: Bilateral intradural vertebral arteries and basilar artery are patent. The left intradural vertebral artery is non dominant/small. Prominent bilateral posterior communicating arteries with small P1 PCAs, anatomic variant. The posterior cerebral arteries are patent without proximal hemodynamically significant stenosis. Venous sinuses: As permitted by contrast timing, patent. Review of the MIP images confirms the above findings CT Brain  Perfusion Findings: ASPECTS: 10. CBF (<30%) Volume: 42m Perfusion (Tmax>6.0s) volume: 244mMismatch Volume: 80m94mnfarction Location:Posterior left MCA territory. IMPRESSION: 1. Severely stenotic versus occluded left distal M2/proximal M3 MCA. 2. In the region of the vessel described above there is an approximately 19 mL area of core infarct with 3 mL of surrounding penumbra. On discussion with Dr. BhaCurly Shorese core may not be validated given the clinical timeframe. 3. Moderate right vertebral artery origin stenosis. Findings discussed with Dr. RobQuentin Cornwall 5:33 p.m. Findings also discussed with Dr. BhaCurly Shoresa telephone at 5:34 PM. Electronically Signed   By: FreMargaretha SheffieldD.   On: 08/12/2022 17:43   CT HEAD CODE STROKE WO CONTRAST  Result Date: 08/12/2022 CLINICAL DATA:  Code stroke.  Neuro deficit, acute, stroke suspected EXAM: CT HEAD WITHOUT CONTRAST TECHNIQUE: Contiguous axial images were obtained from the base of the skull through the vertex without intravenous contrast. RADIATION DOSE REDUCTION: This exam was performed according to the departmental dose-optimization program which includes automated exposure control, adjustment of the mA and/or kV according to patient size  and/or use of iterative reconstruction technique. COMPARISON:  None Available. FINDINGS: Brain: No evidence of acute large vascular territory infarction, hemorrhage, hydrocephalus, extra-axial collection or mass lesion/mass effect. Vascular: No hyperdense vessel identified. Calcific atherosclerosis. Skull: No acute fracture. Sinuses/Orbits: Paranasal sinus mucosal thickening. Debris within the nasal cavity, partially imaged. Other: No mastoid effusions. Left scalp radiology okay thank you and kidney also assigned a CTA for me of to me in [place] AG[place] to thank you ASPECTS ([facility] Program Early CT Score) total score (0-10 with 10 being normal): 10 IMPRESSION: 1. No evidence of acute intracranial abnormality. 2. ASPECTS is 10. Electronically Signed   By: FreMargaretha SheffieldD.   On: 08/12/2022 17:33    Microbiology: Recent Results (from the past 240 hour(s))  Resp panel by RT-PCR (RSV, Flu A&B, Covid) Anterior Nasal Swab     Status: None   Collection Time: 08/12/22  6:11 PM   Specimen: Anterior Nasal Swab  Result Value Ref Range Status   SARS Coronavirus 2 by RT PCR NEGATIVE NEGATIVE Final    Comment: (NOTE) SARS-CoV-2 target nucleic acids are NOT DETECTED.  The SARS-CoV-2 RNA is generally detectable in upper respiratory specimens during the acute phase of infection. The lowest concentration of SARS-CoV-2 viral copies this assay can detect is 138 copies/mL. A negative result does not preclude SARS-Cov-2 infection and should not be used as the sole basis for treatment or other patient management decisions. A negative result may occur with  improper specimen collection/handling, submission of specimen other than nasopharyngeal swab, presence of viral mutation(s) within the areas targeted by this assay, and inadequate number of viral copies(<138 copies/mL). A negative result must be combined with clinical observations, patient history, and epidemiological information. The  expected result is Negative.  Fact Sheet for Patients:  httEntrepreneurPulse.com.auact Sheet for Healthcare Providers:  httIncredibleEmployment.behis test is no t yet approved or cleared by the [employer] and  has been authorized for detection and/or diagnosis of SARS-CoV-2 by FDA under an Emergency Use Authorization (EUA). This EUA will remain  in effect (meaning this test can be used) for the duration of the COVID-19 declaration under Section 564(b)(1) of the Act, 21 U.S.C.section 360bbb-3(b)(1), unless the authorization is terminated  or revoked sooner.       Influenza A by PCR NEGATIVE NEGATIVE Final   Influenza B by PCR NEGATIVE NEGATIVE Final  Comment: (NOTE) The Xpert Xpress SARS-CoV-2/FLU/RSV plus assay is intended as an aid in the diagnosis of influenza from Nasopharyngeal swab specimens and should not be used as a sole basis for treatment. Nasal washings and aspirates are unacceptable for Xpert Xpress SARS-CoV-2/FLU/RSV testing.  Fact Sheet for Patients: EntrepreneurPulse.com.au  Fact Sheet for Healthcare Providers: IncredibleEmployment.be  This test is not yet approved or cleared by the [place] FDA and has been authorized for detection and/or diagnosis of SARS-CoV-2 by FDA under an Emergency Use Authorization (EUA). This EUA will remain in effect (meaning this test can be used) for the duration of the COVID-19 declaration under Section 564(b)(1) of the Act, 21 U.S.C. section 360bbb-3(b)(1), unless the authorization is terminated or revoked.     Resp Syncytial Virus by PCR NEGATIVE NEGATIVE Final    Comment: (NOTE) Fact Sheet for Patients: EntrepreneurPulse.com.au  Fact Sheet for Healthcare Providers: IncredibleEmployment.be  This test is not yet approved or cleared by the [place] FDA and has been authorized for detection and/or  diagnosis of SARS-CoV-2 by FDA under an Emergency Use Authorization (EUA). This EUA will remain in effect (meaning this test can be used) for the duration of the COVID-19 declaration under Section 564(b)(1) of the Act, 21 U.S.C. section 360bbb-3(b)(1), unless the authorization is terminated or revoked.  Performed at [facility], [address] [place], [place] [address]   MRSA Next Gen by PCR, Nasal     Status: None   Collection Time: 08/12/22  8:31 PM   Specimen: Nasal Mucosa; Nasal Swab  Result Value Ref Range Status   MRSA by PCR Next Gen NOT DETECTED NOT DETECTED Final    Comment: (NOTE) The GeneXpert MRSA Assay (FDA approved for NASAL specimens only), is one component of a comprehensive MRSA colonization surveillance program. It is not intended to diagnose MRSA infection nor to guide or monitor treatment for MRSA infections. Test performance is not FDA approved in patients less than 3 years old. Performed at [facility], [address] [place], [place] [address]      Labs: CBC: Recent Labs  Lab 08/12/22 1644 08/13/22 0443 08/14/22 0352  WBC 9.5 9.0 8.9  NEUTROABS 6.0  --   --   HGB 15.6 15.6 15.9  HCT 46.7 48.3 49.2  MCV 89.3 90.6 89.9  PLT 428* 417* 759   Basic Metabolic Panel: Recent Labs  Lab 08/12/22 1644 08/13/22 0443 08/14/22 0352  NA 136 136 139  K 4.1 3.9 3.9  CL 103 104 107  CO2 '28 26 26  '$ GLUCOSE 116* 92 96  BUN '16 12 12  '$ CREATININE 1.01 0.89 0.80  CALCIUM 8.5* 8.5* 9.0  MG  --  2.4  --   PHOS  --  3.1  --    Liver Function Tests: Recent Labs  Lab 08/12/22 1644  AST 21  ALT 22  ALKPHOS 76  BILITOT 0.8  PROT 7.0  ALBUMIN 3.5   No results for input(s): "LIPASE", "AMYLASE" in the last 168 hours. No results for input(s): "AMMONIA" in the last 168 hours. Cardiac Enzymes: No results for input(s): "CKTOTAL", "CKMB", "CKMBINDEX", "TROPONINI" in the last 168 hours. BNP (last 3 results) No results for input(s):  "BNP" in the last 8760 hours. CBG: Recent Labs  Lab 08/12/22 2009  GLUCAP 152*    Time spent: 35 minutes  Signed:  Val Riles  [facility] 08/14/2022 11:48 AM

## 2022-08-14 NOTE — Evaluation (Signed)
Occupational Therapy Evaluation Patient Details Name: Shaun Anderson MRN: [id number] DOB: [birth year] Today's Date: 08/14/2022   History of Present Illness Patient is a 62 year old male presenting with slurred speech. 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 History of DVT   Clinical Impression   Upon entering the room,pt supine in bed sleeping soundly with wife in room. Pt is agreeable to OT evaluation. Pt endorses recent retirement from working with [employer]. Pt performing bed mobility independently. Pt appears to have no residual weakness or coordination deficits at this time. Pt ambulating in room without physical assistance and he reports feeling at baseline level. Pt does not need further acute OT intervention. OT to sign off.      Recommendations for follow up therapy are one component of a multi-disciplinary discharge planning process, led by the attending physician.  Recommendations may be updated based on patient status, additional functional criteria and insurance authorization.   Follow Up Recommendations  No OT follow up     Assistance Recommended at Discharge None        Equipment Recommendations  None recommended by OT       Precautions / Restrictions Precautions Precautions: Fall      Mobility Bed Mobility Overal bed mobility: Modified Independent                  Transfers Overall transfer level: Modified independent Equipment used: None                      Balance Overall balance assessment: Needs assistance Sitting-balance support: Feet supported, No upper extremity supported Sitting balance-Leahy Scale: Normal     Standing balance support: No upper extremity supported Standing balance-Leahy Scale: Good                             ADL either performed or assessed with clinical judgement   ADL Overall ADL's : Independent;Modified independent                                              Vision Patient Visual Report: No change from baseline              Pertinent Vitals/Pain Pain Assessment Pain Assessment: No/denies pain     Hand Dominance Right   Extremity/Trunk Assessment Upper Extremity Assessment Upper Extremity Assessment: Overall WFL for tasks assessed   Lower Extremity Assessment Lower Extremity Assessment: Overall WFL for tasks assessed       Communication Communication Communication: No difficulties   Cognition Arousal/Alertness: Awake/alert Behavior During Therapy: WFL for tasks assessed/performed Overall Cognitive Status: Within Functional Limits for tasks assessed                                                  Home Living Family/patient expects to be discharged to:: Private residence Living Arrangements: Spouse/significant other;Children Available Help at Discharge: Family Type of Home: House Home Access: Stairs to enter Technical brewer of Steps: 4   Home Layout: Able to live on main level with bedroom/bathroom     Bathroom Shower/Tub: Tub/shower unit         Home Equipment: None  Lives With: Spouse;Son    Prior Functioning/Environment Prior Level of Function : Independent/Modified Independent                                 OT Goals(Current goals can be found in the care plan section) Acute Rehab OT Goals Patient Stated Goal: to go home OT Goal Formulation: With patient/family Time For Goal Achievement: 08/14/22 Potential to Achieve Goals: Good  OT Frequency:         AM-PAC OT "6 Clicks" Daily Activity     Outcome Measure Help from another person eating meals?: None Help from another person taking care of personal grooming?: None Help from another person toileting, which includes using toliet, bedpan, or urinal?: None Help from another person bathing (including washing, rinsing, drying)?: None Help from another person to put on and taking off  regular upper body clothing?: None Help from another person to put on and taking off regular lower body clothing?: None 6 Click Score: 24   End of Session Nurse Communication: Mobility status  Activity Tolerance: Patient tolerated treatment well Patient left: in bed;with call bell/phone within reach;with family/visitor present                   Time: 1004-1015 OT Time Calculation (min): 11 min Charges:  OT General Charges $OT Visit: 1 Visit OT Evaluation $OT Eval Low Complexity: 1 Low Darleen Crocker, MS, OTR/L , CBIS ascom [phone]  08/14/22, 12:12 PM

## 2022-09-27 ENCOUNTER — Ambulatory Visit: Payer: Self-pay | Admitting: Neurology

## 2023-04-22 ENCOUNTER — Other Ambulatory Visit: Payer: Self-pay

## 2023-04-22 NOTE — Progress Notes (Signed)
Pre employment urine drug screen was collected for [employer].  Specimen will be sent to [facility] today for further testing.

## 2023-07-01 ENCOUNTER — Ambulatory Visit
Admission: RE | Admit: 2023-07-01 | Discharge: 2023-07-01 | Disposition: A | Discharge status: Home / Self Care | Payer: BC Managed Care – PPO | Source: Ambulatory Visit | Attending: Internal Medicine | Admitting: Internal Medicine

## 2023-07-01 DIAGNOSIS — Z122 Encounter for screening for malignant neoplasm of respiratory organs: Secondary | ICD-10-CM | POA: Diagnosis present

## 2023-07-01 DIAGNOSIS — Z87891 Personal history of nicotine dependence: Secondary | ICD-10-CM | POA: Diagnosis present

## 2023-07-01 DIAGNOSIS — F17210 Nicotine dependence, cigarettes, uncomplicated: Secondary | ICD-10-CM | POA: Insufficient documentation

## 2023-07-15 ENCOUNTER — Other Ambulatory Visit: Payer: Self-pay

## 2023-07-15 DIAGNOSIS — F17210 Nicotine dependence, cigarettes, uncomplicated: Secondary | ICD-10-CM

## 2023-07-15 DIAGNOSIS — Z87891 Personal history of nicotine dependence: Secondary | ICD-10-CM

## 2023-07-15 DIAGNOSIS — Z122 Encounter for screening for malignant neoplasm of respiratory organs: Secondary | ICD-10-CM

## 2023-09-09 ENCOUNTER — Encounter: Payer: Self-pay | Admitting: Gastroenterology

## 2023-09-09 ENCOUNTER — Encounter
Admission: RE | Disposition: A | Discharge status: Home / Self Care | Payer: Self-pay | Source: Home / Self Care | Attending: Gastroenterology

## 2023-09-09 ENCOUNTER — Ambulatory Visit: Payer: 59 | Admitting: Anesthesiology

## 2023-09-09 ENCOUNTER — Ambulatory Visit
Admission: RE | Admit: 2023-09-09 | Discharge: 2023-09-09 | Disposition: A | Discharge status: Home / Self Care | Payer: 59 | Attending: Gastroenterology | Admitting: Gastroenterology

## 2023-09-09 DIAGNOSIS — D120 Benign neoplasm of cecum: Secondary | ICD-10-CM | POA: Insufficient documentation

## 2023-09-09 DIAGNOSIS — I509 Heart failure, unspecified: Secondary | ICD-10-CM | POA: Diagnosis not present

## 2023-09-09 DIAGNOSIS — Z8673 Personal history of transient ischemic attack (TIA), and cerebral infarction without residual deficits: Secondary | ICD-10-CM | POA: Diagnosis not present

## 2023-09-09 DIAGNOSIS — I252 Old myocardial infarction: Secondary | ICD-10-CM | POA: Insufficient documentation

## 2023-09-09 DIAGNOSIS — I110 Hypertensive heart disease with heart failure: Secondary | ICD-10-CM | POA: Insufficient documentation

## 2023-09-09 DIAGNOSIS — K5730 Diverticulosis of large intestine without perforation or abscess without bleeding: Secondary | ICD-10-CM | POA: Insufficient documentation

## 2023-09-09 DIAGNOSIS — Z1211 Encounter for screening for malignant neoplasm of colon: Secondary | ICD-10-CM | POA: Insufficient documentation

## 2023-09-09 DIAGNOSIS — D122 Benign neoplasm of ascending colon: Secondary | ICD-10-CM | POA: Diagnosis not present

## 2023-09-09 DIAGNOSIS — I2510 Atherosclerotic heart disease of native coronary artery without angina pectoris: Secondary | ICD-10-CM | POA: Diagnosis not present

## 2023-09-09 DIAGNOSIS — G4730 Sleep apnea, unspecified: Secondary | ICD-10-CM | POA: Diagnosis not present

## 2023-09-09 DIAGNOSIS — K635 Polyp of colon: Secondary | ICD-10-CM | POA: Insufficient documentation

## 2023-09-09 DIAGNOSIS — Z7902 Long term (current) use of antithrombotics/antiplatelets: Secondary | ICD-10-CM | POA: Insufficient documentation

## 2023-09-09 DIAGNOSIS — Z955 Presence of coronary angioplasty implant and graft: Secondary | ICD-10-CM | POA: Insufficient documentation

## 2023-09-09 DIAGNOSIS — F17200 Nicotine dependence, unspecified, uncomplicated: Secondary | ICD-10-CM | POA: Diagnosis not present

## 2023-09-09 DIAGNOSIS — D124 Benign neoplasm of descending colon: Secondary | ICD-10-CM | POA: Insufficient documentation

## 2023-09-09 HISTORY — DX: Cerebral infarction, unspecified: I63.9

## 2023-09-09 HISTORY — PX: SUBMUCOSAL LIFTING INJECTION: SHX6855

## 2023-09-09 HISTORY — PX: COLONOSCOPY WITH PROPOFOL: SHX5780

## 2023-09-09 HISTORY — PX: SUBMUCOSAL TATTOO INJECTION: SHX6856

## 2023-09-09 HISTORY — PX: POLYPECTOMY: SHX5525

## 2023-09-09 SURGERY — COLONOSCOPY WITH PROPOFOL
Anesthesia: General

## 2023-09-09 MED ORDER — LIDOCAINE HCL (CARDIAC) PF 100 MG/5ML IV SOSY
PREFILLED_SYRINGE | INTRAVENOUS | Status: DC | PRN
  Administered 2023-09-09: 80 mg via INTRAVENOUS

## 2023-09-09 MED ORDER — SODIUM CHLORIDE 0.9 % IV SOLN: INTRAVENOUS | Status: DC

## 2023-09-09 MED ORDER — PROPOFOL 10 MG/ML IV BOLUS
INTRAVENOUS | Status: AC
  Filled 2023-09-09: qty 20

## 2023-09-09 MED ORDER — PHENYLEPHRINE 80 MCG/ML (10ML) SYRINGE FOR IV PUSH (FOR BLOOD PRESSURE SUPPORT)
PREFILLED_SYRINGE | INTRAVENOUS | Status: AC
  Filled 2023-09-09: qty 10

## 2023-09-09 MED ORDER — PROPOFOL 500 MG/50ML IV EMUL
INTRAVENOUS | Status: DC | PRN
  Administered 2023-09-09: 25 mg via INTRAVENOUS
  Administered 2023-09-09: 120 ug/kg/min via INTRAVENOUS
  Administered 2023-09-09: 25 mg via INTRAVENOUS
  Administered 2023-09-09: 100 mg via INTRAVENOUS

## 2023-09-09 MED ORDER — PROPOFOL 10 MG/ML IV BOLUS
INTRAVENOUS | Status: AC
  Filled 2023-09-09: qty 40

## 2023-09-09 MED ORDER — LIDOCAINE HCL (PF) 2 % IJ SOLN
INTRAMUSCULAR | Status: AC
  Filled 2023-09-09: qty 5

## 2023-09-09 NOTE — Op Note (Signed)
 [facility] Gastroenterology Patient Name: Shaun Anderson Procedure Date: 09/09/2023 9:01 AM MRN: [id number] Account #: [id number] Date of Birth: 06-Nov-1960 Admit Type: Outpatient Age: 63 Room: [facility] ENDO ROOM 4 Gender: Male Note Status: Finalized Instrument Name: Colonoscope [id number] Procedure:             Colonoscopy Indications:           High risk colon cancer surveillance: Personal history                         of colonic polyps Providers:             [name] DO, DO Referring MD:          Barbette Reichmann, MD (Referring MD) Medicines:             Monitored Anesthesia Care Complications:         No immediate complications. Estimated blood loss:                         Minimal. Procedure:             Pre-Anesthesia Assessment:                        - Prior to the procedure, a History and Physical was                         performed, and patient medications and allergies were                         reviewed. The patient is competent. The risks and                         benefits of the procedure and the sedation options and                         risks were discussed with the patient. All questions                         were answered and informed consent was obtained.                         Patient identification and proposed procedure were                         verified by the physician, the nurse, the anesthetist                         and the technician in the endoscopy suite. Mental                         Status Examination: alert and oriented. Airway                         Examination: normal oropharyngeal airway and neck                         mobility. Respiratory Examination: clear to  auscultation. CV Examination: RRR, no murmurs, no S3                         or S4. Prophylactic Antibiotics: The patient does not                         require prophylactic antibiotics. Prior                          Anticoagulants: The patient has taken Plavix                         (clopidogrel), last dose was 5 days prior to                         procedure. ASA Grade Assessment: III - A patient with                         severe systemic disease. After reviewing the risks and                         benefits, the patient was deemed in satisfactory                         condition to undergo the procedure. The anesthesia                         plan was to use monitored anesthesia care (MAC).                         Immediately prior to administration of medications,                         the patient was re-assessed for adequacy to receive                         sedatives. The heart rate, respiratory rate, oxygen                         saturations, blood pressure, adequacy of pulmonary                         ventilation, and response to care were monitored                         throughout the procedure. The physical status of the                         patient was re-assessed after the procedure.                        After obtaining informed consent, the colonoscope was                         passed under direct vision. Throughout the procedure,                         the patient's blood pressure, pulse, and oxygen  saturations were monitored continuously. The                         Colonoscope was introduced through the anus and                         advanced to the the cecum, identified by appendiceal                         orifice and ileocecal valve. The colonoscopy was                         performed without difficulty. The patient tolerated                         the procedure well. The quality of the bowel                         preparation was evaluated using the BBPS [facility] Bowel                         Preparation Scale) with scores of: Right Colon = 2                         (minor amount of residual staining, small fragments of                          stool and/or opaque liquid, but mucosa seen well),                         Transverse Colon = 2 (minor amount of residual                         staining, small fragments of stool and/or opaque                         liquid, but mucosa seen well) and Left Colon = 2                         (minor amount of residual staining, small fragments of                         stool and/or opaque liquid, but mucosa seen well). The                         total BBPS score equals 6. The quality of the bowel                         preparation was good. The ileocecal valve, appendiceal                         orifice, and rectum were photographed. Findings:      The perianal and digital rectal examinations were normal. Pertinent       negatives include normal sphincter tone.      Multiple small-mouthed diverticula were found in the sigmoid colon.       Estimated blood loss: none.  Retroflexion in the right colon was performed.      Five sessile polyps were found in the sigmoid colon (3) and descending       colon (2). The polyps were 1 to 2 mm in size. These polyps were removed       with a jumbo cold forceps. Resection and retrieval were complete.       Estimated blood loss was minimal.      Two sessile polyps were found in the cecum. The polyps were 3 to 8 mm in       size. These polyps were removed with a cold snare. Resection and       retrieval were complete. Estimated blood loss was minimal.      An 11 to 14 mm polyp was found in the distal ascending colon. The polyp       was sessile. Area was successfully injected with 2 mL Eleview for lesion       assessment, and this injection appeared to lift the lesion adequately.       Imaging was performed using white light and narrow band imaging to       visualize the mucosa. For performance of EMR to assess margins. The       polyp was removed with a piecemeal technique using a hot snare.       Resection and retrieval were complete.  Area was tattooed with an       injection of 2 mL of [place] ink. Estimated blood loss was minimal.      The exam was otherwise without abnormality on direct and retroflexion       views. Impression:            - Diverticulosis in the sigmoid colon.                        - Five 1 to 2 mm polyps in the sigmoid colon and in                         the descending colon, removed with a jumbo cold                         forceps. Resected and retrieved.                        - Two 3 to 8 mm polyps in the cecum, removed with a                         cold snare. Resected and retrieved.                        - One 11 to 14 mm polyp in the distal ascending colon,                         removed piecemeal using a hot snare. Resected and                         retrieved. Injected. Tattooed.                        - The examination was otherwise normal on direct and  retroflexion views. Recommendation:        - Patient has a contact number available for                         emergencies. The signs and symptoms of potential                         delayed complications were discussed with the patient.                         Return to normal activities tomorrow. Written                         discharge instructions were provided to the patient.                        - Discharge patient to home.                        - Resume previous diet.                        - Continue present medications.                        - No aspirin, ibuprofen, naproxen, or other                         non-steroidal anti-inflammatory drugs for 5 days after                         polyp removal.                        - Resume Plavix (clopidogrel) at prior dose in 2 days.                         Refer to managing physician for further adjustment of                         therapy.                        - Await pathology results.                        - Repeat colonoscopy in 6 months for  surveillance                         after piecemeal polypectomy.                        - Return to referring physician as previously                         scheduled.                        - The findings and recommendations were discussed with                         the patient. Procedure Code(s):     ---  Professional ---                        [phone], Colonoscopy, flexible; with removal of                         tumor(s), polyp(s), or other lesion(s) by snare                         technique                        45380, 59, Colonoscopy, flexible; with biopsy, single                         or multiple                        45381, Colonoscopy, flexible; with directed submucosal                         injection(s), any substance Diagnosis Code(s):     --- Professional ---                        Z86.010, Personal history of colonic polyps                        D12.5, Benign neoplasm of sigmoid colon                        D12.4, Benign neoplasm of descending colon                        D12.0, Benign neoplasm of cecum                        D12.2, Benign neoplasm of ascending colon                        K57.30, Diverticulosis of large intestine without                         perforation or abscess without bleeding CPT copyright 2022 American Medical Association. All rights reserved. The codes documented in this report are preliminary and upon coder review may  be revised to meet current compliance requirements. Attending Participation:      I personally performed the entire procedure. Elfredia Nevins, DO [name] DO, DO 09/09/2023 9:50:10 AM This report has been signed electronically. Number of Addenda: 0 Note Initiated On: 09/09/2023 9:01 AM Scope Withdrawal Time: 0 hours 32 minutes 44 seconds  Total Procedure Duration: 0 hours 37 minutes 0 seconds  Estimated Blood Loss:  Estimated blood loss was minimal.      [facility]

## 2023-09-09 NOTE — Interval H&P Note (Signed)
 History and Physical Interval Note: Preprocedure H&P from 09/09/23  was reviewed and there was no interval change after seeing and examining the patient.  Written consent was obtained from the patient after discussion of risks, benefits, and alternatives. Patient has consented to proceed with Colonoscopy with possible intervention   09/09/2023 8:59 AM  Shaun Anderson  has presented today for surgery, with the diagnosis of Z86.0101 (ICD-10-CM) - Hx of adenomatous colonic polyps.  The various methods of treatment have been discussed with the patient and family. After consideration of risks, benefits and other options for treatment, the patient has consented to  Procedure(s): COLONOSCOPY WITH PROPOFOL (N/A) as a surgical intervention.  The patient's history has been reviewed, patient examined, no change in status, stable for surgery.  I have reviewed the patient's chart and labs.  Questions were answered to the patient's satisfaction.     [name]

## 2023-09-09 NOTE — Anesthesia Preprocedure Evaluation (Addendum)
 Anesthesia Evaluation  Patient identified by MRN, date of birth, ID band Patient awake    Reviewed: Allergy & Precautions, NPO status , Patient's Chart, lab work & pertinent test results  History of Anesthesia Complications Negative for: history of anesthetic complications  Airway Mallampati: III  TM Distance: >3 FB Neck ROM: full    Dental  (+) Partial Lower, Partial Upper   Pulmonary sleep apnea and Continuous Positive Airway Pressure Ventilation , Current Smoker   Pulmonary exam normal        Cardiovascular hypertension, On Medications (-) angina + CAD, + Past MI, + Cardiac Stents and +CHF  (-) DOE Normal cardiovascular exam  Echo 1/24 IMPRESSIONS     1. Left ventricular ejection fraction, by estimation, is 55 to 60%. The  left ventricle has normal function. The left ventricle has no regional  wall motion abnormalities. The left ventricular internal cavity size was  mildly dilated. Left ventricular  diastolic parameters were normal.   2. Right ventricular systolic function is normal. The right ventricular  size is normal. Tricuspid regurgitation signal is inadequate for assessing  PA pressure.   3. Left atrial size was mildly dilated.   4. The mitral valve is normal in structure. No evidence of mitral valve  regurgitation. No evidence of mitral stenosis.   5. The aortic valve is normal in structure. Aortic valve regurgitation is  not visualized. No aortic stenosis is present.   6. The inferior vena cava is normal in size with greater than 50%  respiratory variability, suggesting right atrial pressure of 3 mmHg.     Neuro/Psych CVA  negative psych ROS   GI/Hepatic negative GI ROS, Neg liver ROS,,,  Endo/Other  negative endocrine ROS    Renal/GU negative Renal ROS  negative genitourinary   Musculoskeletal   Abdominal   Peds  Hematology negative hematology ROS (+)   Anesthesia Other Findings Past Medical  History: No date: DVT (deep venous thrombosis) (HCC) No date: Hyperlipidemia No date: Hypertension No date: Sleep apnea No date: STEMI (ST elevation myocardial infarction) [facility])  Past Surgical History: 04/26/2016: CARDIAC CATHETERIZATION; Left     Comment:  Procedure: Left Heart Cath and Coronary Angiography;                Surgeon: Lamar Blinks, MD;  Location: [facility] INVASIVE               CV LAB;  Service: Cardiovascular;  Laterality: Left; 09/29/2018: COLONOSCOPY WITH PROPOFOL; N/A     Comment:  Procedure: COLONOSCOPY WITH PROPOFOL;  Surgeon: Scot Jun, MD;  Location: [facility] ENDOSCOPY;  Service:               Endoscopy;  Laterality: N/A; No date: NO PAST SURGERIES  BMI    Body Mass Index: 38.53 kg/m      Reproductive/Obstetrics negative OB ROS                             Anesthesia Physical Anesthesia Plan  ASA: 3  Anesthesia Plan: General   Post-op Pain Management: Minimal or no pain anticipated   Induction: Intravenous  PONV Risk Score and Plan: 1 and Propofol infusion and TIVA  Airway Management Planned: Natural Airway and Nasal Cannula  Additional Equipment:   Intra-op Plan:   Post-operative Plan:   Informed Consent: I have reviewed the patients History and Physical,  chart, labs and discussed the procedure including the risks, benefits and alternatives for the proposed anesthesia with the patient or authorized representative who has indicated his/her understanding and acceptance.     Dental Advisory Given  Plan Discussed with: Anesthesiologist, CRNA and Surgeon  Anesthesia Plan Comments: (Patient consented for risks of anesthesia including but not limited to:  - adverse reactions to medications - risk of airway placement if required - damage to eyes, teeth, lips or other oral mucosa - nerve damage due to positioning  - sore throat or hoarseness - Damage to heart, brain, nerves, lungs, other parts of body or  loss of life  Patient voiced understanding and assent.)       Anesthesia Quick Evaluation

## 2023-09-09 NOTE — Transfer of Care (Signed)
 Immediate Anesthesia Transfer of Care Note  Patient: Shaun Anderson  Procedure(s) Performed: COLONOSCOPY WITH PROPOFOL POLYPECTOMY SUBMUCOSAL LIFTING INJECTION SUBMUCOSAL TATTOO INJECTION  Patient Location: PACU and Endoscopy Unit  Anesthesia Type:General  Level of Consciousness: awake  Airway & Oxygen Therapy: Patient Spontanous Breathing  Post-op Assessment: Report given to RN and Post -op Vital signs reviewed and stable  Post vital signs: Reviewed and stable  Last Vitals:  Vitals Value Taken Time  BP 110/69 09/09/23 0954  Temp 35.9 C 09/09/23 0951  Pulse 61 09/09/23 0955  Resp    SpO2 100 % 09/09/23 0955  Vitals shown include unfiled device data.  Last Pain:  Vitals:   09/09/23 0951  TempSrc: Temporal  PainSc: Asleep         Complications: No notable events documented.

## 2023-09-09 NOTE — H&P (Signed)
 Pre-Procedure H&P   Patient ID: Shaun Anderson is a 63 y.o. male.  Gastroenterology Provider: [name], DO  Referring Provider: Fransico Setters, NP PCP: Barbette Reichmann, MD  Date: 09/09/2023  HPI Shaun Anderson is a 63 y.o. male who presents today for Colonoscopy for Personal history of colon polyp .  Reports regular bowel movements without melena or hematochezia  Patient last underwent colonoscopy in March 2020 with poor prep on the right colon.  He was found to have 2 tubular adenomas and 1 sessile serrated polyp at that point in time.  He is on Plavix was been held for this procedure  Creatinine 0.9 hemoglobin 15.7 MCV 92 platelets 323,000   Past Medical History:  Diagnosis Date   DVT (deep venous thrombosis) (HCC)    Hyperlipidemia    Hypertension    Sleep apnea    STEMI (ST elevation myocardial infarction) (HCC)    Stroke [facility])     Past Surgical History:  Procedure Laterality Date   CARDIAC CATHETERIZATION Left 04/26/2016   Procedure: Left Heart Cath and Coronary Angiography;  Surgeon: Lamar Blinks, MD;  Location: [facility] INVASIVE CV LAB;  Service: Cardiovascular;  Laterality: Left;   COLONOSCOPY WITH PROPOFOL N/A 09/29/2018   Procedure: COLONOSCOPY WITH PROPOFOL;  Surgeon: Scot Jun, MD;  Location: [facility] ENDOSCOPY;  Service: Endoscopy;  Laterality: N/A;   NO PAST SURGERIES      Family History No h/o GI disease or malignancy  Review of Systems  Constitutional:  Negative for activity change, appetite change, chills, diaphoresis, fatigue, fever and unexpected weight change.  HENT:  Negative for trouble swallowing and voice change.   Respiratory:  Negative for shortness of breath and wheezing.   Cardiovascular:  Negative for chest pain, palpitations and leg swelling.  Gastrointestinal:  Negative for abdominal distention, abdominal pain, anal bleeding, blood in stool, constipation, diarrhea, nausea and vomiting.  Musculoskeletal:  Negative  for arthralgias and myalgias.  Skin:  Negative for color change and pallor.  Neurological:  Negative for dizziness, syncope and weakness.  Psychiatric/Behavioral:  Negative for confusion. The patient is not nervous/anxious.   All other systems reviewed and are negative.    Medications No current facility-administered medications on file prior to encounter.   Current Outpatient Medications on File Prior to Encounter  Medication Sig Dispense Refill   aspirin EC 81 MG tablet Take 81 mg by mouth daily as needed for mild pain, moderate pain or fever.     atorvastatin (LIPITOR) 80 MG tablet Take 1 tablet (80 mg total) by mouth daily. 90 tablet 4   clopidogrel (PLAVIX) 75 MG tablet Take 75 mg by mouth daily.     losartan (COZAAR) 50 MG tablet Take 50 mg by mouth daily.     ezetimibe (ZETIA) 10 MG tablet Take 1 tablet (10 mg total) by mouth daily. 30 tablet 2    Pertinent medications related to GI and procedure were reviewed by me with the patient prior to the procedure   Current Facility-Administered Medications:    0.9 %  sodium chloride infusion, , Intravenous, Continuous, [name], DO, Last Rate: 20 mL/hr at 09/09/23 0845, New Bag at 09/09/23 0845  sodium chloride 20 mL/hr at 09/09/23 0845       Allergies  Allergen Reactions   Lisinopril Rash   Allergies were reviewed by me prior to the procedure  Objective   Body mass index is 38.53 kg/m. Vitals:   09/09/23 0821 09/09/23 0836  BP:  Marland Kitchen)  130/91  Pulse:  62  Resp:  18  Temp:  (!) 96.6 F (35.9 C)  TempSrc:  Temporal  Weight: 111.6 kg   Height: 5\' 7"  (1.702 m)      Physical Exam Vitals and nursing note reviewed.  Constitutional:      General: He is not in acute distress.    Appearance: Normal appearance. He is obese. He is not ill-appearing, toxic-appearing or diaphoretic.  HENT:     Head: Normocephalic and atraumatic.     Nose: Nose normal.     Mouth/Throat:     Mouth: Mucous membranes are moist.      Pharynx: Oropharynx is clear.  Eyes:     General: No scleral icterus.    Extraocular Movements: Extraocular movements intact.  Cardiovascular:     Rate and Rhythm: Regular rhythm. Bradycardia present.     Heart sounds: Normal heart sounds. No murmur heard.    No friction rub. No gallop.  Pulmonary:     Effort: Pulmonary effort is normal. No respiratory distress.     Breath sounds: Normal breath sounds. No wheezing, rhonchi or rales.  Abdominal:     General: Bowel sounds are normal. There is no distension.     Palpations: Abdomen is soft.     Tenderness: There is no abdominal tenderness. There is no guarding or rebound.  Musculoskeletal:     Cervical back: Neck supple.     Right lower leg: No edema.     Left lower leg: No edema.  Skin:    General: Skin is warm and dry.     Coloration: Skin is not jaundiced or pale.  Neurological:     General: No focal deficit present.     Mental Status: He is alert and oriented to person, place, and time. Mental status is at baseline.  Psychiatric:        Mood and Affect: Mood normal.        Behavior: Behavior normal.        Thought Content: Thought content normal.        Judgment: Judgment normal.      Assessment:  Shaun Anderson is a 63 y.o. male  who presents today for Colonoscopy for Personal history of colon polyp .  Plan:  Colonoscopy with possible intervention today  Colonoscopy with possible biopsy, control of bleeding, polypectomy, and interventions as necessary has been discussed with the patient/patient representative. Informed consent was obtained from the patient/patient representative after explaining the indication, nature, and risks of the procedure including but not limited to death, bleeding, perforation, missed neoplasm/lesions, cardiorespiratory compromise, and reaction to medications. Opportunity for questions was given and appropriate answers were provided. Patient/patient representative has verbalized  understanding is amenable to undergoing the procedure.   [name], DO  [facility]  Portions of the record may have been created with voice recognition software. Occasional wrong-word or 'sound-a-like' substitutions may have occurred due to the inherent limitations of voice recognition software.  Read the chart carefully and recognize, using context, where substitutions may have occurred.

## 2023-09-09 NOTE — Anesthesia Postprocedure Evaluation (Signed)
 Anesthesia Post Note  Patient: REISS MOWREY  Procedure(s) Performed: COLONOSCOPY WITH PROPOFOL POLYPECTOMY SUBMUCOSAL LIFTING INJECTION SUBMUCOSAL TATTOO INJECTION  Patient location during evaluation: Endoscopy Anesthesia Type: General Level of consciousness: awake and alert Pain management: pain level controlled Vital Signs Assessment: post-procedure vital signs reviewed and stable Respiratory status: spontaneous breathing, nonlabored ventilation, respiratory function stable and patient connected to nasal cannula oxygen Cardiovascular status: blood pressure returned to baseline and stable Postop Assessment: no apparent nausea or vomiting Anesthetic complications: no   No notable events documented.   Last Vitals:  Vitals:   09/09/23 1013 09/09/23 1023  BP: 133/81 123/78  Pulse: (!) 58 (!) 56  Resp:    Temp:    SpO2: 100%     Last Pain:  Vitals:   09/09/23 1013  TempSrc:   PainSc: 0-No pain                 Louie Boston

## 2023-09-10 LAB — SURGICAL PATHOLOGY

## 2024-01-13 ENCOUNTER — Other Ambulatory Visit: Payer: Self-pay | Admitting: Nurse Practitioner

## 2024-01-13 ENCOUNTER — Ambulatory Visit
Admission: RE | Admit: 2024-01-13 | Discharge: 2024-01-13 | Disposition: A | Discharge status: Home / Self Care | Source: Ambulatory Visit | Attending: Nurse Practitioner | Admitting: Nurse Practitioner

## 2024-01-13 DIAGNOSIS — Z86718 Personal history of other venous thrombosis and embolism: Secondary | ICD-10-CM | POA: Insufficient documentation

## 2024-01-13 DIAGNOSIS — Z87891 Personal history of nicotine dependence: Secondary | ICD-10-CM | POA: Insufficient documentation

## 2024-01-13 DIAGNOSIS — I10 Essential (primary) hypertension: Secondary | ICD-10-CM | POA: Insufficient documentation

## 2024-01-13 DIAGNOSIS — E782 Mixed hyperlipidemia: Secondary | ICD-10-CM

## 2024-01-13 DIAGNOSIS — R600 Localized edema: Secondary | ICD-10-CM

## 2024-02-03 ENCOUNTER — Other Ambulatory Visit: Payer: Self-pay | Admitting: Nurse Practitioner

## 2024-02-03 ENCOUNTER — Encounter (INDEPENDENT_AMBULATORY_CARE_PROVIDER_SITE_OTHER): Payer: Self-pay | Admitting: Nurse Practitioner

## 2024-02-03 ENCOUNTER — Ambulatory Visit (INDEPENDENT_AMBULATORY_CARE_PROVIDER_SITE_OTHER): Payer: Self-pay | Admitting: Nurse Practitioner

## 2024-02-03 VITALS — BP 117/75 | HR 72 | Resp 18 | Ht 66.0 in | Wt 261.2 lb

## 2024-02-03 DIAGNOSIS — I5022 Chronic systolic (congestive) heart failure: Secondary | ICD-10-CM

## 2024-02-03 DIAGNOSIS — M7989 Other specified soft tissue disorders: Secondary | ICD-10-CM

## 2024-02-03 DIAGNOSIS — I825Z1 Chronic embolism and thrombosis of unspecified deep veins of right distal lower extremity: Secondary | ICD-10-CM

## 2024-02-03 DIAGNOSIS — R0602 Shortness of breath: Secondary | ICD-10-CM

## 2024-02-03 DIAGNOSIS — I2510 Atherosclerotic heart disease of native coronary artery without angina pectoris: Secondary | ICD-10-CM

## 2024-02-03 NOTE — Progress Notes (Signed)
 Subjective:    Patient ID: Shaun Anderson, male    DOB: 02/01/1961, 63 y.o.   MRN: [id number] Chief Complaint  Patient presents with   Establish Care    New Patient consult. HX DVT + BLE edema. Ref. Elodie    The patient presents today for evaluation of swelling and previous history of DVT.  He notes that he had his previous history of DVT in 2006.  Since that time he has always had swelling but it becomes worse during the day and it returns to normal at night.  However recently he has begun noting that the swelling is more consistent throughout the entire day that has not returned to its starting position.  He recently had a DVT study done which shows evidence of chronic thrombus in the right lower extremity.  The patient has previously worn medical grade compression stockings although not consistently as they are I am comfortable.  He notes that he was started on a diuretic and it has been somewhat helpful in the swelling.  He does have notable varicosities in the bilateral lower extremities.  He denies any open wounds or ulceration.    Review of Systems  Cardiovascular:  Positive for leg swelling.  All other systems reviewed and are negative.      Objective:   Physical Exam Vitals reviewed.  HENT:     Head: Normocephalic.  Cardiovascular:     Rate and Rhythm: Normal rate.  Pulmonary:     Effort: Pulmonary effort is normal.  Musculoskeletal:     Right lower leg: 2+ Edema present.  Skin:    General: Skin is warm and dry.  Neurological:     Mental Status: He is alert and oriented to person, place, and time.  Psychiatric:        Mood and Affect: Mood normal.        Behavior: Behavior normal.        Thought Content: Thought content normal.        Judgment: Judgment normal.     BP 117/75 (BP Location: Right Arm, Patient Position: Sitting, Cuff Size: Large)   Pulse 72   Resp 18   Ht 5' 6 (1.676 m)   Wt 261 lb 3.2 oz (118.5 kg)   BMI 42.16 kg/m   Past Medical  History:  Diagnosis Date   DVT (deep venous thrombosis) (HCC)    Hyperlipidemia    Hypertension    Sleep apnea    STEMI (ST elevation myocardial infarction) (HCC)    Stroke [facility])     Social History   Socioeconomic History   Marital status: Married    Spouse name: Not on file   Number of children: Not on file   Years of education: Not on file   Highest education level: Not on file  Occupational History   Not on file  Tobacco Use   Smoking status: Some Days    Current packs/day: 1.00    Average packs/day: 1 pack/day for 24.0 years (24.0 ttl pk-yrs)    Types: Cigarettes   Smokeless tobacco: Never  Vaping Use   Vaping status: Never Used  Substance and Sexual Activity   Alcohol use: Yes    Alcohol/week: 10.0 standard drinks of alcohol    Types: 10 Cans of beer per week   Drug use: Never   Sexual activity: Not on file  Other Topics Concern   Not on file  Social History Narrative   Not on file   Social  Drivers of Corporate investment banker Strain: Low Risk  (08/07/2023)   Received from [facility]   Overall Financial Resource Strain (CARDIA)    Difficulty of Paying Living Expenses: Not hard at all  Food Insecurity: No Food Insecurity (08/07/2023)   Received from [facility]   Hunger Vital Sign    Within the past 12 months, you worried that your food would run out before you got the money to buy more.: Never true    Within the past 12 months, the food you bought just didn't last and you didn't have money to get more.: Never true  Transportation Needs: No Transportation Needs (08/07/2023)   Received from [facility] - Transportation    In the past 12 months, has lack of transportation kept you from medical appointments or from getting medications?: No    Lack of Transportation (Non-Medical): No  Physical Activity: Not on file  Stress: Not on file  Social Connections: Not on file  Intimate Partner Violence:  Not on file    Past Surgical History:  Procedure Laterality Date   CARDIAC CATHETERIZATION Left 04/26/2016   Procedure: Left Heart Cath and Coronary Angiography;  Surgeon: Wolm JINNY Rhyme, MD;  Location: [facility] INVASIVE CV LAB;  Service: Cardiovascular;  Laterality: Left;   COLONOSCOPY WITH PROPOFOL  N/A 09/29/2018   Procedure: COLONOSCOPY WITH PROPOFOL ;  Surgeon: Viktoria Lamar DASEN, MD;  Location: [facility] ENDOSCOPY;  Service: Endoscopy;  Laterality: N/A;   COLONOSCOPY WITH PROPOFOL  N/A 09/09/2023   Procedure: COLONOSCOPY WITH PROPOFOL ;  Surgeon: Onita Elspeth Sharper, DO;  Location: [facility] ENDOSCOPY;  Service: Gastroenterology;  Laterality: N/A;   NO PAST SURGERIES     POLYPECTOMY  09/09/2023   Procedure: POLYPECTOMY;  Surgeon: Onita Elspeth Sharper, DO;  Location: [facility] ENDOSCOPY;  Service: Gastroenterology;;   ROBLEY LIFTING INJECTION  09/09/2023   Procedure: SUBMUCOSAL LIFTING INJECTION;  Surgeon: Onita Elspeth Sharper, DO;  Location: [facility] ENDOSCOPY;  Service: Gastroenterology;;   SUBMUCOSAL TATTOO INJECTION  09/09/2023   Procedure: SUBMUCOSAL TATTOO INJECTION;  Surgeon: Onita Elspeth Sharper, DO;  Location[facility] ENDOSCOPY;  Service: Gastroenterology;;    History reviewed. No pertinent family history.  Allergies  Allergen Reactions   Lisinopril Rash       Latest Ref Rng & Units 08/14/2022    3:52 AM 08/13/2022    4:43 AM 08/12/2022    4:44 PM  CBC  WBC 4.0 - 10.5 K/uL 8.9  9.0  9.5   Hemoglobin 13.0 - 17.0 g/dL 84.0  84.3  84.3   Hematocrit 39.0 - 52.0 % 49.2  48.3  46.7   Platelets 150 - 400 K/uL 396  417  428       CMP     Component Value Date/Time   NA 139 08/14/2022 0352   K 3.9 08/14/2022 0352   CL 107 08/14/2022 0352   CO2 26 08/14/2022 0352   GLUCOSE 96 08/14/2022 0352   BUN 12 08/14/2022 0352   CREATININE 0.80 08/14/2022 0352   CALCIUM  9.0 08/14/2022 0352   PROT 7.0 08/12/2022 1644   ALBUMIN 3.5 08/12/2022 1644   AST 21 08/12/2022 1644   ALT 22 08/12/2022 1644    ALKPHOS 76 08/12/2022 1644   BILITOT 0.8 08/12/2022 1644   GFRNONAA >60 08/14/2022 0352     No results found.     Assessment & Plan:   1. Leg swelling (Primary) The patient does have significant notable varicosities on his bilateral lower extremity.  He is certainly possible that he does have a component of venous insufficiency that is driving his worsening edema.  As noted below I have discussed conservative therapy tactics for him to engage in to try to help his swelling.  In addition to these tactics he may be a candidate for a lymphedema pump as well.  2. 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  I discussed postphlebitic symptoms and that the are typically chronic in nature and typically the best treatment is engaging in conservative therapy including use of medical grade compression, elevation and activity.  The patient will remain on Eliquis  as he is tolerated well and there are no issues for now.   Current Outpatient Medications on File Prior to Visit  Medication Sig Dispense Refill   aspirin  EC 81 MG tablet Take 81 mg by mouth daily as needed for mild pain, moderate pain or fever.     atorvastatin  (LIPITOR) 80 MG tablet Take 1 tablet (80 mg total) by mouth daily. 90 tablet 4   clopidogrel  (PLAVIX ) 75 MG tablet Take 75 mg by mouth daily.     ezetimibe  (ZETIA ) 10 MG tablet Take 1 tablet (10 mg total) by mouth daily. 30 tablet 2   losartan (COZAAR) 50 MG tablet Take 50 mg by mouth daily.     No current facility-administered medications on file prior to visit.    There are no Patient Instructions on file for this visit. No follow-ups on file.   Judaea Burgoon E Ellarose Brandi, NP

## 2024-02-11 ENCOUNTER — Other Ambulatory Visit: Payer: Self-pay | Admitting: Cardiology

## 2024-02-11 DIAGNOSIS — R0609 Other forms of dyspnea: Secondary | ICD-10-CM

## 2024-02-11 NOTE — Progress Notes (Signed)
 Orders only for Cardiac PET stress test.   Hamp Levine, PA-C

## 2024-02-13 ENCOUNTER — Ambulatory Visit: Admission: RE | Admit: 2024-02-13 | Source: Ambulatory Visit

## 2024-02-14 ENCOUNTER — Encounter (HOSPITAL_COMMUNITY): Payer: Self-pay

## 2024-02-20 ENCOUNTER — Ambulatory Visit
Admission: RE | Admit: 2024-02-20 | Discharge: 2024-02-20 | Disposition: A | Discharge status: Home / Self Care | Source: Ambulatory Visit | Attending: Nurse Practitioner | Admitting: Nurse Practitioner

## 2024-02-20 DIAGNOSIS — I700 Atherosclerosis of aorta: Secondary | ICD-10-CM | POA: Diagnosis not present

## 2024-02-20 DIAGNOSIS — K8020 Calculus of gallbladder without cholecystitis without obstruction: Secondary | ICD-10-CM | POA: Diagnosis not present

## 2024-02-20 DIAGNOSIS — I5022 Chronic systolic (congestive) heart failure: Secondary | ICD-10-CM | POA: Diagnosis not present

## 2024-02-20 DIAGNOSIS — I2510 Atherosclerotic heart disease of native coronary artery without angina pectoris: Secondary | ICD-10-CM | POA: Insufficient documentation

## 2024-02-20 DIAGNOSIS — R0602 Shortness of breath: Secondary | ICD-10-CM | POA: Diagnosis not present

## 2024-02-20 MED ORDER — REGADENOSON 0.4 MG/5ML IV SOLN
INTRAVENOUS | Status: AC
  Filled 2024-02-20: qty 5

## 2024-02-20 MED ORDER — REGADENOSON 0.4 MG/5ML IV SOLN
0.4000 mg | Freq: Once | INTRAVENOUS | Status: AC
  Administered 2024-02-20: 0.4 mg via INTRAVENOUS
  Filled 2024-02-20: qty 5

## 2024-02-20 MED ORDER — RUBIDIUM RB82 GENERATOR (RUBYFILL)
25.0000 | PACK | Freq: Once | INTRAVENOUS | Status: AC
  Administered 2024-02-20: 24.94 via INTRAVENOUS

## 2024-02-20 MED ORDER — RUBIDIUM RB82 GENERATOR (RUBYFILL)
25.0000 | PACK | Freq: Once | INTRAVENOUS | Status: AC
  Administered 2024-02-20: 24.93 via INTRAVENOUS

## 2024-02-21 LAB — NM PET CT CARDIAC PERFUSION MULTI W/ABSOLUTE BLOODFLOW
MBFR: 1.6
Nuc Rest EF: 38 %
Nuc Stress EF: 39 %
Peak HR: 68 {beats}/min
Rest HR: 62 {beats}/min
Rest MBF: 0.5 ml/g/min
Rest Nuclear Isotope Dose: 24.9 mCi
SRS: 4
SSS: 8
ST Depression (mm): 0 mm
Stress MBF: 0.8 ml/g/min
Stress Nuclear Isotope Dose: 24.9 mCi
TID: 1.01

## 2024-02-26 ENCOUNTER — Other Ambulatory Visit: Payer: Self-pay

## 2024-02-26 ENCOUNTER — Encounter: Admission: RE | Disposition: A | Discharge status: Home / Self Care | Payer: Self-pay | Source: Home / Self Care

## 2024-02-26 ENCOUNTER — Ambulatory Visit: Admission: RE | Admit: 2024-02-26 | Discharge: 2024-02-26 | Disposition: A | Discharge status: Home / Self Care

## 2024-02-26 DIAGNOSIS — R931 Abnormal findings on diagnostic imaging of heart and coronary circulation: Secondary | ICD-10-CM | POA: Diagnosis present

## 2024-02-26 DIAGNOSIS — R001 Bradycardia, unspecified: Secondary | ICD-10-CM | POA: Diagnosis not present

## 2024-02-26 DIAGNOSIS — I2510 Atherosclerotic heart disease of native coronary artery without angina pectoris: Secondary | ICD-10-CM | POA: Insufficient documentation

## 2024-02-26 HISTORY — PX: LEFT HEART CATH AND CORONARY ANGIOGRAPHY: CATH118249

## 2024-02-26 SURGERY — LEFT HEART CATH AND CORONARY ANGIOGRAPHY
Anesthesia: Moderate Sedation | Laterality: Left

## 2024-02-26 MED ORDER — HEPARIN (PORCINE) IN NACL 1000-0.9 UT/500ML-% IV SOLN
INTRAVENOUS | Status: AC
  Filled 2024-02-26: qty 1000

## 2024-02-26 MED ORDER — FENTANYL CITRATE (PF) 100 MCG/2ML IJ SOLN
INTRAMUSCULAR | Status: DC | PRN
  Administered 2024-02-26 (×2): 50 ug via INTRAVENOUS

## 2024-02-26 MED ORDER — LIDOCAINE HCL 1 % IJ SOLN
INTRAMUSCULAR | Status: AC
  Filled 2024-02-26: qty 20

## 2024-02-26 MED ORDER — VERAPAMIL HCL 2.5 MG/ML IV SOLN
INTRAVENOUS | Status: AC
  Filled 2024-02-26: qty 2

## 2024-02-26 MED ORDER — MIDAZOLAM HCL 2 MG/2ML IJ SOLN
INTRAMUSCULAR | Status: DC | PRN
  Administered 2024-02-26 (×2): 1 mg via INTRAVENOUS

## 2024-02-26 MED ORDER — HEPARIN (PORCINE) IN NACL 1000-0.9 UT/500ML-% IV SOLN
INTRAVENOUS | Status: DC | PRN
  Administered 2024-02-26: 1000 mL

## 2024-02-26 MED ORDER — IOHEXOL 300 MG/ML  SOLN
INTRAMUSCULAR | Status: DC | PRN
  Administered 2024-02-26: 55 mL

## 2024-02-26 MED ORDER — HEPARIN SODIUM (PORCINE) 1000 UNIT/ML IJ SOLN
INTRAMUSCULAR | Status: AC
  Filled 2024-02-26: qty 10

## 2024-02-26 MED ORDER — LIDOCAINE HCL (PF) 1 % IJ SOLN
INTRAMUSCULAR | Status: DC | PRN
  Administered 2024-02-26: 2 mL

## 2024-02-26 MED ORDER — MIDAZOLAM HCL 2 MG/2ML IJ SOLN
INTRAMUSCULAR | Status: AC
  Filled 2024-02-26: qty 2

## 2024-02-26 MED ORDER — ACETAMINOPHEN 325 MG PO TABS: 650.0000 mg | ORAL_TABLET | ORAL | Status: DC | PRN

## 2024-02-26 MED ORDER — FENTANYL CITRATE (PF) 100 MCG/2ML IJ SOLN
INTRAMUSCULAR | Status: AC
Start: 2024-02-26 — End: 2024-02-26
  Filled 2024-02-26: qty 2

## 2024-02-26 MED ORDER — VERAPAMIL HCL 2.5 MG/ML IV SOLN
INTRAVENOUS | Status: DC | PRN
  Administered 2024-02-26: 2.5 mg via INTRA_ARTERIAL

## 2024-02-26 MED ORDER — SODIUM CHLORIDE 0.9% FLUSH
3.0000 mL | INTRAVENOUS | Status: DC | PRN
Start: 2024-02-26 — End: 2024-02-26

## 2024-02-26 MED ORDER — SODIUM CHLORIDE 0.9 % IV SOLN: 250.0000 mL | INTRAVENOUS | Status: DC | PRN

## 2024-02-26 MED ORDER — FREE WATER
250.0000 mL | Freq: Once | Status: AC
  Administered 2024-02-26: 250 mL via ORAL

## 2024-02-26 MED ORDER — ONDANSETRON HCL 4 MG/2ML IJ SOLN: 4.0000 mg | Freq: Four times a day (QID) | INTRAMUSCULAR | Status: DC | PRN

## 2024-02-26 MED ORDER — HEPARIN SODIUM (PORCINE) 1000 UNIT/ML IJ SOLN
INTRAMUSCULAR | Status: DC | PRN
Start: 2024-02-26 — End: 2024-02-26
  Administered 2024-02-26: 5000 [IU] via INTRAVENOUS

## 2024-02-26 MED ORDER — SODIUM CHLORIDE 0.9% FLUSH: 3.0000 mL | Freq: Two times a day (BID) | INTRAVENOUS | Status: DC

## 2024-02-26 MED ORDER — ASPIRIN 81 MG PO CHEW: 81.0000 mg | CHEWABLE_TABLET | ORAL | Status: DC

## 2024-02-26 SURGICAL SUPPLY — 9 items
CATH INFINITI 5 FR JL3.5 (CATHETERS) IMPLANT
CATH INFINITI JR4 5F (CATHETERS) IMPLANT
DEVICE RAD TR BAND REGULAR (VASCULAR PRODUCTS) IMPLANT
DRAPE BRACHIAL (DRAPES) IMPLANT
GLIDESHEATH SLEND A-KIT 6F 22G (SHEATH) IMPLANT
GUIDEWIRE INQWIRE 1.5J.035X260 (WIRE) IMPLANT
PACK CARDIAC CATH (CUSTOM PROCEDURE TRAY) ×1 IMPLANT
SET ATX-X65L (MISCELLANEOUS) IMPLANT
STATION PROTECTION PRESSURIZED (MISCELLANEOUS) IMPLANT

## 2024-03-05 DIAGNOSIS — I890 Lymphedema, not elsewhere classified: Secondary | ICD-10-CM | POA: Insufficient documentation

## 2024-03-05 NOTE — Progress Notes (Deleted)
 MRN : [id number]  Shaun Anderson is a 63 y.o. ([birth year]) male who presents with chief complaint of legs swell.  History of Present Illness:   The patient returns to the office for followup evaluation regarding leg swelling.  The swelling has persisted and the pain associated with swelling continues. There have not been any interval development of a ulcerations or wounds.  He notes that he had his previous history of DVT in 2006. Since that time he has always had swelling but it becomes worse during the day and it returns to normal at night. However recently he has begun noting that the swelling is more consistent throughout the entire day that has not returned to its starting position. He recently had a DVT study done which shows evidence of chronic thrombus in the right lower extremity.   Since the previous visit the patient has been wearing graduated compression stockings and has noted little if any improvement in the lymphedema. The patient has been using compression routinely morning until night.  The patient also states elevation during the day and exercise is being done too.  No outpatient medications have been marked as taking for the 03/09/24 encounter (Appointment) with Jama, Cordella MATSU, MD.    Past Medical History:  Diagnosis Date   DVT (deep venous thrombosis) (HCC)    Hyperlipidemia    Hypertension    Sleep apnea    STEMI (ST elevation myocardial infarction) [facility])    Stroke [facility])     Past Surgical History:  Procedure Laterality Date   CARDIAC CATHETERIZATION Left 04/26/2016   Procedure: Left Heart Cath and Coronary Angiography;  Surgeon: Wolm JINNY Rhyme, MD;  Location: [facility] INVASIVE CV LAB;  Service: Cardiovascular;  Laterality: Left;   COLONOSCOPY WITH PROPOFOL N/A 09/29/2018   Procedure: COLONOSCOPY WITH PROPOFOL;  Surgeon: Viktoria Lamar DASEN, MD;  Location: [facility] ENDOSCOPY;  Service: Endoscopy;  Laterality: N/A;   COLONOSCOPY WITH  PROPOFOL N/A 09/09/2023   Procedure: COLONOSCOPY WITH PROPOFOL;  Surgeon: Onita Elspeth Sharper, DO;  Location: [facility] ENDOSCOPY;  Service: Gastroenterology;  Laterality: N/A;   LEFT HEART CATH AND CORONARY ANGIOGRAPHY Left 02/26/2024   Procedure: LEFT HEART CATH AND CORONARY ANGIOGRAPHY;  Surgeon: Katina Albright, MD;  Location: [facility] INVASIVE CV LAB;  Service: Cardiovascular;  Laterality: Left;   NO PAST SURGERIES     POLYPECTOMY  09/09/2023   Procedure: POLYPECTOMY;  Surgeon: Onita Elspeth Sharper, DO;  Location: [facility] ENDOSCOPY;  Service: Gastroenterology;;   ROBLEY LIFTING INJECTION  09/09/2023   Procedure: SUBMUCOSAL LIFTING INJECTION;  Surgeon: Onita Elspeth Sharper, DO;  Location: [facility] ENDOSCOPY;  Service: Gastroenterology;;   SUBMUCOSAL TATTOO INJECTION  09/09/2023   Procedure: SUBMUCOSAL TATTOO INJECTION;  Surgeon: Onita Elspeth Sharper, DO;  Location: [facility] ENDOSCOPY;  Service: Gastroenterology;;    Social History Social History   Tobacco Use   Smoking status: Former    Current packs/day: 1.00    Average packs/day: 1 pack/day for 24.0 years (24.0 ttl pk-yrs)    Types: Cigarettes   Smokeless tobacco: Never  Vaping Use   Vaping status: Never Used  Substance Use Topics   Alcohol use: Yes    Alcohol/week: 10.0 standard drinks of alcohol    Types: 10 Cans of beer per week   Drug use: Never    Family History No family history on file.  Allergies  Allergen Reactions   Lisinopril Rash     REVIEW OF SYSTEMS (Negative unless checked)  Constitutional: [] Weight loss  [] Fever  [] Chills Cardiac: [] Chest pain   [] Chest pressure   [] Palpitations   [] Shortness of breath when laying flat   [] Shortness of breath with exertion. Vascular:  [] Pain in legs with walking   [x] Pain in legs with standing  [] History of DVT   [] Phlebitis   [x] Swelling in legs   [] Varicose veins   [] Non-healing ulcers Pulmonary:   [] Uses home oxygen   [] Productive cough   [] Hemoptysis   [] Wheeze  [] COPD    [] Asthma Neurologic:  [] Dizziness   [] Seizures   [] History of stroke   [] History of TIA  [] Aphasia   [] Vissual changes   [] Weakness or numbness in arm   [] Weakness or numbness in leg Musculoskeletal:   [] Joint swelling   [] Joint pain   [] Low back pain Hematologic:  [] Easy bruising  [] Easy bleeding   [] Hypercoagulable state   [] Anemic Gastrointestinal:  [] Diarrhea   [] Vomiting  [] Gastroesophageal reflux/heartburn   [] Difficulty swallowing. Genitourinary:  [] Chronic kidney disease   [] Difficult urination  [] Frequent urination   [] Blood in urine Skin:  [] Rashes   [] Ulcers  Psychological:  [] History of anxiety   []  History of major depression.  Physical Examination  There were no vitals filed for this visit. There is no height or weight on file to calculate BMI. Gen: WD/WN, NAD Head: /AT, No temporalis wasting.  Ear/Nose/Throat: Hearing grossly intact, nares w/o erythema or drainage, pinna without lesions Eyes: PER, EOMI, sclera nonicteric.  Neck: Supple, no gross masses.  No JVD.  Pulmonary:  Good air movement, no audible wheezing, no use of accessory muscles.  Cardiac: RRR, precordium not hyperdynamic. Vascular:  scattered varicosities present bilaterally.  Mild venous stasis changes to the legs bilaterally.  3-4+ soft pitting edema, CEAP [id number]  Vessel Right Left  Radial Palpable Palpable  Gastrointestinal: soft, non-distended. No guarding/no peritoneal signs.  Musculoskeletal: M/S 5/5 throughout.  No deformity.  Neurologic: CN 2-12 intact. Pain and light touch intact in extremities.  Symmetrical.  Speech is fluent. Motor exam as listed above. Psychiatric: Judgment intact, Mood & affect appropriate for pt's clinical situation. Dermatologic: Venous rashes no ulcers noted.  No changes consistent with cellulitis. Lymph : No lichenification or skin changes of chronic lymphedema.  CBC Lab Results  Component Value Date   WBC 8.9 08/14/2022   HGB 15.9 08/14/2022   HCT 49.2 08/14/2022    MCV 89.9 08/14/2022   PLT 396 08/14/2022    BMET    Component Value Date/Time   NA 139 08/14/2022 0352   K 3.9 08/14/2022 0352   CL 107 08/14/2022 0352   CO2 26 08/14/2022 0352   GLUCOSE 96 08/14/2022 0352   BUN 12 08/14/2022 0352   CREATININE 0.80 08/14/2022 0352   CALCIUM 9.0 08/14/2022 0352   GFRNONAA >60 08/14/2022 0352   GFRAA >60 11/30/2017 0907   CrCl cannot be calculated (Patient's most recent lab result is older than the maximum 21 days allowed.).  COAG Lab Results  Component Value Date   INR 1.0 08/12/2022   INR 3.0 10/18/2011   INR 1.7 09/21/2011    Radiology CARDIAC CATHETERIZATION Result Date: 02/26/2024   Ost Ramus to Ramus lesion is 50% stenosed.   Prox RCA to Mid RCA lesion is 85% stenosed.   Ost 2nd Mrg to 2nd Mrg lesion is 50% stenosed.   Ost LM to Mid LM lesion is 70% stenosed.   Prox  Cx to Dist Cx lesion is 20% stenosed.   Prox LAD to Mid LAD lesion is 20% stenosed.   Mid LAD lesion is 99% stenosed. 1.  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  Best served by bypass surgery to the left system 3.  Optimize medical therapy   NM PET CT CARDIAC PERFUSION MULTI W/ABSOLUTE BLOODFLOW Result Date: 02/21/2024   LV perfusion is abnormal. There is evidence of ischemia. There is evidence of infarction.   There is medium to large size, moderate intensity, predominantly reversible defect noted in mid anterior wall and entire apical wall suggestive of ischemia in mid to distal LAD territory, summed difference score 8.   Rest left ventricular function is abnormal. Rest EF: 38%. Stress left ventricular function is abnormal. Stress EF: 39%.   Myocardial blood flow was computed to be 0.50ml/g/min at rest and 0.80ml/g/min at stress which is reduced and can suggest multivessel obstructive CAD. Global myocardial blood flow reserve was 1.60 and was mildly abnormal.   Coronary calcium was present on the attenuation correction CT images. Severe coronary  calcifications were present. Coronary calcifications were present in the left anterior descending artery, left circumflex artery and right coronary artery distribution(s).   Findings are consistent with ischemia. The study is high risk.   Consider invasive coronary angiography as clinically indicated. EXAM: OVER-READ INTERPRETATION CARDIAC CT CHEST The following report is an over-read performed by radiologist Dr. Ryan Salvage of [facility] on 02/20/2024. This over-read does not include interpretation of cardiac or coronary anatomy or pathology. The cardiac and PET interpretation by the cardiologist is attached or will be attached. COMPARISON:  CT chest 07/01/2023 FINDINGS: Extracardiac Vascular: Mild aortic atherosclerosis. Mediastinum: Unremarkable Lung: Breathing motion artifact.  Otherwise unremarkable. Included Upper Abdomen: Cholelithiasis. Musculoskeletal: Unremarkable IMPRESSION: 1. Cholelithiasis. 2.  Aortic Atherosclerosis (ICD10-I70.0). Electronically Signed   By: Ryan Salvage M.D.   On: 02/20/2024 16:22    Assessment/Plan There are no diagnoses linked to this encounter.   Cordella Shawl, MD  03/05/2024 11:25 AM

## 2024-03-06 ENCOUNTER — Other Ambulatory Visit (INDEPENDENT_AMBULATORY_CARE_PROVIDER_SITE_OTHER): Payer: Self-pay | Admitting: Nurse Practitioner

## 2024-03-06 DIAGNOSIS — M7989 Other specified soft tissue disorders: Secondary | ICD-10-CM

## 2024-03-06 DIAGNOSIS — I825Z1 Chronic embolism and thrombosis of unspecified deep veins of right distal lower extremity: Secondary | ICD-10-CM

## 2024-03-09 ENCOUNTER — Encounter (INDEPENDENT_AMBULATORY_CARE_PROVIDER_SITE_OTHER)

## 2024-03-09 ENCOUNTER — Ambulatory Visit (INDEPENDENT_AMBULATORY_CARE_PROVIDER_SITE_OTHER): Admitting: Vascular Surgery

## 2024-03-09 DIAGNOSIS — I872 Venous insufficiency (chronic) (peripheral): Secondary | ICD-10-CM

## 2024-03-09 DIAGNOSIS — J449 Chronic obstructive pulmonary disease, unspecified: Secondary | ICD-10-CM

## 2024-03-09 DIAGNOSIS — I890 Lymphedema, not elsewhere classified: Secondary | ICD-10-CM

## 2024-03-09 DIAGNOSIS — E785 Hyperlipidemia, unspecified: Secondary | ICD-10-CM

## 2024-03-17 ENCOUNTER — Ambulatory Visit

## 2024-03-19 DIAGNOSIS — J449 Chronic obstructive pulmonary disease, unspecified: Secondary | ICD-10-CM | POA: Insufficient documentation

## 2024-03-22 DIAGNOSIS — I4891 Unspecified atrial fibrillation: Secondary | ICD-10-CM | POA: Insufficient documentation

## 2024-04-15 ENCOUNTER — Other Ambulatory Visit: Payer: Self-pay | Admitting: Nurse Practitioner

## 2024-04-15 ENCOUNTER — Ambulatory Visit
Admission: RE | Admit: 2024-04-15 | Discharge: 2024-04-15 | Disposition: A | Discharge status: Home / Self Care | Source: Ambulatory Visit | Attending: Nurse Practitioner | Admitting: Nurse Practitioner

## 2024-04-15 DIAGNOSIS — I4891 Unspecified atrial fibrillation: Secondary | ICD-10-CM | POA: Insufficient documentation

## 2024-04-15 DIAGNOSIS — R229 Localized swelling, mass and lump, unspecified: Secondary | ICD-10-CM | POA: Diagnosis present

## 2024-04-15 DIAGNOSIS — I2510 Atherosclerotic heart disease of native coronary artery without angina pectoris: Secondary | ICD-10-CM | POA: Insufficient documentation

## 2024-04-15 MED ORDER — IOHEXOL 350 MG/ML SOLN
75.0000 mL | Freq: Once | INTRAVENOUS | Status: AC | PRN
  Administered 2024-04-15: 75 mL via INTRAVENOUS

## 2024-04-15 NOTE — Progress Notes (Signed)
 Established Patient Visit   Chief Complaint: Chief Complaint  Patient presents with  . Follow-up    1 week follow up    Date of Service: 04/15/2024 Date of Birth: 1961/01/18 PCP: Sadie Tamra Cal, MD [address] [facility] [place] [address]  History of Present Illness:   Mr. Shaun Anderson is a 63 y.o.male patient that presents for f/u.  Presents for f/u r/t: H/P left MCA stroke 08/12/22 - aspirin , atorvastatin , zetia . Following with Neurology.   MRI of the brain with acute infarct in the left parieto-occipital cortex  CT angio head and neck which had overall showed no significant stenosis within the carotid system  Echocardiogram showed normal LV systolic function with EF 55-60%, no significant valvular abnormalities, negative PFO  Holter monitor was placed for 14 days after admission for evidence of arrhythmias that may have contributed to patient's stroke. Showed predominant sinus rhythm with mean heart rate of 71 bpm, sinus heart rate range 37 to 125 bpm. Occasional premature ventricular contractions and infrequent premature atrial contractions were observed. There were 4 atrial runs the longest lasting 17 beats. There were no sustained episodes of atrial fibrillation. There were no diary entries.  HLD - taking atorvastatin  80 mg qd and zetia  10 mg qd- last LDL 57 mg/dL 02/20/73 HTN CAD s/p PCI and stent placement of distal Lcx and mid LAD to diagonal 04/26/2016; s/p CABG x 2v 03/17/24 at [facility] (LIMA-LAD, R radial-OM1)  aspirin , atorvastatin , zetia  for secondary ASCVD management.  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 Loaded with oral amiodarone and cardioverted on 8/30. Rhythm at the time of discharge was normal sinus rhythm. With recurrence after stopping amiodarone.  CHA2DS2-VASc Score: 4 (1-HF, 1-HTN, 2-CVA hx)  Seen 01/13/24 reporting onset of BLE edema, R>L in the prior 3-4 weeks. BLE Doppler US  01/13/24 revealed chronic right DVT right superficial femoral vein and popliteal veins which is nonocclusive and similar to prior study. No DVT of LLE. Referred to Vascular Surgery for management. 2D ECHO 01/13/24 revealed mild LVH with mild systolic dysfunction, EF 45-50%, with inferior lateral hypokinesis. G2DD. Normal RV systolic function. Trivial valvular regurgitation. No valvular stenosis. RVSP 34 mmHg. Patient seen for follow up 02/03/24 reporting DOE only when walking outside in the heat. He was notably tachypneic at rest. His wife reports tachypnea at rest is new. He denies orthopnea. Reports some acute weight gain intermittently. He continues off cigarettes. Denies chest pain or indigestion. PET CT Stress test ordered for further evaluation - completed 02/20/24 and showed abnormal LV perfusion with evidence of ischemia and infarction. Medium to large size, moderate intensity predominantly reversible defect noted in mid anterior wall and entire apical wall suggestive of ischemia in mid to distal LAD territory. Resting EF 38%, stres EF 39%. Myocardial blood flow 0.50 mL/g/min  at rest and 0.80 mL/g/min at stress, which is reduced and can suggest multivessal obstructive CAD. Global myocardial reserve was 1.60 and mildly abnormal. Severe coronary calcifications appreciated in LAD, LCX, and RCA. Referred for  LHC done by Dr. Katina 02/26/24 which revealed severe 3v CAD - left main, LAD (in-stent) and Lcx - and a left dominant system. Referred to [facility] for CABG.   Admitted at [facility] 8/25-03/25/24 for CABG x2 (LIMA-LAD, R radial-OM1) completed on 03/17/24 by Dr. Vekstein. Severe 3v disease by LHC. Given limited conduit, plan will be LIMA and RIGHT radial harvest. Plan is to consider PCI to ramus and PL system if needed down the line, with Dr. Katina. TEE in the OR showed EF 35-40% pre-op. Patient with hypoxemia post-operatively. He was reintubated on 8/27 and RHC was completed that was also negative for PE. Patient was then placed on VV ECMO on 8/28.  Patient treated for presumed COPD exacerbation with steroids.  Proteus mirabilis pneumonia treated with 7 day course of antibiotics. Developed post op atrial fibrillation, loaded with oral amiodarone and cardioverted on 8/30. Rhythm at the time of discharge was normal sinus rhythm. Discharged on amiodarone 200 mg qd. Losartan and spironolactone held due to hypotension at discharge. Pt required inotropic support at one point during admission.   Seen 04/06/24 for hospital follow up. Overall doing well. Reported overnight wt gain from baseline 246 lbs to 249 lbs today. Taking furosemide 40 mg QD - advised taking extra dose that day and then as needed for acute weight gain of 2-3 lbs. Reported onset of bilateral localized swelling of shoulders along clavicle. He reports this has occurred previously, about a year ago with no clear etiology and spontaneous resolution. He denies pain, dyspnea or dysphagia. No neck or throat swelling. Plan to monitor. He followed up with [facility] on 9/18 with staples removed and noted to be in atrial fibrillation on amiodarone 200 mg qd. Amiodarone increased to 400 mg qd. Referred back to us  for anticoagulation management.   He presents today for follow up. Denies palpitations. He reports he has not needed to take any extra Lasix doses. Targeting baseline  wt 247-248 lbs. Denies worsening leg swelling. He has chronic BLE edema, R>L with Doppler US  01/13/24 revealing chronic right DVT right superficial femoral vein and popliteal veins which is nonocclusive and similar to prior study. Has been referred to Vascular Surgery. DOE at baseline and stable. Denies chest pain. Chest incision healing well without erythema or discharge. No fever. Denies dizziness or presyncope. He has not started cardiac rehab but has plans to call to initiate today. Recently on Zpak and Prednisone by Pulmonology for COPD exacerbation. Last dose today. EKG today reveals atrial fibrillation with variable AV block, HR 98 bpm, QTC 492 ms. Review of prior EKGs on amiodarone 200 mg qd dosing reveal QTC 454 ms. He continues to have bilateral localized swelling of shoulders along clavicle. He reports this has occurred previously, about a year ago with no clear etiology and spontaneous resolution. Recent onset following recent CABG. He denies pain, dyspnea or dysphagia. No neck or throat swelling.   Past Medical and Surgical History  Past Medical History Past Medical History:  Diagnosis Date  . Acute respiratory failure requiring intubation 03/18/2024  . Angina decubitus () 04/26/2016  . Arthritis   . Benign essential HTN 08/06/2016  . Congenital heart disease (HHS-HCC)   . Coronary artery disease 2017  . Former tobacco use 03/17/2024  . History of DVT (deep vein thrombosis)  04/19/2016  . History of stroke 08/12/2022  . History of substance use 03/17/2024   Previous cocaine use   . MI (myocardial infarction) (CMS/HHS-HCC)   . Mixed hyperlipidemia 04/25/2016  . Polycythemia 04/19/2016  . Sleep apnea   . Tobacco user 04/19/2016  . Vitamin D deficiency 03/22/2024    Past Surgical History He has a past surgical history that includes Transcath Placement Intravascular Stent Chest (05/2016); Colonoscopy (09/29/2018); Colon @ [facility] (09/09/2023); Coronary angioplasty (2017); coronary  artery bypass w/single artery graft (N/A, 03/17/2024); endoscopic harvest uxtr artery 1 segment cab px  (Right, 03/17/2024); insertion cannulas for extracorporeal circulation (Bilateral, 03/18/2024); and other surgery (03/17/2024).   Medications and Allergies  Current Medications  Current Outpatient Medications on File Prior to Visit  Medication Sig Dispense Refill  . allopurinoL (ZYLOPRIM) 100 MG tablet Take 1 tablet by mouth once daily 90 tablet 0  . amLODIPine (NORVASC) 2.5 MG tablet Take 1 tablet (2.5 mg total) by mouth once daily 30 tablet 1  . aspirin  81 MG EC tablet Take 81 mg by mouth once daily    . atorvastatin  (LIPITOR) 80 MG tablet Take 1 tablet by mouth once daily 90 tablet 0  . cholecalciferol (VITAMIN D3) 2,000 unit capsule Take 2,000 Units by mouth once daily    . empagliflozin (JARDIANCE) 10 mg tablet Take 1 tablet (10 mg total) by mouth once daily 30 tablet 6  . ezetimibe  (ZETIA ) 10 mg tablet Take 1 tablet by mouth once daily 30 tablet 0  . fluticasone propion-salmeteroL (ADVAIR DISKUS) 250-50 mcg/dose diskus inhaler Inhale 1 Puff into the lungs every 12 (twelve) hours 60 each 3  . fluticasone propionate (FLONASE) 50 mcg/actuation nasal spray Place 1 spray into both nostrils once daily 16 g 11  . FUROsemide (LASIX) 40 MG tablet Take 1 tablet (40 mg total) by mouth 2 (two) times daily for 7 days, THEN 1 tablet (40 mg total) once daily for 23 days. 37 tablet 0  . ipratropium (ATROVENT) 21 mcg (0.03 %) nasal spray Place 1 spray into both nostrils 2 (two) times daily as needed for Rhinitis 30 mL 11  . polyethylene glycol (MIRALAX) packet Take 1 packet (17 g total) by mouth 2 (two) times daily Mix in 4-8ounces of fluid prior to taking. 60 packet 0  . potassium chloride (KLOR-CON M20) 20 MEQ ER tablet Take 2 tablets (40 mEq total) by mouth 2 (two) times daily with meals for 7 days, THEN 2 tablets (40 mEq total) once daily for 23 days. 74 tablet 0  . predniSONE (DELTASONE) 20 MG tablet  Take 1 tablet (20 mg total) by mouth every morning 5 tablet 0  . sennosides-docusate (SENOKOT-S) 8.6-50 mg tablet Take 2 tablets by mouth 2 (two) times daily 120 tablet 0   No current facility-administered medications on file prior to visit.    Allergies: Lisinopril  Social and Family History  Social History  reports that he quit smoking about 5 months ago. His smoking use included cigarettes. He started smoking about 39 years ago. He has a 39.2 pack-year smoking history. He has been exposed to tobacco smoke. He has never used smokeless tobacco. He reports that he does not currently use alcohol after a past usage of about 16.0 - 24.0 standard drinks of alcohol per week. He reports that he does not use drugs.  Family History Family History  Problem Relation Name Age of Onset  . Coronary Artery Disease (Blocked arteries around heart) Mother Loman Logan 49  . Atrial  fibrillation (Abnormal heart rhythm sometimes requiring treatment with blood thinners) Mother Teddie Mehta   . Heart failure Father    . Cancer Brother    . Cancer Brother    . Heart failure Paternal Uncle      Review of Systems   Review of Systems  Constitutional:        Weight gain as per HPI  Respiratory:  Negative for shortness of breath.   Cardiovascular:  Positive for orthopnea. Negative for chest pain, palpitations and leg swelling.  Gastrointestinal:  Negative for heartburn.  Neurological:  Negative for dizziness.  Endo/Heme/Allergies:  Does not bruise/bleed easily.      Physical Examination   Vitals:BP 122/80   Pulse 98   Ht 170.2 cm (5' 7)   Wt (!) 113.6 kg (250 lb 6.4 oz)   SpO2 95%   BMI 39.22 kg/m  Ht:170.2 cm (5' 7) Wt:(!) 113.6 kg (250 lb 6.4 oz) ADJ:Anib surface area is 2.32 meters squared. Body mass index is 39.22 kg/m.  Physical Exam Vitals reviewed.  Constitutional:      General: He is not in acute distress.    Appearance: Normal appearance.  Cardiovascular:     Rate and Rhythm:  Normal rate and regular rhythm.     Heart sounds: Normal heart sounds. No murmur heard. Pulmonary:     Effort: Pulmonary effort is normal. No tachypnea or respiratory distress.     Breath sounds: Normal breath sounds. No decreased breath sounds or rales.  Musculoskeletal:     Right lower leg: 1+ Edema present.     Left lower leg: 1+ Edema present.     Comments: Varicose veins appreciated bilaterally; no pitting edema  Skin:     Neurological:     Mental Status: He is alert.     Data & Results   Recent Labs    01/23/23 1256 07/31/23 0758 02/04/24 0819  CHOLTOTAL 142 136 138  HDL 56.8 50.7 54.4  LDLCALC 60 57 56  VLDL 25 28 28   TRIG 124 141 139    Recent Labs    02/04/24 0819 02/24/24 0921 03/16/24 1642 03/17/24 1513 03/21/24 1940 03/22/24 0220 03/24/24 0512 03/25/24 0615 04/09/24 1000  NA 145   < > 141   < >  --    < > 136 137 140  K 4.3   < > 4.2   < >  --    < > 3.7 3.7 3.7  BUN 18   < > 16   < >  --    < > 38* 32* 16  CREATININE 1.1   < > 0.9   < >  --    < > 1.1 1.3 1.2  CO2 34.6*   < > 26   < >  --    < > 21 22 28   GLUCOSE 70   < > 105   < >  --    < > 102 98 102  ALT 34  --  27  --  23  --   --   --   --   AST 22  --  20  --  27  --   --   --   --   TBILI 0.5  --  0.7  --  1.0  --   --   --   --   ALB 4.1  --  3.3*  --  2.8*  --   --   --   --    < > =  values in this interval not displayed.    Recent Labs    03/24/24 0512 03/25/24 0615 04/09/24 1000  WBC 22.7* 20.5* 8.8  HGB 11.6* 11.0* 10.7*  HCT 35.5* 34.4* 34.6*  MCV 92 95 97  PLT 306 325 455*    Recent Labs    01/23/23 1256 07/31/23 0758 02/04/24 0819 03/16/24 1642 03/17/24 1338 03/20/24 0306 03/21/24 0128 03/22/24 0220  INR  --   --   --  0.9   < > 1.0 1.0 1.0  TSH 2.097  --  1.846  --   --   --   --   --   HGBA1C 6.0* 5.9* 5.9* 6.0*  --   --   --   --    < > = values in this interval not displayed.      ECHO 08/13/22 1. Left ventricular ejection fraction, by estimation, is 55  to 60%. The left ventricle has normal function. The left ventricle has no regional wall motion abnormalities. The left ventricular internal cavity size was mildly dilated. Left ventricular  diastolic parameters were normal.   2. Right ventricular systolic function is normal. The right ventricular size is normal. Tricuspid regurgitation signal is inadequate for assessing PA pressure.   3. Left atrial size was mildly dilated.   4. The mitral valve is normal in structure. No evidence of mitral valve regurgitation. No evidence of mitral stenosis.   5. The aortic valve is normal in structure. Aortic valve regurgitation is not visualized. No aortic stenosis is present.   6. The inferior vena cava is normal in size with greater than 50% respiratory variability, suggesting right atrial pressure of 3 mmHg.   72-hour Holter monitor 07/18/2023 - 07/21/2023 revealed  1.  Predominant sinus rhythm with mean heart rate 73 bpm, range 30-128 bpm 2.  Sinus bradycardia observed, minimum heart rate 30 bpm (07/21/23 08:05:09 - pt asleep). 16% bradycardia, 75% controlled HR, 9% tachycardia. 3.  Occasional premature atrial contractions 4.  Occasional premature ventricular contractions 5.  2 runs of SVT, longest lasting 10 beats 6.  No correlation with diary entries  Assessment   63 y.o. male with  Encounter Diagnoses  Name Primary?  . Atrial fibrillation, unspecified type (CMS/HHS-HCC) Yes  . Localized soft tissue swelling   . Tobacco user   . History of DVT (deep vein thrombosis)   . Mixed hyperlipidemia   . CAD   . Benign essential HTN   . OSA (obstructive sleep apnea)   . Coronary artery disease involving native coronary artery of native heart without angina pectoris   . Ascending aorta dilatation ()   . Cerebrovascular accident (CVA), unspecified mechanism (CMS/HHS-HCC)   . Varicose veins of both legs with edema   . History of substance use   . Former tobacco use   . S/P CABG (coronary artery bypass  graft)     Plan   Orders Placed This Encounter  Procedures  . CT chest angiogram with and without contrast  . ECG 12-lead   - HFmrEF with EF 45-50% (prior EF 55-60% on 08/13/22 ECHO; EF 40% on 05/26/21 ECHO). S/P LHC 02/26/24 which revealed severe 3v CAD - left main, LAD (in-stent) and LCx - and a left dominant system. S/P CABG x2 (LIMA-LAD, R radial-OM1) completed on 03/17/24 by Dr. Vekstein [facility]. Severe 3v disease by LHC. Given limited conduit, plan will be LIMA and RIGHT radial harvest. Plan is to consider PCI to ramus and PL system if needed down  the line, with Dr. Katina. Preop TEE revealed EF 35-40%.  - Continue CAD medical management with aspirin , atorvastatin , zetia , amlodipine, metoprolol  tartrate  - Referred cardiac rehab last visit. Pt to call to initiate today.   - The patient was instructed to not have any routine dental cleaning and/or invasive procedures for 3 months following surgery.   - HFmrEF GDMT:   - Continue jardiance 10 mg daily, furosemide 40 mg qd, and metoprolol  tartrate with rate 25 mg BID. Consider switching to succinate for better GDMT optimization at follow up. Goal for rate control for AF today.    - Recommended taking extra dose of furosemide 40 mg in addition to daily dose for weight 2-3 lbs above baseline weight of 247-248 lbs. Pt should call our office if weight not at baseline after 2 days of extra furosemide.  - Previously on spironolactone 25 mg daily, which was stopped during hospitalization due to hypotension. Consider resuming MRA at follow up, if BP will tolerate.  - Losartan 25 mg daily stopped 2/2 hypotension during recent admission. Consider resuming at follow up, if BP will tolerate. Avoid Entresto due to ACEi allergy.    - Encourage low salt diet   - Encourage hydration with 2 L water  daily   - Encourage daily weights. Notify me of acute weight gain 2-3 lbs overnight or 5 lbs in 1 week.    - Repeat ECHO in after sinus rhythm restored.   - Atrial  fibrillation recurrent after CABG. CHA2DS2-VASc Score 4. Amiodarone increased from 200 mg to 400 mg qd at 04/09/24 visit with CTS. EKG today reveals atrial fibrillation with variable AV block, HR 98 bpm, QTC prolonged 492 ms (previously 454 ms on 200 mg qd dosing). Recently on azithromycin, combo may have contributed to QTC prolongation. Will decrease amiodarone back to 200 mg qd. Start Eliquis  5 mg BID. Increase metoprolol  tartrate from 12.5 mg BID to 25 mg BID. Follow up in 3 weeks. Plan for TEE/DCCV if pt remains in AF after 3 weeks of anticoagulation.  Amiodarone baseline monitoring:  - TSH WNL 02/04/24  - LFTs WNL 02/04/24   - CXR baseline 04/09/24   - Chronic DVT of right superficial femoral vein and popliteal veins which is nonocclusive and similar in distribution when compared to previous study on recent US . No DVT involving LLE. Following with Vascular Surgery for management. On aspirin , atorvastatin  and zetia  with CAD and h/o CVA.   - Bradycardia noted on EKG at prior visit. 72-hr Holter showed predominant sinus rhythm with 16% bradycardia. Min HR 30 bpm while sleeping. 75% controlled rate. 9% sinus tachycardia. Pt asymptomatic. HR 98 bpm in atrial fibrillation. Increase metoprolol  tartrate dose as above.  - Ascending aorta 3.6 cm by ECHO 2022. Aortic root 3.7 cm by 08/13/22 ECHO. Stable on repeat ECHO - ascending aorta by ECHO 2025 - 3.6 cm; aortic root 3.70 cm by 01/13/24 ECHO. Surveillance q3 yrs, sooner if symptoms dictate.   - Continue CPAP. Pt not tolerating s/p CABG.   - Continue with tobacco cessation  - Consider adding GLP1-RA for weight loss with BMI 40   - Localized swelling at site of bilateral clavicles without erythema or crepitus. No neck swelling or airway obstruction. Good air movement in bilateral lung apices. H/O left apical PTX during recent hospitalization. SPO2 95% on RA today without dyspnea. Site of prior IJ central line without erythema or localized swelling. Pt reports  h/o same localized swelling 1 year prior with spontaneous resolution and unclear  etiology. With persistent swelling, evaluate vascular etiolgoy further with CTA Chest.  Return in about 3 weeks (around 05/06/2024).   MARY LAUREN CHEEK PARRISH, NP   Attestation Statement:   I personally performed the service, non-incident to. [facility])   MARY TINNIE LAUNIE MAIDEN, NP

## 2024-04-23 ENCOUNTER — Encounter: Attending: Internal Medicine | Admitting: Emergency Medicine

## 2024-04-23 ENCOUNTER — Encounter: Payer: Self-pay | Admitting: Emergency Medicine

## 2024-04-23 DIAGNOSIS — Z951 Presence of aortocoronary bypass graft: Secondary | ICD-10-CM | POA: Insufficient documentation

## 2024-04-23 NOTE — Progress Notes (Signed)
 Initial phone call completed. Diagnosis can be found in [facility] 03/16/2024. EP Orientation scheduled for Tuesday 10/7 at 8:00.

## 2024-04-28 ENCOUNTER — Encounter

## 2024-04-28 VITALS — Ht 68.0 in | Wt 254.5 lb

## 2024-04-28 DIAGNOSIS — Z951 Presence of aortocoronary bypass graft: Secondary | ICD-10-CM | POA: Diagnosis present

## 2024-04-28 NOTE — Progress Notes (Signed)
 Cardiac Individual Treatment Plan  Patient Details  Name: Shaun Anderson MRN: [id number] Date of Birth: 01/06/1961 Referring Provider:   Flowsheet Row Cardiac Rehab from 04/28/2024 in [facility] : [facility] Cardiac and Pulmonary Rehab  Referring Provider Dr. Cara Lovelace, MD    Initial Encounter Date:  Flowsheet Row Cardiac Rehab from 04/28/2024 in [facility] Cardiac and Pulmonary Rehab  Date 04/28/24    Visit Diagnosis: S/P CABG (coronary artery bypass graft)  Patient's Home Medications on Admission:  Current Outpatient Medications:    albuterol (ACCUNEB) 0.63 MG/3ML nebulizer solution, Take 1 ampule by nebulization every 6 (six) hours as needed for wheezing., Disp: , Rfl:    allopurinol (ZYLOPRIM) 100 MG tablet, Take 100 mg by mouth daily., Disp: , Rfl:    amiodarone (PACERONE) 200 MG tablet, Take 200 mg by mouth., Disp: , Rfl:    amLODipine (NORVASC) 2.5 MG tablet, Take 2.5 mg by mouth., Disp: , Rfl:    apixaban  (ELIQUIS ) 5 MG TABS tablet, Take 5 mg by mouth., Disp: , Rfl:    aspirin  EC 81 MG tablet, Take 81 mg by mouth daily as needed for mild pain, moderate pain or fever., Disp: , Rfl:    atorvastatin  (LIPITOR) 80 MG tablet, Take 1 tablet (80 mg total) by mouth daily., Disp: 90 tablet, Rfl: 4   atorvastatin  (LIPITOR) 80 MG tablet, Take 80 mg by mouth daily., Disp: , Rfl:    Cholecalciferol 50 MCG (2000 UT) CAPS, Take 2,000 Units by mouth., Disp: , Rfl:    clopidogrel  (PLAVIX ) 75 MG tablet, Take 75 mg by mouth daily., Disp: , Rfl:    empagliflozin (JARDIANCE) 10 MG TABS tablet, Take 10 mg by mouth daily., Disp: , Rfl:    ezetimibe  (ZETIA ) 10 MG tablet, Take 1 tablet (10 mg total) by mouth daily., Disp: 30 tablet, Rfl: 2   fluticasone (FLONASE) 50 MCG/ACT nasal spray, Place 1 spray into the nose., Disp: , Rfl:    fluticasone-salmeterol (ADVAIR) 100-50 MCG/ACT AEPB, Inhale into the lungs., Disp: , Rfl:    furosemide (LASIX) 40 MG tablet, Take by mouth., Disp: , Rfl:    ipratropium (ATROVENT) 0.03 %  nasal spray, Place 1 spray into the nose., Disp: , Rfl:    losartan (COZAAR) 50 MG tablet, Take 50 mg by mouth daily., Disp: , Rfl:    polyethylene glycol (MIRALAX / GLYCOLAX) 17 g packet, Take 17 g by mouth., Disp: , Rfl:    potassium chloride SA (KLOR-CON M) 20 MEQ tablet, Take by mouth., Disp: , Rfl:    predniSONE (DELTASONE) 20 MG tablet, Take 20 mg by mouth., Disp: , Rfl:    senna-docusate (SENOKOT-S) 8.6-50 MG tablet, Take 2 tablets by mouth., Disp: , Rfl:   Past Medical History: Past Medical History:  Diagnosis Date   A-fib (HCC)    CAD (coronary artery disease)    DVT (deep venous thrombosis) (HCC)    Hyperlipidemia    Hypertension    Sleep apnea    STEMI (ST elevation myocardial infarction) (HCC)    Stroke (HCC)     Tobacco Use: Social History   Tobacco Use  Smoking Status Former   Current packs/day: 1.00   Average packs/day: 1 pack/day for 24.0 years (24.0 ttl pk-yrs)   Types: Cigarettes  Smokeless Tobacco Never    Labs: Review Flowsheet       Latest Ref Rng & Units 08/13/2022  Labs for ITP Cardiac and Pulmonary Rehab  Cholestrol 0 - 200 mg/dL 858   LDL (calc) 0 -  99 mg/dL 73   HDL-C >59 mg/dL 53   Trlycerides <849 mg/dL 76   Hemoglobin J8r 4.8 - 5.6 % 5.9      Exercise Target Goals: Exercise Program Goal: Individual exercise prescription set using results from initial 6 min walk test and THRR while considering  patient's activity barriers and safety.   Exercise Prescription Goal: Initial exercise prescription builds to 30-45 minutes a day of aerobic activity, 2-3 days per week.  Home exercise guidelines will be given to patient during program as part of exercise prescription that the participant will acknowledge.   Education: Aerobic Exercise: - Group verbal and visual presentation on the components of exercise prescription. Introduces F.I.T.T principle from ACSM for exercise prescriptions.  Reviews F.I.T.T. principles of aerobic exercise including  progression. Written material provided at class time.   Education: Resistance Exercise: - Group verbal and visual presentation on the components of exercise prescription. Introduces F.I.T.T principle from ACSM for exercise prescriptions  Reviews F.I.T.T. principles of resistance exercise including progression. Written material provided at class time.    Education: Exercise & Equipment Safety: - Individual verbal instruction and demonstration of equipment use and safety with use of the equipment. Flowsheet Row Cardiac Rehab from 04/28/2024 in [facility] Cardiac and Pulmonary Rehab  Date 04/28/24  Educator NT  Instruction Review Code 1- Verbalizes Understanding    Education: Exercise Physiology & General Exercise Guidelines: - Group verbal and written instruction with models to review the exercise physiology of the cardiovascular system and associated critical values. Provides general exercise guidelines with specific guidelines to those with heart or lung disease. Written material provided at class time. Flowsheet Row Cardiac Rehab from 08/27/2016 in [facility] Cardiac and Pulmonary Rehab  Date 08/13/16  Educator [facility]  Instruction Review Code (retired) 2- meets goals/outcomes    Education: Flexibility, Balance, Mind/Body Relaxation: - Group verbal and visual presentation with interactive activity on the components of exercise prescription. Introduces F.I.T.T principle from ACSM for exercise prescriptions. Reviews F.I.T.T. principles of flexibility and balance exercise training including progression. Also discusses the mind body connection.  Reviews various relaxation techniques to help reduce and manage stress (i.e. Deep breathing, progressive muscle relaxation, and visualization). Balance handout provided to take home. Written material provided at class time.   Activity Barriers & Risk Stratification:  Activity Barriers & Cardiac Risk Stratification - 04/23/24 1445       Activity Barriers & Cardiac  Risk Stratification   Activity Barriers None    Cardiac Risk Stratification High          6 Minute Walk:  6 Minute Walk     Row Name 04/28/24 0945         6 Minute Walk   Phase Initial     Distance 1240 feet     Walk Time 6 minutes     # of Rest Breaks 0     MPH 2.35     METS 2.82     RPE 11     Perceived Dyspnea  0     VO2 Peak 9.86     Symptoms No     Resting HR 75 bpm     Resting BP 112/64     Resting Oxygen Saturation  94 %     Exercise Oxygen Saturation  during 6 min walk 97 %     Max Ex. HR 122 bpm     Max Ex. BP 134/72     2 Minute Post BP 128/68  Oxygen Initial Assessment:   Oxygen Re-Evaluation:   Oxygen Discharge (Final Oxygen Re-Evaluation):   Initial Exercise Prescription:  Initial Exercise Prescription - 04/28/24 0900       Date of Initial Exercise RX and Referring Provider   Date 04/28/24    Referring Provider Dr. Cara Lovelace, MD      Oxygen   Maintain Oxygen Saturation 88% or higher      Treadmill   MPH 2.4    Grade 0    Minutes 15    METs 2.84      NuStep   Level 3   T6 nustep   SPM 80    Minutes 15    METs 2.82      REL-XR   Level 3    Speed 50    Minutes 15    METs 2.82      T5 Nustep   Level 2    SPM 80    Minutes 15    METs 2.82      Prescription Details   Frequency (times per week) 3    Duration Progress to 30 minutes of continuous aerobic without signs/symptoms of physical distress      Intensity   THRR 40-80% of Max Heartrate 108-141    Ratings of Perceived Exertion 11-13    Perceived Dyspnea 0-4      Progression   Progression Continue to progress workloads to maintain intensity without signs/symptoms of physical distress.      Resistance Training   Training Prescription Yes    Weight 4 lb    Reps 10-15          Perform Capillary Blood Glucose checks as needed.  Exercise Prescription Changes:   Exercise Prescription Changes     Row Name 04/28/24 0900             Response  to Exercise   Blood Pressure (Admit) 112/64       Blood Pressure (Exercise) 134/72       Blood Pressure (Exit) 128/68       Heart Rate (Admit) 75 bpm       Heart Rate (Exercise) 122 bpm       Heart Rate (Exit) 96 bpm       Oxygen Saturation (Admit) 94 %       Oxygen Saturation (Exercise) 97 %       Rating of Perceived Exertion (Exercise) 11       Perceived Dyspnea (Exercise) 0       Symptoms none       Comments Results          Exercise Comments:   Exercise Goals and Review:   Exercise Goals     Row Name 04/28/24 0946             Exercise Goals   Increase Physical Activity Yes       Intervention Provide advice, education, support and counseling about physical activity/exercise needs.;Develop an individualized exercise prescription for aerobic and resistive training based on initial evaluation findings, risk stratification, comorbidities and participant's personal goals.       Expected Outcomes Short Term: Attend rehab on a regular basis to increase amount of physical activity.;Long Term: Add in home exercise to make exercise part of routine and to increase amount of physical activity.;Long Term: Exercising regularly at least 3-5 days a week.       Increase Strength and Stamina Yes       Intervention Provide advice, education, support  and counseling about physical activity/exercise needs.;Develop an individualized exercise prescription for aerobic and resistive training based on initial evaluation findings, risk stratification, comorbidities and participant's personal goals.       Expected Outcomes Short Term: Increase workloads from initial exercise prescription for resistance, speed, and METs.;Short Term: Perform resistance training exercises routinely during rehab and add in resistance training at home;Long Term: Improve cardiorespiratory fitness, muscular endurance and strength as measured by increased METs and functional capacity ( )       Able to understand and use  rate of perceived exertion (RPE) scale Yes       Intervention Provide education and explanation on how to use RPE scale       Expected Outcomes Short Term: Able to use RPE daily in rehab to express subjective intensity level;Long Term:  Able to use RPE to guide intensity level when exercising independently       Able to understand and use Dyspnea scale Yes       Intervention Provide education and explanation on how to use Dyspnea scale       Expected Outcomes Long Term: Able to use Dyspnea scale to guide intensity level when exercising independently;Short Term: Able to use Dyspnea scale daily in rehab to express subjective sense of shortness of breath during exertion       Knowledge and understanding of Target Heart Rate Range (THRR) Yes       Intervention Provide education and explanation of THRR including how the numbers were predicted and where they are located for reference       Expected Outcomes Short Term: Able to state/look up THRR;Long Term: Able to use THRR to govern intensity when exercising independently;Short Term: Able to use daily as guideline for intensity in rehab       Able to check pulse independently Yes       Intervention Provide education and demonstration on how to check pulse in carotid and radial arteries.;Review the importance of being able to check your own pulse for safety during independent exercise       Expected Outcomes Short Term: Able to explain why pulse checking is important during independent exercise;Long Term: Able to check pulse independently and accurately       Understanding of Exercise Prescription Yes       Intervention Provide education, explanation, and written materials on patient's individual exercise prescription       Expected Outcomes Short Term: Able to explain program exercise prescription;Long Term: Able to explain home exercise prescription to exercise independently          Exercise Goals Re-Evaluation :   Discharge Exercise Prescription  (Final Exercise Prescription Changes):  Exercise Prescription Changes - 04/28/24 0900       Response to Exercise   Blood Pressure (Admit) 112/64    Blood Pressure (Exercise) 134/72    Blood Pressure (Exit) 128/68    Heart Rate (Admit) 75 bpm    Heart Rate (Exercise) 122 bpm    Heart Rate (Exit) 96 bpm    Oxygen Saturation (Admit) 94 %    Oxygen Saturation (Exercise) 97 %    Rating of Perceived Exertion (Exercise) 11    Perceived Dyspnea (Exercise) 0    Symptoms none    Comments Results          Nutrition:  Target Goals: Understanding of nutrition guidelines, daily intake of sodium 1500mg , cholesterol 200mg , calories 30% from fat and 7% or less from saturated fats, daily to have 5 or  more servings of fruits and vegetables.  Education: Nutrition 1 -Group instruction provided by verbal, written material, interactive activities, discussions, models, and posters to present general guidelines for heart healthy nutrition including macronutrients, label reading, and promoting whole foods over processed counterparts. Education serves as Pensions consultant of discussion of heart healthy eating for all. Written material provided at class time.    Education: Nutrition 2 -Group instruction provided by verbal, written material, interactive activities, discussions, models, and posters to present general guidelines for heart healthy nutrition including sodium, cholesterol, and saturated fat. Providing guidance of habit forming to improve blood pressure, cholesterol, and body weight. Written material provided at class time.     Biometrics:  Pre Biometrics - 04/28/24 0947       Pre Biometrics   Height 5' 8 (1.727 m)    Weight 254 lb 8 oz (115.4 kg)    Waist Circumference 48 inches    Hip Circumference 45.5 inches    Waist to Hip Ratio 1.05 %    BMI (Calculated) 38.71    Single Leg Stand 2.13 seconds           Nutrition Therapy Plan and Nutrition Goals:   Nutrition  Assessments:  MEDIFICTS Score Key: >=70 Need to make dietary changes  40-70 Heart Healthy Diet <= 40 Therapeutic Level Cholesterol Diet   Picture Your Plate Scores: <59 Unhealthy dietary pattern with much room for improvement. 41-50 Dietary pattern unlikely to meet recommendations for good health and room for improvement. 51-60 More healthful dietary pattern, with some room for improvement.  >60 Healthy dietary pattern, although there may be some specific behaviors that could be improved.    Nutrition Goals Re-Evaluation:   Nutrition Goals Discharge (Final Nutrition Goals Re-Evaluation):   Psychosocial: Target Goals: Acknowledge presence or absence of significant depression and/or stress, maximize coping skills, provide positive support system. Participant is able to verbalize types and ability to use techniques and skills needed for reducing stress and depression.   Education: Stress, Anxiety, and Depression - Group verbal and visual presentation to define topics covered.  Reviews how body is impacted by stress, anxiety, and depression.  Also discusses healthy ways to reduce stress and to treat/manage anxiety and depression. Written material provided at class time. Flowsheet Row Cardiac Rehab from 08/27/2016 in [facility] Cardiac and Pulmonary Rehab  Date 08/01/16  Educator [facility]  Instruction Review Code (retired) 2- meets goals/outcomes    Education: Sleep Hygiene -Provides group verbal and written instruction about how sleep can affect your health.  Define sleep hygiene, discuss sleep cycles and impact of sleep habits. Review good sleep hygiene tips.   Initial Review & Psychosocial Screening:  Initial Psych Review & Screening - 04/23/24 1449       Initial Review   Current issues with None Identified      Family Dynamics   Good Support System? Yes   wife, son, mother   Comments Trigo 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 He is now home with his  wife and states he is feeling well. He reports that he has had decreased appetite and changes in the taste of food, but attributes this to medication changes, recent intubation, and the fact that he stopped smoking 5 months ago. Burrel reports that he is ready to get back to his active lifestyle. He does admit to episodes of feeling down or increased stress, however states that his wife and son offer a good support system and he enjoys walking his dog  when he starts to feel this way. He is looking forward to starting the program again and verbalized understanding of the need to be vocal about how he was feeling and if anything was causing discomfort, specifically at his incision.      Barriers   Psychosocial barriers to participate in program The patient should benefit from training in stress management and relaxation.      Screening Interventions   Interventions Encouraged to exercise;To provide support and resources with identified psychosocial needs    Expected Outcomes Short Term goal: Utilizing psychosocial counselor, staff and physician to assist with identification of specific Stressors or current issues interfering with healing process. Setting desired goal for each stressor or current issue identified.;Long Term Goal: Stressors or current issues are controlled or eliminated.;Short Term goal: Identification and review with participant of any Quality of Life or Depression concerns found by scoring the questionnaire.;Long Term goal: The participant improves quality of Life and PHQ9 Scores as seen by post scores and/or verbalization of changes          Quality of Life Scores:   Scores of 19 and below usually indicate a poorer quality of life in these areas.  A difference of  2-3 points is a clinically meaningful difference.  A difference of 2-3 points in the total score of the Quality of Life Index has been associated with significant improvement in overall quality of life, self-image,  physical symptoms, and general health in studies assessing change in quality of life.  PHQ-9: Review Flowsheet       04/28/2024 07/30/2016  Depression screen PHQ 2/9  Decreased Interest 1 0  Down, Depressed, Hopeless 1 0  PHQ - 2 Score 2 0  Altered sleeping 2 1  Tired, decreased energy 1 1  Change in appetite 1 1  Feeling bad or failure about yourself  0 0  Trouble concentrating 0 0  Moving slowly or fidgety/restless 0 0  Suicidal thoughts 0 0   PHQ-9 Score 6 3  Difficult doing work/chores Not difficult at all Not difficult at all    Details       Data saved with a previous flowsheet row definition        Interpretation of Total Score  Total Score Depression Severity:  1-4 = Minimal depression, 5-9 = Mild depression, 10-14 = Moderate depression, 15-19 = Moderately severe depression, 20-27 = Severe depression   Psychosocial Evaluation and Intervention:  Psychosocial Evaluation - 04/23/24 1452       Psychosocial Evaluation & Interventions   Interventions Encouraged to exercise with the program and follow exercise prescription;Relaxation education;Stress management education    Comments Vaden 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 He is now home with his wife and states he is feeling well. He reports that he has had decreased appetite and changes in the taste of food, but attributes this to medication changes, recent intubation, and the fact that he stopped smoking 5 months ago. Leshon reports that he is ready to get back to his active lifestyle. He does admit to episodes of feeling down or increased stress, however states that his wife and son offer a good support system and he enjoys walking his dog when he starts to feel this way. He is looking forward to starting the program again and verbalized understanding of the need to be vocal about how he was feeling and if anything was causing discomfort, specifically at his incision.    Expected  Outcomes Short:  Attend cardiac rehab for education and exercise. Long: Develop and maintain positive self care habits.    Continue Psychosocial Services  Follow up required by staff          Psychosocial Re-Evaluation:   Psychosocial Discharge (Final Psychosocial Re-Evaluation):   Vocational Rehabilitation: Provide vocational rehab assistance to qualifying candidates.   Vocational Rehab Evaluation & Intervention:  Vocational Rehab - 04/23/24 1449       Initial Vocational Rehab Evaluation & Intervention   Assessment shows need for Vocational Rehabilitation No   patient is retired         Education: Education Goals: Education classes will be provided on a variety of topics geared toward better understanding of heart health and risk factor modification. Participant will state understanding/return demonstration of topics presented as noted by education test scores.  Learning Barriers/Preferences:  Learning Barriers/Preferences - 04/23/24 1449       Learning Barriers/Preferences   Learning Barriers None    Learning Preferences None          General Cardiac Education Topics:  AED/CPR: - Group verbal and written instruction with the use of models to demonstrate the basic use of the AED with the basic ABC's of resuscitation.   Test and Procedures: - Group verbal and visual presentation and models provide information about basic cardiac anatomy and function. Reviews the testing methods done to diagnose heart disease and the outcomes of the test results. Describes the treatment choices: Medical Management, Angioplasty, or Coronary Bypass Surgery for treating various heart conditions including Myocardial Infarction, Angina, Valve Disease, and Cardiac Arrhythmias. Written material provided at class time. Flowsheet Row Cardiac Rehab from 08/27/2016 in [facility] Cardiac and Pulmonary Rehab  Date 08/27/16  Educator MA  Instruction Review Code (retired) 2- meets goals/outcomes     Medication Safety: - Group verbal and visual instruction to review commonly prescribed medications for heart and lung disease. Reviews the medication, class of the drug, and side effects. Includes the steps to properly store meds and maintain the prescription regimen. Written material provided at class time.   Intimacy: - Group verbal instruction through game format to discuss how heart and lung disease can affect sexual intimacy. Written material provided at class time.   Know Your Numbers and Heart Failure: - Group verbal and visual instruction to discuss disease risk factors for cardiac and pulmonary disease and treatment options.  Reviews associated critical values for Overweight/Obesity, Hypertension, Cholesterol, and Diabetes.  Discusses basics of heart failure: signs/symptoms and treatments.  Introduces Heart Failure Zone chart for action plan for heart failure. Written material provided at class time. Flowsheet Row Cardiac Rehab from 08/27/2016 in [facility] Cardiac and Pulmonary Rehab  Date 08/27/16  Educator MA  Instruction Review Code (retired) 2- meets goals/outcomes    Infection Prevention: - Provides verbal and written material to individual with discussion of infection control including proper hand washing and proper equipment cleaning during exercise session. Flowsheet Row Cardiac Rehab from 04/28/2024 in [facility] Cardiac and Pulmonary Rehab  Date 04/28/24  Educator NT  Instruction Review Code 1- Verbalizes Understanding    Falls Prevention: - Provides verbal and written material to individual with discussion of falls prevention and safety. Flowsheet Row Cardiac Rehab from 04/28/2024 in [facility] Cardiac and Pulmonary Rehab  Date 04/28/24  Educator NT  Instruction Review Code 1- Verbalizes Understanding    Other: -Provides group and verbal instruction on various topics (see comments)   Knowledge Questionnaire Score:   Core Components/Risk Factors/Patient Goals at  Admission:  Personal  Goals and Risk Factors at Admission - 04/23/24 1447       Core Components/Risk Factors/Patient Goals on Admission    Weight Management Yes;Weight Loss    Intervention Weight Management: Develop a combined nutrition and exercise program designed to reach desired caloric intake, while maintaining appropriate intake of nutrient and fiber, sodium and fats, and appropriate energy expenditure required for the weight goal.;Weight Management: Provide education and appropriate resources to help participant work on and attain dietary goals.;Weight Management/Obesity: Establish reasonable short term and long term weight goals.;Obesity: Provide education and appropriate resources to help participant work on and attain dietary goals.    Goal Weight: Long Term 225 lb (102.1 kg)    Expected Outcomes Short Term: Continue to assess and modify interventions until short term weight is achieved;Weight Loss: Understanding of general recommendations for a balanced deficit meal plan, which promotes 1-2 lb weight loss per week and includes a negative energy balance of [phone] kcal/d;Long Term: Adherence to nutrition and physical activity/exercise program aimed toward attainment of established weight goal;Understanding recommendations for meals to include 15-35% energy as protein, 25-35% energy from fat, 35-60% energy from carbohydrates, less than 200mg  of dietary cholesterol, 20-35 gm of total fiber daily;Understanding of distribution of calorie intake throughout the day with the consumption of 4-5 meals/snacks    Hypertension Yes    Intervention Provide education on lifestyle modifcations including regular physical activity/exercise, weight management, moderate sodium restriction and increased consumption of fresh fruit, vegetables, and low fat dairy, alcohol moderation, and smoking cessation.;Monitor prescription use compliance.    Expected Outcomes Short Term: Continued assessment and intervention until  BP is < 140/94mm HG in hypertensive participants. < 130/12mm HG in hypertensive participants with diabetes, heart failure or chronic kidney disease.;Long Term: Maintenance of blood pressure at goal levels.    Lipids Yes    Intervention Provide education and support for participant on nutrition & aerobic/resistive exercise along with prescribed medications to achieve LDL 70mg , HDL >40mg .    Expected Outcomes Short Term: Participant states understanding of desired cholesterol values and is compliant with medications prescribed. Participant is following exercise prescription and nutrition guidelines.;Long Term: Cholesterol controlled with medications as prescribed, with individualized exercise RX and with personalized nutrition plan. Value goals: LDL < 70mg , HDL > 40 mg.          Education:Diabetes - Individual verbal and written instruction to review signs/symptoms of diabetes, desired ranges of glucose level fasting, after meals and with exercise. Acknowledge that pre and post exercise glucose checks will be done for 3 sessions at entry of program.   Core Components/Risk Factors/Patient Goals Review:    Core Components/Risk Factors/Patient Goals at Discharge (Final Review):    ITP Comments:  ITP Comments     Row Name 04/23/24 1444 04/28/24 0943         ITP Comments Initial phone call completed. Diagnosis can be found in [facility] 03/16/2024. EP Orientation scheduled for Tuesday 10/7 at 8:00. Completed and gym orientation for cardiac rehab. Initial ITP created and sent for review to Dr. Oneil Pinal, Medical Director.         Comments: Initial ITP

## 2024-04-28 NOTE — Patient Instructions (Signed)
 Patient Instructions  Patient Details  Name: Shaun Anderson MRN: [id number] Date of Birth: 01-Nov-1960 Referring Provider:  Florencio Cara BIRCH, MD  Below are your personal goals for exercise, nutrition, and risk factors. Our goal is to help you stay on track towards obtaining and maintaining these goals. We will be discussing your progress on these goals with you throughout the program.  Initial Exercise Prescription:  Initial Exercise Prescription - 04/28/24 0900       Date of Initial Exercise RX and Referring Provider   Date 04/28/24    Referring Provider Dr. Cara Florencio, MD      Oxygen   Maintain Oxygen Saturation 88% or higher      Treadmill   MPH 2.4    Grade 0    Minutes 15    METs 2.84      NuStep   Level 3   T6 nustep   SPM 80    Minutes 15    METs 2.82      REL-XR   Level 3    Speed 50    Minutes 15    METs 2.82      T5 Nustep   Level 2    SPM 80    Minutes 15    METs 2.82      Prescription Details   Frequency (times per week) 3    Duration Progress to 30 minutes of continuous aerobic without signs/symptoms of physical distress      Intensity   THRR 40-80% of Max Heartrate 108-141    Ratings of Perceived Exertion 11-13    Perceived Dyspnea 0-4      Progression   Progression Continue to progress workloads to maintain intensity without signs/symptoms of physical distress.      Resistance Training   Training Prescription Yes    Weight 4 lb    Reps 10-15          Exercise Goals: Frequency: Be able to perform aerobic exercise two to three times per week in program working toward 2-5 days per week of home exercise.  Intensity: Work with a perceived exertion of 11 (fairly light) - 15 (hard) while following your exercise prescription.  We will make changes to your prescription with you as you progress through the program.   Duration: Be able to do 30 to 45 minutes of continuous aerobic exercise in addition to a 5 minute warm-up and a 5  minute cool-down routine.   Nutrition Goals: Your personal nutrition goals will be established when you do your nutrition analysis with the dietician.  The following are general nutrition guidelines to follow: Cholesterol < 200mg /day Sodium < 1500mg /day Fiber: Men over 50 yrs - 30 grams per day  Personal Goals:  Personal Goals and Risk Factors at Admission - 04/23/24 1447       Core Components/Risk Factors/Patient Goals on Admission    Weight Management Yes;Weight Loss    Intervention Weight Management: Develop a combined nutrition and exercise program designed to reach desired caloric intake, while maintaining appropriate intake of nutrient and fiber, sodium and fats, and appropriate energy expenditure required for the weight goal.;Weight Management: Provide education and appropriate resources to help participant work on and attain dietary goals.;Weight Management/Obesity: Establish reasonable short term and long term weight goals.;Obesity: Provide education and appropriate resources to help participant work on and attain dietary goals.    Goal Weight: Long Term 225 lb (102.1 kg)    Expected Outcomes Short Term: Continue to assess and modify  interventions until short term weight is achieved;Weight Loss: Understanding of general recommendations for a balanced deficit meal plan, which promotes 1-2 lb weight loss per week and includes a negative energy balance of [phone] kcal/d;Long Term: Adherence to nutrition and physical activity/exercise program aimed toward attainment of established weight goal;Understanding recommendations for meals to include 15-35% energy as protein, 25-35% energy from fat, 35-60% energy from carbohydrates, less than 200mg  of dietary cholesterol, 20-35 gm of total fiber daily;Understanding of distribution of calorie intake throughout the day with the consumption of 4-5 meals/snacks    Hypertension Yes    Intervention Provide education on lifestyle modifcations including  regular physical activity/exercise, weight management, moderate sodium restriction and increased consumption of fresh fruit, vegetables, and low fat dairy, alcohol moderation, and smoking cessation.;Monitor prescription use compliance.    Expected Outcomes Short Term: Continued assessment and intervention until BP is < 140/22mm HG in hypertensive participants. < 130/32mm HG in hypertensive participants with diabetes, heart failure or chronic kidney disease.;Long Term: Maintenance of blood pressure at goal levels.    Lipids Yes    Intervention Provide education and support for participant on nutrition & aerobic/resistive exercise along with prescribed medications to achieve LDL 70mg , HDL >40mg .    Expected Outcomes Short Term: Participant states understanding of desired cholesterol values and is compliant with medications prescribed. Participant is following exercise prescription and nutrition guidelines.;Long Term: Cholesterol controlled with medications as prescribed, with individualized exercise RX and with personalized nutrition plan. Value goals: LDL < 70mg , HDL > 40 mg.         Exercise Goals and Review:  Exercise Goals     Row Name 04/28/24 0946             Exercise Goals   Increase Physical Activity Yes       Intervention Provide advice, education, support and counseling about physical activity/exercise needs.;Develop an individualized exercise prescription for aerobic and resistive training based on initial evaluation findings, risk stratification, comorbidities and participant's personal goals.       Expected Outcomes Short Term: Attend rehab on a regular basis to increase amount of physical activity.;Long Term: Add in home exercise to make exercise part of routine and to increase amount of physical activity.;Long Term: Exercising regularly at least 3-5 days a week.       Increase Strength and Stamina Yes       Intervention Provide advice, education, support and counseling about  physical activity/exercise needs.;Develop an individualized exercise prescription for aerobic and resistive training based on initial evaluation findings, risk stratification, comorbidities and participant's personal goals.       Expected Outcomes Short Term: Increase workloads from initial exercise prescription for resistance, speed, and METs.;Short Term: Perform resistance training exercises routinely during rehab and add in resistance training at home;Long Term: Improve cardiorespiratory fitness, muscular endurance and strength as measured by increased METs and functional capacity ( )       Able to understand and use rate of perceived exertion (RPE) scale Yes       Intervention Provide education and explanation on how to use RPE scale       Expected Outcomes Short Term: Able to use RPE daily in rehab to express subjective intensity level;Long Term:  Able to use RPE to guide intensity level when exercising independently       Able to understand and use Dyspnea scale Yes       Intervention Provide education and explanation on how to use Dyspnea scale  Expected Outcomes Long Term: Able to use Dyspnea scale to guide intensity level when exercising independently;Short Term: Able to use Dyspnea scale daily in rehab to express subjective sense of shortness of breath during exertion       Knowledge and understanding of Target Heart Rate Range (THRR) Yes       Intervention Provide education and explanation of THRR including how the numbers were predicted and where they are located for reference       Expected Outcomes Short Term: Able to state/look up THRR;Long Term: Able to use THRR to govern intensity when exercising independently;Short Term: Able to use daily as guideline for intensity in rehab       Able to check pulse independently Yes       Intervention Provide education and demonstration on how to check pulse in carotid and radial arteries.;Review the importance of being able to check your own  pulse for safety during independent exercise       Expected Outcomes Short Term: Able to explain why pulse checking is important during independent exercise;Long Term: Able to check pulse independently and accurately       Understanding of Exercise Prescription Yes       Intervention Provide education, explanation, and written materials on patient's individual exercise prescription       Expected Outcomes Short Term: Able to explain program exercise prescription;Long Term: Able to explain home exercise prescription to exercise independently

## 2024-04-29 ENCOUNTER — Encounter

## 2024-04-29 ENCOUNTER — Ambulatory Visit

## 2024-04-29 DIAGNOSIS — Z951 Presence of aortocoronary bypass graft: Secondary | ICD-10-CM

## 2024-04-29 NOTE — Progress Notes (Signed)
 Assessment start time: 10:14 AM  Digestive issues/concerns: no known food allergies   24-hours Recall: B: boiled or scrambled egg, sometimes bacon L: hot dog or hamburger or chicken noodle soup  D: hamburger casserole, green beans, baked beans Snack: frozen popsicles  Beverages mt dew 2-3 per day but now has cut back. water  (~64oz), whole milk  Education r/t nutrition plan Orel reports he cut out soda four or five months ago when he found out he might need stents. He said he was trying to be healthier. Commended him on this change. Educated on how cutting back on soda is overall healthy but will have the biggest impact on his A1C, which was measured at 6.0% 1 month ago. Spoke with him about heart health and how saturated fats, sodium and cholesterol were the nutrients to monitor for heart health. Reviewed his food recall and discussed the high saturated fat, sodium and cholesterol in these foods he eats often. He admits it will be a lot to change to be more heart healthy. Encouraged him to work on it and focus on improving and not meeting perfect standards before the program ends. Provided guideline limits to work towards of less than 12g saturated fat, less than 1500mg  sodium, and less than 250mg  cholesterol. Reviewed mediterranean diet handout, educated on types of fats, sources, and how to read labels.   Goal 1: Read labels and reduce sodium intake to below 2300mg . Ideally 1500mg  per day.  Goal 2: Reduce saturated fat, less than 12g per day. Replace bad fats for more heart healthy fats.  Goal 3: Include more colorful produce, aim for 5-8 servings of fruits and veggies per day  End time 10:47 AM

## 2024-04-29 NOTE — Progress Notes (Signed)
 Daily Session Note  Patient Details  Name: Shaun Anderson MRN: [id number] Date of Birth: 24-Jul-1960 Referring Provider:   Flowsheet Row Cardiac Rehab from 04/28/2024 in [facility] Cardiac and Pulmonary Rehab  Referring Provider Dr. Cara Lovelace, MD    Encounter Date: 04/29/2024  Check In:  Session Check In - 04/29/24 0950       Check-In   Supervising physician immediately available to respond to emergencies See telemetry face sheet for immediately available ER MD    Location [facility]-Cardiac & Pulmonary Rehab    Staff Present Burnard Davenport [facility] Peggi, RN, DNP, NE-BC;Joseph [facility] RN,BSN;Margaret Best, MS, Exercise Physiologist;Fleur Audino Dyane HECKLE, ACSM CEP, Exercise Physiologist    Virtual Visit No    Medication changes reported     No    Fall or balance concerns reported    No    Warm-up and Cool-down Performed on first and last piece of equipment    Resistance Training Performed Yes    VAD Patient? No    PAD/SET Patient? No      Pain Assessment   Currently in Pain? No/denies             Social History   Tobacco Use  Smoking Status Former   Current packs/day: 1.00   Average packs/day: 1 pack/day for 24.0 years (24.0 ttl pk-yrs)   Types: Cigarettes  Smokeless Tobacco Never    Goals Met:  Independence with exercise equipment Exercise tolerated well No report of concerns or symptoms today Strength training completed today  Goals Unmet:  Not Applicable  Comments: First full day of exercise!  Patient was oriented to gym and equipment including functions, settings, policies, and procedures.  Patient's individual exercise prescription and treatment plan were reviewed.  All starting workloads were established based on the results of the 6 minute walk test done at initial orientation visit.  The plan for exercise progression was also introduced and progression will be customized based on patient's performance and goals.    Dr. Oneil Pinal  is Medical Director for [facility] Cardiac Rehabilitation.  Dr. Fuad Aleskerov is Medical Director for [facility] Pulmonary Rehabilitation.

## 2024-05-01 ENCOUNTER — Encounter: Admitting: *Deleted

## 2024-05-01 DIAGNOSIS — Z951 Presence of aortocoronary bypass graft: Secondary | ICD-10-CM

## 2024-05-01 NOTE — Progress Notes (Signed)
 Daily Session Note  Patient Details  Name: Shaun Anderson MRN: [id number] Date of Birth: [birth year] Referring Provider:   Flowsheet Row Cardiac Rehab from 04/28/2024 in [facility] Cardiac and Pulmonary Rehab  Referring Provider Dr. Cara Lovelace, MD    Encounter Date: 05/01/2024  Check In:  Session Check In - 05/01/24 0929       Check-In   Supervising physician immediately available to respond to emergencies See telemetry face sheet for immediately available ER MD    Location [facility]-Cardiac & Pulmonary Rehab    Staff Present Bruno Mirza RN,BSN;Maxon [place]side BS, Exercise Physiologist;Mary Godley, RN, DNP, NE-BC;Noah Tickle, BS, Exercise Physiologist    Virtual Visit No    Medication changes reported     No    Fall or balance concerns reported    No    Warm-up and Cool-down Performed on first and last piece of equipment    Resistance Training Performed Yes    VAD Patient? No    PAD/SET Patient? No      Pain Assessment   Currently in Pain? No/denies             Social History   Tobacco Use  Smoking Status Former   Current packs/day: 1.00   Average packs/day: 1 pack/day for 24.0 years (24.0 ttl pk-yrs)   Types: Cigarettes  Smokeless Tobacco Never    Goals Met:  Independence with exercise equipment Exercise tolerated well No report of concerns or symptoms today Strength training completed today  Goals Unmet:  Not Applicable  Comments: Pt able to follow exercise prescription today without complaint.  Will continue to monitor for progression.    Dr. Oneil Pinal is Medical Director for [facility] Cardiac Rehabilitation.  Dr. Fuad Aleskerov is Medical Director for [facility] Pulmonary Rehabilitation.

## 2024-05-04 ENCOUNTER — Encounter

## 2024-05-04 DIAGNOSIS — Z951 Presence of aortocoronary bypass graft: Secondary | ICD-10-CM

## 2024-05-04 NOTE — Progress Notes (Signed)
 Daily Session Note  Patient Details  Name: Shaun Anderson MRN: [id number] Date of Birth: 09-Oct-1960 Referring Provider:   Flowsheet Row Cardiac Rehab from 04/28/2024 in [facility] Cardiac and Pulmonary Rehab  Referring Provider Dr. Cara Lovelace, MD    Encounter Date: 05/04/2024  Check In:  Session Check In - 05/04/24 0918       Check-In   Supervising physician immediately available to respond to emergencies See telemetry face sheet for immediately available ER MD    Location [facility]-Cardiac & Pulmonary Rehab    Staff Present Burnard Davenport RN,BSN,MPA;Joseph Hood RCP,RRT,BSRT;Maxon Burnell BS, Exercise Physiologist;Jennalee Greaves Dyane HECKLE, ACSM CEP, Exercise Physiologist    Virtual Visit No    Medication changes reported     No    Fall or balance concerns reported    No    Warm-up and Cool-down Performed on first and last piece of equipment    Resistance Training Performed Yes    VAD Patient? No    PAD/SET Patient? No      Pain Assessment   Currently in Pain? No/denies             Social History   Tobacco Use  Smoking Status Former   Current packs/day: 1.00   Average packs/day: 1 pack/day for 24.0 years (24.0 ttl pk-yrs)   Types: Cigarettes  Smokeless Tobacco Never    Goals Met:  Independence with exercise equipment Exercise tolerated well No report of concerns or symptoms today Strength training completed today  Goals Unmet:  Not Applicable  Comments: Pt able to follow exercise prescription today without complaint.  Will continue to monitor for progression.    Dr. Oneil Pinal is Medical Director for [facility] Cardiac Rehabilitation.  Dr. Fuad Aleskerov is Medical Director for [facility] Pulmonary Rehabilitation.

## 2024-05-06 ENCOUNTER — Encounter

## 2024-05-06 DIAGNOSIS — Z951 Presence of aortocoronary bypass graft: Secondary | ICD-10-CM

## 2024-05-06 NOTE — Progress Notes (Signed)
 Daily Session Note  Patient Details  Name: Shaun Anderson MRN: [id number] Date of Birth: [birth year] Referring Provider:   Flowsheet Row Cardiac Rehab from 04/28/2024 in [facility] Cardiac and Pulmonary Rehab  Referring Provider Dr. Cara Lovelace, MD    Encounter Date: 05/06/2024  Check In:  Session Check In - 05/06/24 0736       Check-In   Supervising physician immediately available to respond to emergencies See telemetry face sheet for immediately available ER MD    Location [facility]-Cardiac & Pulmonary Rehab    Staff Present Burnard Davenport RN,BSN,MPA;Joseph [facility] RCP,RRT,BSRT;Margaret Best, MS, Exercise Physiologist;Jason Elnor RDN,LDN    Virtual Visit No    Medication changes reported     No    Fall or balance concerns reported    No    Warm-up and Cool-down Performed on first and last piece of equipment    Resistance Training Performed Yes    VAD Patient? No    PAD/SET Patient? No      Pain Assessment   Currently in Pain? No/denies             Social History   Tobacco Use  Smoking Status Former   Current packs/day: 1.00   Average packs/day: 1 pack/day for 24.0 years (24.0 ttl pk-yrs)   Types: Cigarettes  Smokeless Tobacco Never    Goals Met:  Independence with exercise equipment Exercise tolerated well No report of concerns or symptoms today Strength training completed today  Goals Unmet:  Not Applicable  Comments: Pt able to follow exercise prescription today without complaint.  Will continue to monitor for progression.    Dr. Oneil Pinal is Medical Director for [facility] Cardiac Rehabilitation.  Dr. Fuad Aleskerov is Medical Director for [facility] Pulmonary Rehabilitation.

## 2024-05-08 ENCOUNTER — Encounter

## 2024-05-08 DIAGNOSIS — Z951 Presence of aortocoronary bypass graft: Secondary | ICD-10-CM

## 2024-05-08 NOTE — Progress Notes (Signed)
 Daily Session Note  Patient Details  Name: Shaun Anderson MRN: [id number] Date of Birth: 1960-08-17 Referring Provider:   Flowsheet Row Cardiac Rehab from 04/28/2024 in [facility] Cardiac and Pulmonary Rehab  Referring Provider Dr. Cara Lovelace, MD    Encounter Date: 05/08/2024  Check In:  Session Check In - 05/08/24 0924       Check-In   Supervising physician immediately available to respond to emergencies See telemetry face sheet for immediately available ER MD    Location [facility]-Cardiac & Pulmonary Rehab    Staff Present Burnard Davenport RN,BSN,MPA;Maxon Conetta BS, Exercise Physiologist;Noah Tickle, BS, Exercise Physiologist;Laureen Delores, BS, RRT, CPFT    Virtual Visit No    Medication changes reported     No    Fall or balance concerns reported    No    Warm-up and Cool-down Performed on first and last piece of equipment    Resistance Training Performed Yes    VAD Patient? No    PAD/SET Patient? No      Pain Assessment   Currently in Pain? No/denies             Social History   Tobacco Use  Smoking Status Former   Current packs/day: 1.00   Average packs/day: 1 pack/day for 24.0 years (24.0 ttl pk-yrs)   Types: Cigarettes  Smokeless Tobacco Never    Goals Met:  Independence with exercise equipment Exercise tolerated well No report of concerns or symptoms today Strength training completed today  Goals Unmet:  Not Applicable  Comments: Pt able to follow exercise prescription today without complaint.  Will continue to monitor for progression.    Dr. Oneil Pinal is Medical Director for [facility] Cardiac Rehabilitation.  Dr. Fuad Aleskerov is Medical Director for [facility] Pulmonary Rehabilitation.

## 2024-05-11 ENCOUNTER — Encounter

## 2024-05-11 DIAGNOSIS — Z951 Presence of aortocoronary bypass graft: Secondary | ICD-10-CM | POA: Diagnosis not present

## 2024-05-11 NOTE — Progress Notes (Signed)
 Daily Session Note  Patient Details  Name: Shaun Anderson MRN: [id number] Date of Birth: [birth year] Referring Provider:   Flowsheet Row Cardiac Rehab from 04/28/2024 in [facility] Cardiac and Pulmonary Rehab  Referring Provider Dr. Cara Lovelace, MD    Encounter Date: 05/11/2024  Check In:  Session Check In - 05/11/24 9077       Check-In   Supervising physician immediately available to respond to emergencies See telemetry face sheet for immediately available ER MD    Location [facility]-Cardiac & Pulmonary Rehab    Staff Present Burnard Davenport RN,BSN,MPA;Joseph Hood RCP,RRT,BSRT;Maxon Burnell BS, Exercise Physiologist;Salima Rumer Dyane HECKLE, ACSM CEP, Exercise Physiologist;Jason Elnor RDN,LDN    Virtual Visit No    Medication changes reported     No    Fall or balance concerns reported    No    Warm-up and Cool-down Performed on first and last piece of equipment    Resistance Training Performed Yes    VAD Patient? No    PAD/SET Patient? No      Pain Assessment   Currently in Pain? No/denies             Social History   Tobacco Use  Smoking Status Former   Current packs/day: 1.00   Average packs/day: 1 pack/day for 24.0 years (24.0 ttl pk-yrs)   Types: Cigarettes  Smokeless Tobacco Never    Goals Met:  Independence with exercise equipment Exercise tolerated well No report of concerns or symptoms today Strength training completed today  Goals Unmet:  Not Applicable  Comments: Pt able to follow exercise prescription today without complaint.  Will continue to monitor for progression.    Dr. Oneil Pinal is Medical Director for [facility] Cardiac Rehabilitation.  Dr. Fuad Aleskerov is Medical Director for [facility] Pulmonary Rehabilitation.

## 2024-05-13 ENCOUNTER — Encounter: Payer: Self-pay | Admitting: *Deleted

## 2024-05-13 ENCOUNTER — Encounter

## 2024-05-13 DIAGNOSIS — Z951 Presence of aortocoronary bypass graft: Secondary | ICD-10-CM

## 2024-05-13 NOTE — Progress Notes (Signed)
 Cardiac Individual Treatment Plan  Patient Details  Name: Shaun Anderson MRN: [id number] Date of Birth: 1961-04-03 Referring Provider:   Flowsheet Row Cardiac Rehab from 04/28/2024 in [facility] Cardiac and Pulmonary Rehab  Referring Provider Dr. Cara Lovelace, MD    Initial Encounter Date:  Flowsheet Row Cardiac Rehab from 04/28/2024 in [facility] Cardiac and Pulmonary Rehab  Date 04/28/24    Visit Diagnosis: S/P CABG (coronary artery bypass graft)  Patient's Home Medications on Admission:  Current Outpatient Medications:    albuterol (ACCUNEB) 0.63 MG/3ML nebulizer solution, Take 1 ampule by nebulization every 6 (six) hours as needed for wheezing., Disp: , Rfl:    allopurinol (ZYLOPRIM) 100 MG tablet, Take 100 mg by mouth daily., Disp: , Rfl:    amiodarone (PACERONE) 200 MG tablet, Take 200 mg by mouth., Disp: , Rfl:    amLODipine (NORVASC) 2.5 MG tablet, Take 2.5 mg by mouth., Disp: , Rfl:    apixaban  (ELIQUIS ) 5 MG TABS tablet, Take 5 mg by mouth., Disp: , Rfl:    aspirin  EC 81 MG tablet, Take 81 mg by mouth daily as needed for mild pain, moderate pain or fever., Disp: , Rfl:    atorvastatin  (LIPITOR) 80 MG tablet, Take 1 tablet (80 mg total) by mouth daily., Disp: 90 tablet, Rfl: 4   atorvastatin  (LIPITOR) 80 MG tablet, Take 80 mg by mouth daily., Disp: , Rfl:    Cholecalciferol 50 MCG (2000 UT) CAPS, Take 2,000 Units by mouth., Disp: , Rfl:    clopidogrel  (PLAVIX ) 75 MG tablet, Take 75 mg by mouth daily., Disp: , Rfl:    empagliflozin (JARDIANCE) 10 MG TABS tablet, Take 10 mg by mouth daily., Disp: , Rfl:    ezetimibe  (ZETIA ) 10 MG tablet, Take 1 tablet (10 mg total) by mouth daily., Disp: 30 tablet, Rfl: 2   fluticasone (FLONASE) 50 MCG/ACT nasal spray, Place 1 spray into the nose., Disp: , Rfl:    fluticasone-salmeterol (ADVAIR) 100-50 MCG/ACT AEPB, Inhale into the lungs., Disp: , Rfl:    furosemide (LASIX) 40 MG tablet, Take by mouth., Disp: , Rfl:    ipratropium (ATROVENT) 0.03 %  nasal spray, Place 1 spray into the nose., Disp: , Rfl:    losartan (COZAAR) 50 MG tablet, Take 50 mg by mouth daily., Disp: , Rfl:    polyethylene glycol (MIRALAX / GLYCOLAX) 17 g packet, Take 17 g by mouth., Disp: , Rfl:    potassium chloride SA (KLOR-CON M) 20 MEQ tablet, Take by mouth., Disp: , Rfl:    predniSONE (DELTASONE) 20 MG tablet, Take 20 mg by mouth., Disp: , Rfl:    senna-docusate (SENOKOT-S) 8.6-50 MG tablet, Take 2 tablets by mouth., Disp: , Rfl:   Past Medical History: Past Medical History:  Diagnosis Date   A-fib (HCC)    CAD (coronary artery disease)    DVT (deep venous thrombosis) (HCC)    Hyperlipidemia    Hypertension    Sleep apnea    STEMI (ST elevation myocardial infarction) (HCC)    Stroke (HCC)     Tobacco Use: Social History   Tobacco Use  Smoking Status Former   Current packs/day: 1.00   Average packs/day: 1 pack/day for 24.0 years (24.0 ttl pk-yrs)   Types: Cigarettes  Smokeless Tobacco Never    Labs: Review Flowsheet       Latest Ref Rng & Units 08/13/2022  Labs for ITP Cardiac and Pulmonary Rehab  Cholestrol 0 - 200 mg/dL 858   LDL (calc) 0 -  99 mg/dL 73   HDL-C >59 mg/dL 53   Trlycerides <849 mg/dL 76   Hemoglobin J8r 4.8 - 5.6 % 5.9      Exercise Target Goals: Exercise Program Goal: Individual exercise prescription set using results from initial 6 min walk test and THRR while considering  patient's activity barriers and safety.   Exercise Prescription Goal: Initial exercise prescription builds to 30-45 minutes a day of aerobic activity, 2-3 days per week.  Home exercise guidelines will be given to patient during program as part of exercise prescription that the participant will acknowledge.   Education: Aerobic Exercise: - Group verbal and visual presentation on the components of exercise prescription. Introduces F.I.T.T principle from ACSM for exercise prescriptions.  Reviews F.I.T.T. principles of aerobic exercise including  progression. Written material provided at class time.   Education: Resistance Exercise: - Group verbal and visual presentation on the components of exercise prescription. Introduces F.I.T.T principle from ACSM for exercise prescriptions  Reviews F.I.T.T. principles of resistance exercise including progression. Written material provided at class time.    Education: Exercise & Equipment Safety: - Individual verbal instruction and demonstration of equipment use and safety with use of the equipment. Flowsheet Row Cardiac Rehab from 04/28/2024 in [facility] Cardiac and Pulmonary Rehab  Date 04/28/24  Educator NT  Instruction Review Code 1- Verbalizes Understanding    Education: Exercise Physiology & General Exercise Guidelines: - Group verbal and written instruction with models to review the exercise physiology of the cardiovascular system and associated critical values. Provides general exercise guidelines with specific guidelines to those with heart or lung disease. Written material provided at class time. Flowsheet Row Cardiac Rehab from 08/27/2016 in [facility] Cardiac and Pulmonary Rehab  Date 08/13/16  Educator [facility]  Instruction Review Code (retired) 2- meets goals/outcomes    Education: Flexibility, Balance, Mind/Body Relaxation: - Group verbal and visual presentation with interactive activity on the components of exercise prescription. Introduces F.I.T.T principle from ACSM for exercise prescriptions. Reviews F.I.T.T. principles of flexibility and balance exercise training including progression. Also discusses the mind body connection.  Reviews various relaxation techniques to help reduce and manage stress (i.e. Deep breathing, progressive muscle relaxation, and visualization). Balance handout provided to take home. Written material provided at class time.   Activity Barriers & Risk Stratification:  Activity Barriers & Cardiac Risk Stratification - 04/23/24 1445       Activity Barriers & Cardiac  Risk Stratification   Activity Barriers None    Cardiac Risk Stratification High          6 Minute Walk:  6 Minute Walk     Row Name 04/28/24 0945         6 Minute Walk   Phase Initial     Distance 1240 feet     Walk Time 6 minutes     # of Rest Breaks 0     MPH 2.35     METS 2.82     RPE 11     Perceived Dyspnea  0     VO2 Peak 9.86     Symptoms No     Resting HR 75 bpm     Resting BP 112/64     Resting Oxygen Saturation  94 %     Exercise Oxygen Saturation  during 6 min walk 97 %     Max Ex. HR 122 bpm     Max Ex. BP 134/72     2 Minute Post BP 128/68  Oxygen Initial Assessment:   Oxygen Re-Evaluation:   Oxygen Discharge (Final Oxygen Re-Evaluation):   Initial Exercise Prescription:  Initial Exercise Prescription - 04/28/24 0900       Date of Initial Exercise RX and Referring Provider   Date 04/28/24    Referring Provider Dr. Cara Lovelace, MD      Oxygen   Maintain Oxygen Saturation 88% or higher      Treadmill   MPH 2.4    Grade 0    Minutes 15    METs 2.84      NuStep   Level 3   T6 nustep   SPM 80    Minutes 15    METs 2.82      REL-XR   Level 3    Speed 50    Minutes 15    METs 2.82      T5 Nustep   Level 2    SPM 80    Minutes 15    METs 2.82      Prescription Details   Frequency (times per week) 3    Duration Progress to 30 minutes of continuous aerobic without signs/symptoms of physical distress      Intensity   THRR 40-80% of Max Heartrate 108-141    Ratings of Perceived Exertion 11-13    Perceived Dyspnea 0-4      Progression   Progression Continue to progress workloads to maintain intensity without signs/symptoms of physical distress.      Resistance Training   Training Prescription Yes    Weight 4 lb    Reps 10-15          Perform Capillary Blood Glucose checks as needed.  Exercise Prescription Changes:   Exercise Prescription Changes     Row Name 04/28/24 0900 05/07/24 1600            Response to Exercise   Blood Pressure (Admit) 112/64 130/80      Blood Pressure (Exercise) 134/72 142/82      Blood Pressure (Exit) 128/68 124/80      Heart Rate (Admit) 75 bpm 78 bpm      Heart Rate (Exercise) 122 bpm 133 bpm      Heart Rate (Exit) 96 bpm 91 bpm      Oxygen Saturation (Admit) 94 % --      Oxygen Saturation (Exercise) 97 % --      Rating of Perceived Exertion (Exercise) 11 15      Perceived Dyspnea (Exercise) 0 --      Symptoms none none      Comments Results 1st week of exercise sessions      Duration -- Progress to 30 minutes of  aerobic without signs/symptoms of physical distress      Intensity -- THRR unchanged        Progression   Progression -- Continue to progress workloads to maintain intensity without signs/symptoms of physical distress.      Average METs -- 2.94        Resistance Training   Training Prescription -- Yes      Weight -- 4 lb      Reps -- 10-15        Interval Training   Interval Training -- No        Treadmill   MPH -- 2.4      Grade -- 0      Minutes -- 15      METs -- 2.84  NuStep   Level -- 3  T6 nustep      Minutes -- 15      METs -- 4.5        REL-XR   Level -- 3      Minutes -- 15      METs -- 2.4        Oxygen   Maintain Oxygen Saturation -- 88% or higher         Exercise Comments:   Exercise Comments     Row Name 04/29/24 0951           Exercise Comments First full day of exercise!  Patient was oriented to gym and equipment including functions, settings, policies, and procedures.  Patient's individual exercise prescription and treatment plan were reviewed.  All starting workloads were established based on the results of the 6 minute walk test done at initial orientation visit.  The plan for exercise progression was also introduced and progression will be customized based on patient's performance and goals.          Exercise Goals and Review:   Exercise Goals     Row Name 04/28/24 0946              Exercise Goals   Increase Physical Activity Yes       Intervention Provide advice, education, support and counseling about physical activity/exercise needs.;Develop an individualized exercise prescription for aerobic and resistive training based on initial evaluation findings, risk stratification, comorbidities and participant's personal goals.       Expected Outcomes Short Term: Attend rehab on a regular basis to increase amount of physical activity.;Long Term: Add in home exercise to make exercise part of routine and to increase amount of physical activity.;Long Term: Exercising regularly at least 3-5 days a week.       Increase Strength and Stamina Yes       Intervention Provide advice, education, support and counseling about physical activity/exercise needs.;Develop an individualized exercise prescription for aerobic and resistive training based on initial evaluation findings, risk stratification, comorbidities and participant's personal goals.       Expected Outcomes Short Term: Increase workloads from initial exercise prescription for resistance, speed, and METs.;Short Term: Perform resistance training exercises routinely during rehab and add in resistance training at home;Long Term: Improve cardiorespiratory fitness, muscular endurance and strength as measured by increased METs and functional capacity ( )       Able to understand and use rate of perceived exertion (RPE) scale Yes       Intervention Provide education and explanation on how to use RPE scale       Expected Outcomes Short Term: Able to use RPE daily in rehab to express subjective intensity level;Long Term:  Able to use RPE to guide intensity level when exercising independently       Able to understand and use Dyspnea scale Yes       Intervention Provide education and explanation on how to use Dyspnea scale       Expected Outcomes Long Term: Able to use Dyspnea scale to guide intensity level when exercising  independently;Short Term: Able to use Dyspnea scale daily in rehab to express subjective sense of shortness of breath during exertion       Knowledge and understanding of Target Heart Rate Range (THRR) Yes       Intervention Provide education and explanation of THRR including how the numbers were predicted and where they are located for reference  Expected Outcomes Short Term: Able to state/look up THRR;Long Term: Able to use THRR to govern intensity when exercising independently;Short Term: Able to use daily as guideline for intensity in rehab       Able to check pulse independently Yes       Intervention Provide education and demonstration on how to check pulse in carotid and radial arteries.;Review the importance of being able to check your own pulse for safety during independent exercise       Expected Outcomes Short Term: Able to explain why pulse checking is important during independent exercise;Long Term: Able to check pulse independently and accurately       Understanding of Exercise Prescription Yes       Intervention Provide education, explanation, and written materials on patient's individual exercise prescription       Expected Outcomes Short Term: Able to explain program exercise prescription;Long Term: Able to explain home exercise prescription to exercise independently          Exercise Goals Re-Evaluation :  Exercise Goals Re-Evaluation     Row Name 04/29/24 0951 05/07/24 1624           Exercise Goal Re-Evaluation   Exercise Goals Review Increase Physical Activity;Able to understand and use rate of perceived exertion (RPE) scale;Knowledge and understanding of Target Heart Rate Range (THRR);Understanding of Exercise Prescription;Increase Strength and Stamina;Able to understand and use Dyspnea scale;Able to check pulse independently Increase Physical Activity;Understanding of Exercise Prescription;Increase Strength and Stamina      Comments Reviewed RPE and dyspnea scale,  THR and program prescription with pt today.  Pt voiced understanding and was given a copy of goals to take home. Wilmar is off to a good start in the program and completed his first week in this review. He worked at level 3 on the XR and T6 nustep. He had a workload on the treadmill of a speed of 2.4 mph with no incline. We will continue to monitor his progress in the program.      Expected Outcomes Short: Use RPE daily to regulate intensity. Long: Follow program prescription in THR. Short: Continue to follow current exercise prescription. Long: Continue exercise to improve strength and stamina.         Discharge Exercise Prescription (Final Exercise Prescription Changes):  Exercise Prescription Changes - 05/07/24 1600       Response to Exercise   Blood Pressure (Admit) 130/80    Blood Pressure (Exercise) 142/82    Blood Pressure (Exit) 124/80    Heart Rate (Admit) 78 bpm    Heart Rate (Exercise) 133 bpm    Heart Rate (Exit) 91 bpm    Rating of Perceived Exertion (Exercise) 15    Symptoms none    Comments 1st week of exercise sessions    Duration Progress to 30 minutes of  aerobic without signs/symptoms of physical distress    Intensity THRR unchanged      Progression   Progression Continue to progress workloads to maintain intensity without signs/symptoms of physical distress.    Average METs 2.94      Resistance Training   Training Prescription Yes    Weight 4 lb    Reps 10-15      Interval Training   Interval Training No      Treadmill   MPH 2.4    Grade 0    Minutes 15    METs 2.84      NuStep   Level 3   T6 nustep  Minutes 15    METs 4.5      REL-XR   Level 3    Minutes 15    METs 2.4      Oxygen   Maintain Oxygen Saturation 88% or higher          Nutrition:  Target Goals: Understanding of nutrition guidelines, daily intake of sodium 1500mg , cholesterol 200mg , calories 30% from fat and 7% or less from saturated fats, daily to have 5 or more servings  of fruits and vegetables.  Education: Nutrition 1 -Group instruction provided by verbal, written material, interactive activities, discussions, models, and posters to present general guidelines for heart healthy nutrition including macronutrients, label reading, and promoting whole foods over processed counterparts. Education serves as Pensions consultant of discussion of heart healthy eating for all. Written material provided at class time.    Education: Nutrition 2 -Group instruction provided by verbal, written material, interactive activities, discussions, models, and posters to present general guidelines for heart healthy nutrition including sodium, cholesterol, and saturated fat. Providing guidance of habit forming to improve blood pressure, cholesterol, and body weight. Written material provided at class time.     Biometrics:  Pre Biometrics - 04/28/24 0947       Pre Biometrics   Height 5' 8 (1.727 m)    Weight 254 lb 8 oz (115.4 kg)    Waist Circumference 48 inches    Hip Circumference 45.5 inches    Waist to Hip Ratio 1.05 %    BMI (Calculated) 38.71    Single Leg Stand 2.13 seconds           Nutrition Therapy Plan and Nutrition Goals:  Nutrition Therapy & Goals - 04/29/24 1516       Nutrition Therapy   Diet Cardiac, Low Na    Protein (specify units) 90    Fiber 30 grams    Whole Grain Foods 3 servings    Saturated Fats 15 max. grams    Fruits and Vegetables 5 servings/day    Sodium 2 grams      Personal Nutrition Goals   Nutrition Goal Read labels and reduce sodium intake to below 2300mg . Ideally 1500mg  per day.    Personal Goal #2 Reduce saturated fat, less than 12g per day. Replace bad fats for more heart healthy fats.    Personal Goal #3 Include more colorful produce, aim for 5-8 servings of fruits and veggies per day    Comments Kenry reports he cut out soda four or five months ago when he found out he might need stents. He said he was trying to be healthier.  Commended him on this change. Educated on how cutting back on soda is overall healthy but will have the biggest impact on his A1C, which was measured at 6.0% 1 month ago. Spoke with him about heart health and how saturated fats, sodium and cholesterol were the nutrients to monitor for heart health. Reviewed his food recall and discussed the high saturated fat, sodium and cholesterol in these foods he eats often. He admits it will be a lot to change to be more heart healthy. Encouraged him to work on it and focus on improving and not meeting perfect standards before the program ends. Provided guideline limits to work towards of less than 12g saturated fat, less than 1500mg  sodium, and less than 250mg  cholesterol. Reviewed mediterranean diet handout, educated on types of fats, sources, and how to read labels.      Intervention Plan   Intervention Prescribe, educate  and counsel regarding individualized specific dietary modifications aiming towards targeted core components such as weight, hypertension, lipid management, diabetes, heart failure and other comorbidities.;Nutrition handout(s) given to patient.    Expected Outcomes Short Term Goal: Understand basic principles of dietary content, such as calories, fat, sodium, cholesterol and nutrients.;Short Term Goal: A plan has been developed with personal nutrition goals set during dietitian appointment.;Long Term Goal: Adherence to prescribed nutrition plan.          Nutrition Assessments:  MEDIFICTS Score Key: >=70 Need to make dietary changes  40-70 Heart Healthy Diet <= 40 Therapeutic Level Cholesterol Diet  Flowsheet Row Cardiac Rehab from 05/04/2024 in [facility] Cardiac and Pulmonary Rehab  Picture Your Plate Total Score on Admission 36   Picture Your Plate Scores: <59 Unhealthy dietary pattern with much room for improvement. 41-50 Dietary pattern unlikely to meet recommendations for good health and room for improvement. 51-60 More healthful  dietary pattern, with some room for improvement.  >60 Healthy dietary pattern, although there may be some specific behaviors that could be improved.    Nutrition Goals Re-Evaluation:   Nutrition Goals Discharge (Final Nutrition Goals Re-Evaluation):   Psychosocial: Target Goals: Acknowledge presence or absence of significant depression and/or stress, maximize coping skills, provide positive support system. Participant is able to verbalize types and ability to use techniques and skills needed for reducing stress and depression.   Education: Stress, Anxiety, and Depression - Group verbal and visual presentation to define topics covered.  Reviews how body is impacted by stress, anxiety, and depression.  Also discusses healthy ways to reduce stress and to treat/manage anxiety and depression. Written material provided at class time. Flowsheet Row Cardiac Rehab from 08/27/2016 in [facility] Cardiac and Pulmonary Rehab  Date 08/01/16  Educator [facility]  Instruction Review Code (retired) 2- meets goals/outcomes    Education: Sleep Hygiene -Provides group verbal and written instruction about how sleep can affect your health.  Define sleep hygiene, discuss sleep cycles and impact of sleep habits. Review good sleep hygiene tips.   Initial Review & Psychosocial Screening:  Initial Psych Review & Screening - 04/23/24 1449       Initial Review   Current issues with None Identified      Family Dynamics   Good Support System? Yes   wife, son, mother   Comments Zebulun 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 He is now home with his wife and states he is feeling well. He reports that he has had decreased appetite and changes in the taste of food, but attributes this to medication changes, recent intubation, and the fact that he stopped smoking 5 months ago. Tra reports that he is ready to get back to his active lifestyle. He does admit to episodes of feeling down or increased  stress, however states that his wife and son offer a good support system and he enjoys walking his dog when he starts to feel this way. He is looking forward to starting the program again and verbalized understanding of the need to be vocal about how he was feeling and if anything was causing discomfort, specifically at his incision.      Barriers   Psychosocial barriers to participate in program The patient should benefit from training in stress management and relaxation.      Screening Interventions   Interventions Encouraged to exercise;To provide support and resources with identified psychosocial needs    Expected Outcomes Short Term goal: Utilizing psychosocial counselor, staff and physician  to assist with identification of specific Stressors or current issues interfering with healing process. Setting desired goal for each stressor or current issue identified.;Long Term Goal: Stressors or current issues are controlled or eliminated.;Short Term goal: Identification and review with participant of any Quality of Life or Depression concerns found by scoring the questionnaire.;Long Term goal: The participant improves quality of Life and PHQ9 Scores as seen by post scores and/or verbalization of changes          Quality of Life Scores:   Quality of Life - 05/04/24 1350       Quality of Life   Select Quality of Life      Quality of Life Scores   Health/Function Pre 26 %    Socioeconomic Pre 24.64 %    Psych/Spiritual Pre 26.57 %    Family Pre 30 %    GLOBAL Pre 26.43 %         Scores of 19 and below usually indicate a poorer quality of life in these areas.  A difference of  2-3 points is a clinically meaningful difference.  A difference of 2-3 points in the total score of the Quality of Life Index has been associated with significant improvement in overall quality of life, self-image, physical symptoms, and general health in studies assessing change in quality of life.  PHQ-9: Review  Flowsheet       04/28/2024 07/30/2016  Depression screen PHQ 2/9  Decreased Interest 1 0  Down, Depressed, Hopeless 1 0  PHQ - 2 Score 2 0  Altered sleeping 2 1  Tired, decreased energy 1 1  Change in appetite 1 1  Feeling bad or failure about yourself  0 0  Trouble concentrating 0 0  Moving slowly or fidgety/restless 0 0  Suicidal thoughts 0 0   PHQ-9 Score 6 3  Difficult doing work/chores Not difficult at all Not difficult at all    Details       Data saved with a previous flowsheet row definition        Interpretation of Total Score  Total Score Depression Severity:  1-4 = Minimal depression, 5-9 = Mild depression, 10-14 = Moderate depression, 15-19 = Moderately severe depression, 20-27 = Severe depression   Psychosocial Evaluation and Intervention:  Psychosocial Evaluation - 04/23/24 1452       Psychosocial Evaluation & Interventions   Interventions Encouraged to exercise with the program and follow exercise prescription;Relaxation education;Stress management education    Comments Taryll 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 He is now home with his wife and states he is feeling well. He reports that he has had decreased appetite and changes in the taste of food, but attributes this to medication changes, recent intubation, and the fact that he stopped smoking 5 months ago. Deyon reports that he is ready to get back to his active lifestyle. He does admit to episodes of feeling down or increased stress, however states that his wife and son offer a good support system and he enjoys walking his dog when he starts to feel this way. He is looking forward to starting the program again and verbalized understanding of the need to be vocal about how he was feeling and if anything was causing discomfort, specifically at his incision.    Expected Outcomes Short: Attend cardiac rehab for education and exercise. Long: Develop and maintain positive self  care habits.    Continue Psychosocial Services  Follow up required by staff  Psychosocial Re-Evaluation:   Psychosocial Discharge (Final Psychosocial Re-Evaluation):   Vocational Rehabilitation: Provide vocational rehab assistance to qualifying candidates.   Vocational Rehab Evaluation & Intervention:  Vocational Rehab - 04/23/24 1449       Initial Vocational Rehab Evaluation & Intervention   Assessment shows need for Vocational Rehabilitation No   patient is retired         Education: Education Goals: Education classes will be provided on a variety of topics geared toward better understanding of heart health and risk factor modification. Participant will state understanding/return demonstration of topics presented as noted by education test scores.  Learning Barriers/Preferences:  Learning Barriers/Preferences - 04/23/24 1449       Learning Barriers/Preferences   Learning Barriers None    Learning Preferences None          General Cardiac Education Topics:  AED/CPR: - Group verbal and written instruction with the use of models to demonstrate the basic use of the AED with the basic ABC's of resuscitation.   Test and Procedures: - Group verbal and visual presentation and models provide information about basic cardiac anatomy and function. Reviews the testing methods done to diagnose heart disease and the outcomes of the test results. Describes the treatment choices: Medical Management, Angioplasty, or Coronary Bypass Surgery for treating various heart conditions including Myocardial Infarction, Angina, Valve Disease, and Cardiac Arrhythmias. Written material provided at class time. Flowsheet Row Cardiac Rehab from 08/27/2016 in [facility] Cardiac and Pulmonary Rehab  Date 08/27/16  Educator MA  Instruction Review Code (retired) 2- meets goals/outcomes    Medication Safety: - Group verbal and visual instruction to review commonly prescribed medications for heart  and lung disease. Reviews the medication, class of the drug, and side effects. Includes the steps to properly store meds and maintain the prescription regimen. Written material provided at class time.   Intimacy: - Group verbal instruction through game format to discuss how heart and lung disease can affect sexual intimacy. Written material provided at class time.   Know Your Numbers and Heart Failure: - Group verbal and visual instruction to discuss disease risk factors for cardiac and pulmonary disease and treatment options.  Reviews associated critical values for Overweight/Obesity, Hypertension, Cholesterol, and Diabetes.  Discusses basics of heart failure: signs/symptoms and treatments.  Introduces Heart Failure Zone chart for action plan for heart failure. Written material provided at class time. Flowsheet Row Cardiac Rehab from 08/27/2016 in [facility] Cardiac and Pulmonary Rehab  Date 08/27/16  Educator MA  Instruction Review Code (retired) 2- meets goals/outcomes    Infection Prevention: - Provides verbal and written material to individual with discussion of infection control including proper hand washing and proper equipment cleaning during exercise session. Flowsheet Row Cardiac Rehab from 04/28/2024 in [facility] Cardiac and Pulmonary Rehab  Date 04/28/24  Educator NT  Instruction Review Code 1- Verbalizes Understanding    Falls Prevention: - Provides verbal and written material to individual with discussion of falls prevention and safety. Flowsheet Row Cardiac Rehab from 04/28/2024 in [facility] Cardiac and Pulmonary Rehab  Date 04/28/24  Educator NT  Instruction Review Code 1- Verbalizes Understanding    Other: -Provides group and verbal instruction on various topics (see comments)   Knowledge Questionnaire Score:  Knowledge Questionnaire Score - 05/04/24 1351       Knowledge Questionnaire Score   Pre Score 23/26          Core Components/Risk Factors/Patient Goals at  Admission:  Personal Goals and Risk Factors at Admission - 04/23/24  1447       Core Components/Risk Factors/Patient Goals on Admission    Weight Management Yes;Weight Loss    Intervention Weight Management: Develop a combined nutrition and exercise program designed to reach desired caloric intake, while maintaining appropriate intake of nutrient and fiber, sodium and fats, and appropriate energy expenditure required for the weight goal.;Weight Management: Provide education and appropriate resources to help participant work on and attain dietary goals.;Weight Management/Obesity: Establish reasonable short term and long term weight goals.;Obesity: Provide education and appropriate resources to help participant work on and attain dietary goals.    Goal Weight: Long Term 225 lb (102.1 kg)    Expected Outcomes Short Term: Continue to assess and modify interventions until short term weight is achieved;Weight Loss: Understanding of general recommendations for a balanced deficit meal plan, which promotes 1-2 lb weight loss per week and includes a negative energy balance of [phone] kcal/d;Long Term: Adherence to nutrition and physical activity/exercise program aimed toward attainment of established weight goal;Understanding recommendations for meals to include 15-35% energy as protein, 25-35% energy from fat, 35-60% energy from carbohydrates, less than 200mg  of dietary cholesterol, 20-35 gm of total fiber daily;Understanding of distribution of calorie intake throughout the day with the consumption of 4-5 meals/snacks    Hypertension Yes    Intervention Provide education on lifestyle modifcations including regular physical activity/exercise, weight management, moderate sodium restriction and increased consumption of fresh fruit, vegetables, and low fat dairy, alcohol moderation, and smoking cessation.;Monitor prescription use compliance.    Expected Outcomes Short Term: Continued assessment and intervention until  BP is < 140/41mm HG in hypertensive participants. < 130/49mm HG in hypertensive participants with diabetes, heart failure or chronic kidney disease.;Long Term: Maintenance of blood pressure at goal levels.    Lipids Yes    Intervention Provide education and support for participant on nutrition & aerobic/resistive exercise along with prescribed medications to achieve LDL 70mg , HDL >40mg .    Expected Outcomes Short Term: Participant states understanding of desired cholesterol values and is compliant with medications prescribed. Participant is following exercise prescription and nutrition guidelines.;Long Term: Cholesterol controlled with medications as prescribed, with individualized exercise RX and with personalized nutrition plan. Value goals: LDL < 70mg , HDL > 40 mg.          Education:Diabetes - Individual verbal and written instruction to review signs/symptoms of diabetes, desired ranges of glucose level fasting, after meals and with exercise. Acknowledge that pre and post exercise glucose checks will be done for 3 sessions at entry of program.   Core Components/Risk Factors/Patient Goals Review:    Core Components/Risk Factors/Patient Goals at Discharge (Final Review):    ITP Comments:  ITP Comments     Row Name 04/23/24 1444 04/28/24 0943 04/29/24 0951 05/13/24 0937     ITP Comments Initial phone call completed. Diagnosis can be found in [facility] 03/16/2024. EP Orientation scheduled for Tuesday 10/7 at 8:00. Completed and gym orientation for cardiac rehab. Initial ITP created and sent for review to Dr. Oneil Pinal, Medical Director. First full day of exercise!  Patient was oriented to gym and equipment including functions, settings, policies, and procedures.  Patient's individual exercise prescription and treatment plan were reviewed.  All starting workloads were established based on the results of the 6 minute walk test done at initial orientation visit.  The plan for exercise  progression was also introduced and progression will be customized based on patient's performance and goals. 30 Day review completed. Medical Director ITP review done, changes made  as directed, and signed approval by Wellsite geologist.       Comments: 30 day review

## 2024-05-13 NOTE — Progress Notes (Signed)
 Daily Session Note  Patient Details  Name: Shaun Anderson MRN: [id number] Date of Birth: [birth year] Referring Provider:   Flowsheet Row Cardiac Rehab from 04/28/2024 in [facility] Cardiac and Pulmonary Rehab  Referring Provider Dr. Cara Lovelace, MD    Encounter Date: 05/13/2024  Check In:  Session Check In - 05/13/24 0737       Check-In   Supervising physician immediately available to respond to emergencies See telemetry face sheet for immediately available ER MD    Location [facility]-Cardiac & Pulmonary Rehab    Staff Present Burnard Davenport RN,BSN,MPA;Joseph Rolinda RCP,RRT,BSRT;Noah Tickle, [place], Exercise Physiologist    Virtual Visit No    Medication changes reported     No    Fall or balance concerns reported    No    Warm-up and Cool-down Performed on first and last piece of equipment    Resistance Training Performed Yes    VAD Patient? No    PAD/SET Patient? No      Pain Assessment   Currently in Pain? No/denies             Social History   Tobacco Use  Smoking Status Former   Current packs/day: 1.00   Average packs/day: 1 pack/day for 24.0 years (24.0 ttl pk-yrs)   Types: Cigarettes  Smokeless Tobacco Never    Goals Met:  Independence with exercise equipment Exercise tolerated well No report of concerns or symptoms today Strength training completed today  Goals Unmet:  Not Applicable  Comments: Pt able to follow exercise prescription today without complaint.  Will continue to monitor for progression.    Dr. Oneil Pinal is Medical Director for [facility] Cardiac Rehabilitation.  Dr. Fuad Aleskerov is Medical Director for [facility] Pulmonary Rehabilitation.

## 2024-05-15 ENCOUNTER — Encounter: Admitting: Emergency Medicine

## 2024-05-15 DIAGNOSIS — Z951 Presence of aortocoronary bypass graft: Secondary | ICD-10-CM | POA: Diagnosis not present

## 2024-05-15 NOTE — Progress Notes (Signed)
 Daily Session Note  Patient Details  Name: Shaun Anderson MRN: [id number] Date of Birth: [birth year] Referring Provider:   Flowsheet Row Cardiac Rehab from 04/28/2024 in [facility] Cardiac and Pulmonary Rehab  Referring Provider Dr. Cara Lovelace, MD    Encounter Date: 05/15/2024  Check In:  Session Check In - 05/15/24 0934       Check-In   Supervising physician immediately available to respond to emergencies See telemetry face sheet for immediately available ER MD    Location [facility]-Cardiac & Pulmonary Rehab    Staff Present Leita Franks RN,BSN;Joseph [facility] BS, Exercise Physiologist;Noah Tickle, BS, Exercise Physiologist    Virtual Visit No    Medication changes reported     No    Fall or balance concerns reported    No    Warm-up and Cool-down Performed on first and last piece of equipment    Resistance Training Performed Yes    VAD Patient? No    PAD/SET Patient? No      Pain Assessment   Currently in Pain? No/denies             Social History   Tobacco Use  Smoking Status Former   Current packs/day: 1.00   Average packs/day: 1 pack/day for 24.0 years (24.0 ttl pk-yrs)   Types: Cigarettes  Smokeless Tobacco Never    Goals Met:  Independence with exercise equipment Exercise tolerated well No report of concerns or symptoms today Strength training completed today  Goals Unmet:  Not Applicable  Comments: Pt able to follow exercise prescription today without complaint.  Will continue to monitor for progression.    Dr. Oneil Pinal is Medical Director for [facility] Cardiac Rehabilitation.  Dr. Fuad Aleskerov is Medical Director for [facility] Pulmonary Rehabilitation.

## 2024-05-18 ENCOUNTER — Ambulatory Visit
Admission: RE | Admit: 2024-05-18 | Discharge: 2024-05-18 | Disposition: A | Discharge status: Home / Self Care | Attending: Cardiology | Admitting: Cardiology

## 2024-05-18 ENCOUNTER — Ambulatory Visit: Admission: RE | Admit: 2024-05-18 | Source: Home / Self Care | Admitting: Cardiology

## 2024-05-18 ENCOUNTER — Other Ambulatory Visit: Payer: Self-pay | Admitting: Student

## 2024-05-18 ENCOUNTER — Encounter

## 2024-05-18 ENCOUNTER — Encounter
Admission: RE | Disposition: A | Discharge status: Home / Self Care | Payer: Self-pay | Source: Home / Self Care | Attending: Cardiology

## 2024-05-18 ENCOUNTER — Encounter: Payer: Self-pay | Admitting: Anesthesiology

## 2024-05-18 ENCOUNTER — Other Ambulatory Visit: Payer: Self-pay

## 2024-05-18 DIAGNOSIS — Z539 Procedure and treatment not carried out, unspecified reason: Secondary | ICD-10-CM | POA: Insufficient documentation

## 2024-05-18 DIAGNOSIS — I4891 Unspecified atrial fibrillation: Secondary | ICD-10-CM | POA: Diagnosis present

## 2024-05-18 DIAGNOSIS — Z951 Presence of aortocoronary bypass graft: Secondary | ICD-10-CM | POA: Diagnosis not present

## 2024-05-18 DIAGNOSIS — I4819 Other persistent atrial fibrillation: Secondary | ICD-10-CM

## 2024-05-18 DIAGNOSIS — Z7901 Long term (current) use of anticoagulants: Secondary | ICD-10-CM | POA: Diagnosis not present

## 2024-05-18 HISTORY — PX: TEE WITHOUT CARDIOVERSION: SHX5443

## 2024-05-18 HISTORY — PX: CARDIOVERSION: SHX1299

## 2024-05-18 SURGERY — ECHOCARDIOGRAM, TRANSESOPHAGEAL
Anesthesia: General

## 2024-05-18 MED ORDER — LIDOCAINE HCL (PF) 2 % IJ SOLN
INTRAMUSCULAR | Status: AC
  Filled 2024-05-18: qty 5

## 2024-05-18 MED ORDER — FENTANYL CITRATE (PF) 100 MCG/2ML IJ SOLN
INTRAMUSCULAR | Status: AC
  Filled 2024-05-18: qty 2

## 2024-05-18 MED ORDER — PHENYLEPHRINE 80 MCG/ML (10ML) SYRINGE FOR IV PUSH (FOR BLOOD PRESSURE SUPPORT)
PREFILLED_SYRINGE | INTRAVENOUS | Status: AC
  Filled 2024-05-18: qty 10

## 2024-05-18 MED ORDER — SODIUM CHLORIDE 0.9 % IV SOLN: INTRAVENOUS | Status: DC

## 2024-05-18 MED ORDER — EPHEDRINE 5 MG/ML INJ
INTRAVENOUS | Status: AC
  Filled 2024-05-18: qty 5

## 2024-05-18 MED ORDER — PROPOFOL 1000 MG/100ML IV EMUL
INTRAVENOUS | Status: AC
  Filled 2024-05-18: qty 100

## 2024-05-18 NOTE — Progress Notes (Signed)
 Pt stated he has not taken his Eliquis  since Friday. Notified Dr Wilburn and CHudson, awaiting response.

## 2024-05-18 NOTE — Progress Notes (Signed)
 Procedure cancelled today per MD. Office to call patient to reschedule

## 2024-05-18 NOTE — Progress Notes (Signed)
 Daily Session Note  Patient Details  Name: Shaun Anderson MRN: [id number] Date of Birth: 04-08-1961 Referring Provider:   Flowsheet Row Cardiac Rehab from 04/28/2024 in [facility] Cardiac and Pulmonary Rehab  Referring Provider Dr. Cara Lovelace, MD    Encounter Date: 05/18/2024  Check In:  Session Check In - 05/18/24 1001       Check-In   Supervising physician immediately available to respond to emergencies See telemetry face sheet for immediately available ER MD    Location [facility]-Cardiac & Pulmonary Rehab    Staff Present Burnard Davenport RN,BSN,MPA;Joseph [facility] RCP,RRT,BSRT;Maxon Burnell BS, Exercise Physiologist;Laura Cates RN,BSN;Sergey Ishler Dyane BS, ACSM CEP, Exercise Physiologist    Virtual Visit No    Medication changes reported     No    Fall or balance concerns reported    No    Warm-up and Cool-down Performed on first and last piece of equipment    Resistance Training Performed Yes    VAD Patient? No    PAD/SET Patient? No      Pain Assessment   Currently in Pain? No/denies             Social History   Tobacco Use  Smoking Status Former   Current packs/day: 1.00   Average packs/day: 1 pack/day for 24.0 years (24.0 ttl pk-yrs)   Types: Cigarettes  Smokeless Tobacco Never    Goals Met:  Independence with exercise equipment Exercise tolerated well No report of concerns or symptoms today Strength training completed today  Goals Unmet:  Not Applicable  Comments: Pt able to follow exercise prescription today without complaint.  Will continue to monitor for progression.    Dr. Oneil Pinal is Medical Director for [facility] Cardiac Rehabilitation.  Dr. Fuad Aleskerov is Medical Director for [facility] Pulmonary Rehabilitation.

## 2024-05-19 ENCOUNTER — Encounter: Payer: Self-pay | Admitting: Cardiology

## 2024-05-20 ENCOUNTER — Encounter: Admitting: Emergency Medicine

## 2024-05-20 DIAGNOSIS — Z951 Presence of aortocoronary bypass graft: Secondary | ICD-10-CM

## 2024-05-20 NOTE — Progress Notes (Signed)
 Daily Session Note  Patient Details  Name: Shaun Anderson MRN: [id number] Date of Birth: 20-Jun-1961 Referring Provider:   Flowsheet Row Cardiac Rehab from 04/28/2024 in [facility] Cardiac and Pulmonary Rehab  Referring Provider Dr. Cara Lovelace, MD    Encounter Date: 05/20/2024  Check In:  Session Check In - 05/20/24 0943       Check-In   Supervising physician immediately available to respond to emergencies See telemetry face sheet for immediately available ER MD    Location [facility]-Cardiac & Pulmonary Rehab    Staff Present Devaughn Jaeger, BS, Exercise Physiologist;Margaret Best, MS, Exercise Physiologist;Sapphira Harjo RN,BSN;Joseph Rolinda RCP,RRT,BSRT    Virtual Visit No    Medication changes reported     No    Fall or balance concerns reported    No    Warm-up and Cool-down Performed on first and last piece of equipment    Resistance Training Performed Yes    VAD Patient? No    PAD/SET Patient? No      Pain Assessment   Currently in Pain? No/denies             Social History   Tobacco Use  Smoking Status Former   Current packs/day: 1.00   Average packs/day: 1 pack/day for 24.0 years (24.0 ttl pk-yrs)   Types: Cigarettes  Smokeless Tobacco Never    Goals Met:  Independence with exercise equipment Exercise tolerated well No report of concerns or symptoms today Strength training completed today  Goals Unmet:  Not Applicable  Comments: Pt able to follow exercise prescription today without complaint.  Will continue to monitor for progression.    Dr. Oneil Pinal is Medical Director for [facility] Cardiac Rehabilitation.  Dr. Fuad Aleskerov is Medical Director for [facility] Pulmonary Rehabilitation.

## 2024-05-22 ENCOUNTER — Encounter: Admitting: Emergency Medicine

## 2024-05-22 DIAGNOSIS — Z951 Presence of aortocoronary bypass graft: Secondary | ICD-10-CM

## 2024-05-22 NOTE — Progress Notes (Signed)
 Daily Session Note  Patient Details  Name: Shaun Anderson MRN: [id number] Date of Birth: August 22, 1960 Referring Provider:   Flowsheet Row Cardiac Rehab from 04/28/2024 in [facility] Cardiac and Pulmonary Rehab  Referring Provider Dr. Cara Lovelace, MD    Encounter Date: 05/22/2024  Check In:  Session Check In - 05/22/24 0744       Check-In   Supervising physician immediately available to respond to emergencies See telemetry face sheet for immediately available ER MD    Location [facility]-Cardiac & Pulmonary Rehab    Staff Present Devaughn Jaeger, BS, Exercise Physiologist;Hether Anselmo RN,BSN;Joseph Rolinda RCP,RRT,BSRT    Virtual Visit No    Medication changes reported     No    Fall or balance concerns reported    No    Warm-up and Cool-down Performed on first and last piece of equipment    Resistance Training Performed Yes    VAD Patient? No    PAD/SET Patient? No      Pain Assessment   Currently in Pain? No/denies             Social History   Tobacco Use  Smoking Status Former   Current packs/day: 1.00   Average packs/day: 1 pack/day for 24.0 years (24.0 ttl pk-yrs)   Types: Cigarettes  Smokeless Tobacco Never    Goals Met:  Independence with exercise equipment Exercise tolerated well No report of concerns or symptoms today Strength training completed today  Goals Unmet:  Not Applicable  Comments: Pt able to follow exercise prescription today without complaint.  Will continue to monitor for progression.    Dr. Oneil Pinal is Medical Director for [facility] Cardiac Rehabilitation.  Dr. Fuad Aleskerov is Medical Director for [facility] Pulmonary Rehabilitation.

## 2024-05-25 ENCOUNTER — Encounter: Attending: Internal Medicine | Admitting: *Deleted

## 2024-05-25 DIAGNOSIS — Z951 Presence of aortocoronary bypass graft: Secondary | ICD-10-CM | POA: Diagnosis not present

## 2024-05-25 DIAGNOSIS — Z48812 Encounter for surgical aftercare following surgery on the circulatory system: Secondary | ICD-10-CM | POA: Insufficient documentation

## 2024-05-25 MED ORDER — SODIUM CHLORIDE 0.9 % IV SOLN: INTRAVENOUS | Status: DC

## 2024-05-25 NOTE — Progress Notes (Signed)
 Daily Session Note  Patient Details  Name: Shaun Anderson MRN: [id number] Date of Birth: 28-Jun-1961 Referring Provider:   Flowsheet Row Cardiac Rehab from 04/28/2024 in [facility] Cardiac and Pulmonary Rehab  Referring Provider Dr. Cara Lovelace, MD    Encounter Date: 05/25/2024  Check In:  Session Check In - 05/25/24 0939       Check-In   Supervising physician immediately available to respond to emergencies See telemetry face sheet for immediately available ER MD    Location [facility]-Cardiac & Pulmonary Rehab    Staff Present Othel Durand, RN, BSN, CCRP;Laura Cates RN,BSN;Joseph [employer] BS, ACSM CEP, Exercise Physiologist    Medication changes reported     Yes    Comments no jardiance today per MD    Fall or balance concerns reported    No    Warm-up and Cool-down Performed on first and last piece of equipment    Resistance Training Performed Yes    VAD Patient? No    PAD/SET Patient? No      Pain Assessment   Currently in Pain? No/denies             Social History   Tobacco Use  Smoking Status Former   Current packs/day: 1.00   Average packs/day: 1 pack/day for 24.0 years (24.0 ttl pk-yrs)   Types: Cigarettes  Smokeless Tobacco Never    Goals Met:  Independence with exercise equipment Exercise tolerated well No report of concerns or symptoms today  Goals Unmet:  Not Applicable  Comments: Pt able to follow exercise prescription today without complaint.  Will continue to monitor for progression.    Dr. Oneil Pinal is Medical Director for [facility] Cardiac Rehabilitation.  Dr. Fuad Aleskerov is Medical Director for [facility] Pulmonary Rehabilitation.

## 2024-05-26 ENCOUNTER — Ambulatory Visit
Admission: RE | Admit: 2024-05-26 | Discharge: 2024-05-26 | Disposition: A | Discharge status: Home / Self Care | Attending: Cardiology | Admitting: Cardiology

## 2024-05-26 ENCOUNTER — Encounter: Payer: Self-pay | Admitting: Cardiology

## 2024-05-26 ENCOUNTER — Ambulatory Visit: Admitting: Anesthesiology

## 2024-05-26 ENCOUNTER — Ambulatory Visit
Admission: RE | Admit: 2024-05-26 | Discharge: 2024-05-26 | Disposition: A | Source: Home / Self Care | Attending: Student | Admitting: Cardiology

## 2024-05-26 ENCOUNTER — Other Ambulatory Visit: Payer: Self-pay

## 2024-05-26 ENCOUNTER — Encounter
Admission: RE | Disposition: A | Discharge status: Home / Self Care | Payer: Self-pay | Source: Home / Self Care | Attending: Cardiology

## 2024-05-26 DIAGNOSIS — Z79899 Other long term (current) drug therapy: Secondary | ICD-10-CM | POA: Insufficient documentation

## 2024-05-26 DIAGNOSIS — I252 Old myocardial infarction: Secondary | ICD-10-CM | POA: Diagnosis not present

## 2024-05-26 DIAGNOSIS — G4733 Obstructive sleep apnea (adult) (pediatric): Secondary | ICD-10-CM | POA: Diagnosis not present

## 2024-05-26 DIAGNOSIS — E785 Hyperlipidemia, unspecified: Secondary | ICD-10-CM | POA: Insufficient documentation

## 2024-05-26 DIAGNOSIS — Z8673 Personal history of transient ischemic attack (TIA), and cerebral infarction without residual deficits: Secondary | ICD-10-CM | POA: Insufficient documentation

## 2024-05-26 DIAGNOSIS — Z7982 Long term (current) use of aspirin: Secondary | ICD-10-CM | POA: Insufficient documentation

## 2024-05-26 DIAGNOSIS — J449 Chronic obstructive pulmonary disease, unspecified: Secondary | ICD-10-CM | POA: Diagnosis not present

## 2024-05-26 DIAGNOSIS — I110 Hypertensive heart disease with heart failure: Secondary | ICD-10-CM | POA: Insufficient documentation

## 2024-05-26 DIAGNOSIS — Z955 Presence of coronary angioplasty implant and graft: Secondary | ICD-10-CM | POA: Insufficient documentation

## 2024-05-26 DIAGNOSIS — I4891 Unspecified atrial fibrillation: Secondary | ICD-10-CM | POA: Diagnosis present

## 2024-05-26 DIAGNOSIS — I4819 Other persistent atrial fibrillation: Secondary | ICD-10-CM | POA: Insufficient documentation

## 2024-05-26 DIAGNOSIS — Z87891 Personal history of nicotine dependence: Secondary | ICD-10-CM | POA: Insufficient documentation

## 2024-05-26 DIAGNOSIS — I2510 Atherosclerotic heart disease of native coronary artery without angina pectoris: Secondary | ICD-10-CM | POA: Insufficient documentation

## 2024-05-26 DIAGNOSIS — Z86718 Personal history of other venous thrombosis and embolism: Secondary | ICD-10-CM | POA: Insufficient documentation

## 2024-05-26 DIAGNOSIS — I5022 Chronic systolic (congestive) heart failure: Secondary | ICD-10-CM | POA: Diagnosis not present

## 2024-05-26 DIAGNOSIS — I340 Nonrheumatic mitral (valve) insufficiency: Secondary | ICD-10-CM | POA: Insufficient documentation

## 2024-05-26 HISTORY — PX: TEE WITHOUT CARDIOVERSION: SHX5443

## 2024-05-26 HISTORY — PX: CARDIOVERSION: SHX1299

## 2024-05-26 SURGERY — ECHOCARDIOGRAM, TRANSESOPHAGEAL
Anesthesia: General

## 2024-05-26 MED ORDER — BUTAMBEN-TETRACAINE-BENZOCAINE 2-2-14 % EX AERO
INHALATION_SPRAY | CUTANEOUS | Status: AC
  Filled 2024-05-26: qty 5

## 2024-05-26 MED ORDER — LIDOCAINE VISCOUS HCL 2 % MT SOLN
OROMUCOSAL | Status: AC
  Filled 2024-05-26: qty 15

## 2024-05-26 MED ORDER — PROPOFOL 10 MG/ML IV BOLUS
INTRAVENOUS | Status: DC | PRN
  Administered 2024-05-26: 30 mg via INTRAVENOUS
  Administered 2024-05-26: 40 mg via INTRAVENOUS
  Administered 2024-05-26: 30 mg via INTRAVENOUS
  Administered 2024-05-26: 80 mg via INTRAVENOUS

## 2024-05-26 NOTE — Anesthesia Postprocedure Evaluation (Signed)
 Anesthesia Post Note  Patient: Shaun Anderson  Procedure(s) Performed: ECHOCARDIOGRAM, TRANSESOPHAGEAL CARDIOVERSION  Patient location during evaluation: PACU Anesthesia Type: General Level of consciousness: awake and alert Pain management: pain level controlled Vital Signs Assessment: post-procedure vital signs reviewed and stable Respiratory status: spontaneous breathing, nonlabored ventilation, respiratory function stable and patient connected to nasal cannula oxygen Cardiovascular status: blood pressure returned to baseline and stable Postop Assessment: no apparent nausea or vomiting Anesthetic complications: no   No notable events documented.   Last Vitals:  Vitals:   05/26/24 1319 05/26/24 1320  BP:    Pulse: 68 67  Resp: (!) 33 (!) 21  Temp:    SpO2: 98% 99%    Last Pain:  Vitals:   05/26/24 1220  TempSrc: Temporal  PainSc: 0-No pain                 Lynwood KANDICE Clause

## 2024-05-26 NOTE — Procedures (Signed)
 Electrical Cardioversion Procedure Note  Indication: Atrial Fibrillation  Procedure Details: Consent: Indication, Risk/benefits of procedure as well as the alternatives explained to patient and informed consent obtained. Time out performed. Verified patient identification, verified procedure, verified correct patient position, special equipment/implants available, medications/allergies/relevent history reviewed, required imaging and test results reviewed.  Deep sedation was provided by anesthesia with propofol . Patient was delivered with 200 Joules of electricity X 1 with success to Sinus rhythm. Patient tolerated the procedure well. No immediate complication noted.   Successful cardioversion  Keller Paterson, MD [facility] Cardiology- [facility]

## 2024-05-26 NOTE — Anesthesia Preprocedure Evaluation (Signed)
 Anesthesia Evaluation  Patient identified by MRN, date of birth, ID band Patient awake    Reviewed: Allergy & Precautions, H&P , NPO status , Patient's Chart, lab work & pertinent test results, reviewed documented beta blocker date and time   Airway Mallampati: II   Neck ROM: full    Dental  (+) Poor Dentition   Pulmonary sleep apnea , COPD, former smoker   Pulmonary exam normal        Cardiovascular Exercise Tolerance: Poor hypertension, + angina with exertion + CAD and + Past MI  Normal cardiovascular exam Rhythm:regular Rate:Normal     Neuro/Psych CVA  negative psych ROS   GI/Hepatic negative GI ROS, Neg liver ROS,,,  Endo/Other  negative endocrine ROS    Renal/GU negative Renal ROS  negative genitourinary   Musculoskeletal   Abdominal   Peds  Hematology negative hematology ROS (+)   Anesthesia Other Findings Past Medical History: No date: A-fib (HCC) No date: CAD (coronary artery disease) No date: DVT (deep venous thrombosis) (HCC) No date: Hyperlipidemia No date: Hypertension No date: Sleep apnea No date: STEMI (ST elevation myocardial infarction) (HCC) No date: Stroke [facility]) Past Surgical History: 04/26/2016: CARDIAC CATHETERIZATION; Left     Comment:  Procedure: Left Heart Cath and Coronary Angiography;                Surgeon: Wolm JINNY Rhyme, MD;  Location: [facility] INVASIVE               CV LAB;  Service: Cardiovascular;  Laterality: Left; 05/18/2024: CARDIOVERSION; N/A     Comment:  Procedure: CARDIOVERSION;  Surgeon: Wilburn Keller BROCKS,               MD;  Location: [facility] ORS;  Service: Cardiovascular;                Laterality: N/A; 09/29/2018: COLONOSCOPY WITH PROPOFOL ; N/A     Comment:  Procedure: COLONOSCOPY WITH PROPOFOL ;  Surgeon: Viktoria Lamar DASEN, MD;  Location: [facility] ENDOSCOPY;  Service:               Endoscopy;  Laterality: N/A; 09/09/2023: COLONOSCOPY WITH PROPOFOL ; N/A      Comment:  Procedure: COLONOSCOPY WITH PROPOFOL ;  Surgeon: Onita Elspeth Sharper, DO;  Location: [facility] ENDOSCOPY;  Service:               Gastroenterology;  Laterality: N/A; No date: CORONARY ARTERY BYPASS GRAFT 02/26/2024: LEFT HEART CATH AND CORONARY ANGIOGRAPHY; Left     Comment:  Procedure: LEFT HEART CATH AND CORONARY ANGIOGRAPHY;                Surgeon: Katina Albright, MD;  Location: [facility] INVASIVE CV               LAB;  Service: Cardiovascular;  Laterality: Left; No date: NO PAST SURGERIES 09/09/2023: POLYPECTOMY     Comment:  Procedure: POLYPECTOMY;  Surgeon: Onita Elspeth Sharper,              DO;  Location: [facility] ENDOSCOPY;  Service:               Gastroenterology;; 09/09/2023: ROBLEY LIFTING INJECTION     Comment:  Procedure: SUBMUCOSAL LIFTING INJECTION;  Surgeon:               Onita Elspeth Sharper, DO;  Location:  [facility] ENDOSCOPY;                Service: Gastroenterology;; 09/09/2023: SUBMUCOSAL TATTOO INJECTION     Comment:  Procedure: SUBMUCOSAL TATTOO INJECTION;  Surgeon: Onita Elspeth Sharper, DO;  Location: [facility] ENDOSCOPY;  Service:               Gastroenterology;; 05/18/2024: TEE WITHOUT CARDIOVERSION; N/A     Comment:  Procedure: ECHOCARDIOGRAM, TRANSESOPHAGEAL;  Surgeon:               Alluri, Keller BROCKS, MD;  Location: [facility] ORS;  Service:               Cardiovascular;  Laterality: N/A; BMI    Body Mass Index: 37.71 kg/m     Reproductive/Obstetrics negative OB ROS                              Anesthesia Physical Anesthesia Plan  ASA: 4  Anesthesia Plan: General   Post-op Pain Management:    Induction:   PONV Risk Score and Plan:   Airway Management Planned:   Additional Equipment:   Intra-op Plan:   Post-operative Plan:   Informed Consent: I have reviewed the patients History and Physical, chart, labs and discussed the procedure including the risks, benefits and alternatives for the proposed  anesthesia with the patient or authorized representative who has indicated his/her understanding and acceptance.     Dental Advisory Given  Plan Discussed with: CRNA  Anesthesia Plan Comments:         Anesthesia Quick Evaluation

## 2024-05-26 NOTE — Transfer of Care (Signed)
 Immediate Anesthesia Transfer of Care Note  Patient: Shaun Anderson  Procedure(s) Performed: ECHOCARDIOGRAM, TRANSESOPHAGEAL CARDIOVERSION  Patient Location: PACU and Nursing Unit  Anesthesia Type:General  Level of Consciousness: drowsy and patient cooperative  Airway & Oxygen Therapy: Patient Spontanous Breathing and Patient connected to nasal cannula oxygen  Post-op Assessment: Report given to RN and Post -op Vital signs reviewed and stable  Post vital signs: Reviewed and stable  Last Vitals:  Vitals Value Taken Time  BP 129/88 05/26/24 13:18  Temp    Pulse 67 05/26/24 13:20  Resp 21 05/26/24 13:20  SpO2 99 % 05/26/24 13:20    Last Pain:  Vitals:   05/26/24 1220  TempSrc: Temporal  PainSc: 0-No pain         Complications: No notable events documented.

## 2024-05-27 ENCOUNTER — Encounter: Payer: Self-pay | Admitting: Cardiology

## 2024-05-27 ENCOUNTER — Encounter

## 2024-05-29 ENCOUNTER — Encounter: Admitting: Emergency Medicine

## 2024-05-29 DIAGNOSIS — Z48812 Encounter for surgical aftercare following surgery on the circulatory system: Secondary | ICD-10-CM | POA: Diagnosis not present

## 2024-05-29 DIAGNOSIS — Z951 Presence of aortocoronary bypass graft: Secondary | ICD-10-CM

## 2024-05-29 NOTE — Progress Notes (Signed)
 Daily Session Note  Patient Details  Name: Shaun Anderson MRN: [id number] Date of Birth: 1960/08/24 Referring Provider:   Flowsheet Row Cardiac Rehab from 04/28/2024 in [facility] Cardiac and Pulmonary Rehab  Referring Provider Dr. Cara Lovelace, MD    Encounter Date: 05/29/2024  Check In:  Session Check In - 05/29/24 0921       Check-In   Supervising physician immediately available to respond to emergencies See telemetry face sheet for immediately available ER MD    Location [facility]-Cardiac & Pulmonary Rehab    Staff Present Leita Franks RN,BSN;[facility] BS, Exercise Physiologist;Noah Tickle, BS, Exercise Physiologist    Virtual Visit No    Medication changes reported     No    Fall or balance concerns reported    No    Warm-up and Cool-down Performed on first and last piece of equipment    Resistance Training Performed Yes    VAD Patient? No    PAD/SET Patient? No      Pain Assessment   Currently in Pain? No/denies             Social History   Tobacco Use  Smoking Status Former   Current packs/day: 1.00   Average packs/day: 1 pack/day for 24.0 years (24.0 ttl pk-yrs)   Types: Cigarettes  Smokeless Tobacco Never    Goals Met:  Independence with exercise equipment Exercise tolerated well No report of concerns or symptoms today Strength training completed today  Goals Unmet:  Not Applicable  Comments: Pt able to follow exercise prescription today without complaint.  Will continue to monitor for progression.    Dr. Oneil Pinal is Medical Director for [facility] Cardiac Rehabilitation.  Dr. Fuad Aleskerov is Medical Director for [facility] Pulmonary Rehabilitation.

## 2024-06-01 ENCOUNTER — Encounter

## 2024-06-01 DIAGNOSIS — Z48812 Encounter for surgical aftercare following surgery on the circulatory system: Secondary | ICD-10-CM | POA: Diagnosis not present

## 2024-06-01 DIAGNOSIS — Z951 Presence of aortocoronary bypass graft: Secondary | ICD-10-CM

## 2024-06-01 NOTE — Progress Notes (Signed)
 Daily Session Note  Patient Details  Name: Shaun Anderson MRN: [id number] Date of Birth: 02/08/1961 Referring Provider:   Flowsheet Row Cardiac Rehab from 04/28/2024 in [facility] Cardiac and Pulmonary Rehab  Referring Provider Dr. Cara Lovelace, MD    Encounter Date: 06/01/2024  Check In:  Session Check In - 06/01/24 0920       Check-In   Supervising physician immediately available to respond to emergencies See telemetry face sheet for immediately available ER MD    Location [facility]-Cardiac & Pulmonary Rehab    Staff Present Burnard Hint BS, ACSM CEP, Exercise Physiologist;Maxon Burnell HECKLE, Exercise Physiologist;Joseph Rolinda NORWOOD HARMAN [name], RN, DNP, NE-BC;Gitel Beste RN,BSN,MPA    Virtual Visit No    Medication changes reported     No    Fall or balance concerns reported    No    Warm-up and Cool-down Performed on first and last piece of equipment    Resistance Training Performed Yes    VAD Patient? No    PAD/SET Patient? No      Pain Assessment   Currently in Pain? No/denies             Social History   Tobacco Use  Smoking Status Former   Current packs/day: 1.00   Average packs/day: 1 pack/day for 24.0 years (24.0 ttl pk-yrs)   Types: Cigarettes  Smokeless Tobacco Never    Goals Met:  Independence with exercise equipment Exercise tolerated well No report of concerns or symptoms today Strength training completed today  Goals Unmet:  Not Applicable  Comments: Pt able to follow exercise prescription today without complaint.  Will continue to monitor for progression.    Dr. Oneil Pinal is Medical Director for [facility] Cardiac Rehabilitation.  Dr. Fuad Aleskerov is Medical Director for [facility] Pulmonary Rehabilitation.

## 2024-06-03 ENCOUNTER — Encounter: Admitting: Emergency Medicine

## 2024-06-03 DIAGNOSIS — Z48812 Encounter for surgical aftercare following surgery on the circulatory system: Secondary | ICD-10-CM | POA: Diagnosis not present

## 2024-06-03 DIAGNOSIS — Z951 Presence of aortocoronary bypass graft: Secondary | ICD-10-CM

## 2024-06-03 NOTE — Progress Notes (Signed)
 Daily Session Note  Patient Details  Name: Shaun Anderson MRN: [id number] Date of Birth: 1961-04-02 Referring Provider:   Flowsheet Row Cardiac Rehab from 04/28/2024 in [facility] Cardiac and Pulmonary Rehab  Referring Provider Dr. Cara Lovelace, MD    Encounter Date: 06/03/2024  Check In:  Session Check In - 06/03/24 1034       Check-In   Supervising physician immediately available to respond to emergencies See telemetry face sheet for immediately available ER MD    Location [facility]-Cardiac & Pulmonary Rehab    Staff Present Leita Franks RN,BSN;Maxon Burnell BS, Exercise Physiologist;Margaret Best, MS, Exercise Physiologist;Kelly Dyane HECKLE, ACSM CEP, Exercise Physiologist    Virtual Visit No    Medication changes reported     No    Fall or balance concerns reported    No    Warm-up and Cool-down Performed on first and last piece of equipment    Resistance Training Performed Yes    VAD Patient? No    PAD/SET Patient? No      Pain Assessment   Currently in Pain? No/denies             Social History   Tobacco Use  Smoking Status Former   Current packs/day: 1.00   Average packs/day: 1 pack/day for 24.0 years (24.0 ttl pk-yrs)   Types: Cigarettes  Smokeless Tobacco Never    Goals Met:  Independence with exercise equipment Exercise tolerated well No report of concerns or symptoms today Strength training completed today  Goals Unmet:  Not Applicable  Comments: Pt able to follow exercise prescription today without complaint.  Will continue to monitor for progression.    Dr. Oneil Pinal is Medical Director for [facility] Cardiac Rehabilitation.  Dr. Fuad Aleskerov is Medical Director for [facility] Pulmonary Rehabilitation.

## 2024-06-05 ENCOUNTER — Encounter

## 2024-06-05 DIAGNOSIS — Z48812 Encounter for surgical aftercare following surgery on the circulatory system: Secondary | ICD-10-CM | POA: Diagnosis not present

## 2024-06-05 DIAGNOSIS — Z951 Presence of aortocoronary bypass graft: Secondary | ICD-10-CM

## 2024-06-05 NOTE — Progress Notes (Signed)
 Daily Session Note  Patient Details  Name: Shaun Anderson MRN: [id number] Date of Birth: 1960-09-08 Referring Provider:   Flowsheet Row Cardiac Rehab from 04/28/2024 in [facility] Cardiac and Pulmonary Rehab  Referring Provider Dr. Cara Lovelace, MD    Encounter Date: 06/05/2024  Check In:  Session Check In - 06/05/24 0911       Check-In   Supervising physician immediately available to respond to emergencies See telemetry face sheet for immediately available ER MD    Location [facility]-Cardiac & Pulmonary Rehab    Staff Present Burnard Davenport RN,BSN,MPA;Meredith Tressa RN,BSN;Joseph Rolinda RCP,RRT,BSRT;Noah Tickle, [place], Exercise Physiologist    Virtual Visit No    Medication changes reported     No    Fall or balance concerns reported    No    Warm-up and Cool-down Performed on first and last piece of equipment    Resistance Training Performed Yes    VAD Patient? No    PAD/SET Patient? No      Pain Assessment   Currently in Pain? No/denies             Social History   Tobacco Use  Smoking Status Former   Current packs/day: 1.00   Average packs/day: 1 pack/day for 24.0 years (24.0 ttl pk-yrs)   Types: Cigarettes  Smokeless Tobacco Never    Goals Met:  Independence with exercise equipment Exercise tolerated well No report of concerns or symptoms today Strength training completed today  Goals Unmet:  Not Applicable  Comments: Pt able to follow exercise prescription today without complaint.  Will continue to monitor for progression.    Dr. Oneil Pinal is Medical Director for [facility] Cardiac Rehabilitation.  Dr. Fuad Aleskerov is Medical Director for [facility] Pulmonary Rehabilitation.

## 2024-06-08 ENCOUNTER — Encounter

## 2024-06-08 DIAGNOSIS — Z951 Presence of aortocoronary bypass graft: Secondary | ICD-10-CM

## 2024-06-08 DIAGNOSIS — Z48812 Encounter for surgical aftercare following surgery on the circulatory system: Secondary | ICD-10-CM | POA: Diagnosis not present

## 2024-06-08 NOTE — Progress Notes (Signed)
 Daily Session Note  Patient Details  Name: Shaun Anderson MRN: [id number] Date of Birth: 1960/12/15 Referring Provider:   Flowsheet Row Cardiac Rehab from 04/28/2024 in [facility] Cardiac and Pulmonary Rehab  Referring Provider Dr. Cara Lovelace, MD    Encounter Date: 06/08/2024  Check In:  Session Check In - 06/08/24 0931       Check-In   Supervising physician immediately available to respond to emergencies See telemetry face sheet for immediately available ER MD    Location [facility]-Cardiac & Pulmonary Rehab    Staff Present Burnard Davenport RN,BSN,MPA;Joseph Rolinda RCP,RRT,BSRT;Laura Cates RN,BSN;Hagar Sadiq Dyane BS, ACSM CEP, Exercise Physiologist    Virtual Visit No    Medication changes reported     No    Fall or balance concerns reported    No    Warm-up and Cool-down Performed on first and last piece of equipment    Resistance Training Performed Yes    VAD Patient? No    PAD/SET Patient? No      Pain Assessment   Currently in Pain? No/denies             Social History   Tobacco Use  Smoking Status Former   Current packs/day: 1.00   Average packs/day: 1 pack/day for 24.0 years (24.0 ttl pk-yrs)   Types: Cigarettes  Smokeless Tobacco Never    Goals Met:  Independence with exercise equipment Exercise tolerated well Personal goals reviewed No report of concerns or symptoms today Strength training completed today  Goals Unmet:  Not Applicable  Comments: Pt able to follow exercise prescription today without complaint.  Will continue to monitor for progression.    Reviewed home exercise with pt today.  Pt plans to walk and look into joining the [facility] for exercise.  Reviewed THR, pulse, RPE, sign and symptoms, pulse oximetery and when to call 911 or MD.  Also discussed weather considerations and indoor options.  Pt voiced understanding.   Dr. Oneil Pinal is Medical Director for [facility] Cardiac Rehabilitation.  Dr. Fuad Aleskerov is Medical Director for [facility]  Pulmonary Rehabilitation.

## 2024-06-10 ENCOUNTER — Encounter: Admitting: *Deleted

## 2024-06-10 ENCOUNTER — Encounter: Payer: Self-pay | Admitting: *Deleted

## 2024-06-10 DIAGNOSIS — Z48812 Encounter for surgical aftercare following surgery on the circulatory system: Secondary | ICD-10-CM | POA: Diagnosis not present

## 2024-06-10 DIAGNOSIS — Z951 Presence of aortocoronary bypass graft: Secondary | ICD-10-CM

## 2024-06-10 NOTE — Progress Notes (Signed)
 Cardiac Individual Treatment Plan  Patient Details  Name: Shaun Anderson MRN: [id number] Date of Birth: 1960/10/27 Referring Provider:   Flowsheet Row Cardiac Rehab from 04/28/2024 in [facility] Cardiac and Pulmonary Rehab  Referring Provider Dr. Cara Lovelace, MD    Initial Encounter Date:  Flowsheet Row Cardiac Rehab from 04/28/2024 in [facility] Cardiac and Pulmonary Rehab  Date 04/28/24    Visit Diagnosis: S/P CABG (coronary artery bypass graft)  Patient's Home Medications on Admission:  Current Outpatient Medications:    allopurinol (ZYLOPRIM) 100 MG tablet, Take 100 mg by mouth daily., Disp: , Rfl:    amiodarone (PACERONE) 200 MG tablet, Take 200 mg by mouth daily., Disp: , Rfl:    amLODipine (NORVASC) 2.5 MG tablet, Take 2.5 mg by mouth., Disp: , Rfl:    apixaban  (ELIQUIS ) 5 MG TABS tablet, Take 5 mg by mouth 2 (two) times daily., Disp: , Rfl:    aspirin  EC 81 MG tablet, Take 81 mg by mouth daily., Disp: , Rfl:    atorvastatin  (LIPITOR) 80 MG tablet, Take 1 tablet (80 mg total) by mouth daily., Disp: 90 tablet, Rfl: 4   Cholecalciferol 50 MCG (2000 UT) CAPS, Take 2,000 Units by mouth daily., Disp: , Rfl:    empagliflozin (JARDIANCE) 10 MG TABS tablet, Take 10 mg by mouth daily., Disp: , Rfl:    ezetimibe  (ZETIA ) 10 MG tablet, Take 1 tablet (10 mg total) by mouth daily., Disp: 30 tablet, Rfl: 2   fluticasone (FLONASE) 50 MCG/ACT nasal spray, Place 1 spray into the nose daily., Disp: , Rfl:    fluticasone-salmeterol (ADVAIR) 250-50 MCG/ACT AEPB, Inhale 1 puff into the lungs 2 (two) times daily., Disp: , Rfl:    furosemide (LASIX) 40 MG tablet, Take 40 mg by mouth daily., Disp: , Rfl:    ipratropium (ATROVENT) 0.03 % nasal spray, Place 1 spray into the nose daily as needed for rhinitis., Disp: , Rfl:    metoprolol  tartrate (LOPRESSOR ) 25 MG tablet, Take 12.5 mg by mouth 2 (two) times daily., Disp: , Rfl:    NON FORMULARY, Pt uses a cpap nightly, Disp: , Rfl:   Past Medical  History: Past Medical History:  Diagnosis Date   A-fib (HCC)    CAD (coronary artery disease)    DVT (deep venous thrombosis) (HCC)    Hyperlipidemia    Hypertension    Sleep apnea    STEMI (ST elevation myocardial infarction) (HCC)    Stroke (HCC)     Tobacco Use: Social History   Tobacco Use  Smoking Status Former   Current packs/day: 1.00   Average packs/day: 1 pack/day for 24.0 years (24.0 ttl pk-yrs)   Types: Cigarettes  Smokeless Tobacco Never    Labs: Review Flowsheet       Latest Ref Rng & Units 08/13/2022  Labs for ITP Cardiac and Pulmonary Rehab  Cholestrol 0 - 200 mg/dL 858   LDL (calc) 0 - 99 mg/dL 73   HDL-C >59 mg/dL 53   Trlycerides <849 mg/dL 76   Hemoglobin J8r 4.8 - 5.6 % 5.9      Exercise Target Goals: Exercise Program Goal: Individual exercise prescription set using results from initial 6 min walk test and THRR while considering  patient's activity barriers and safety.   Exercise Prescription Goal: Initial exercise prescription builds to 30-45 minutes a day of aerobic activity, 2-3 days per week.  Home exercise guidelines will be given to patient during program as part of exercise prescription that the participant will  acknowledge.   Education: Aerobic Exercise: - Group verbal and visual presentation on the components of exercise prescription. Introduces F.I.T.T principle from ACSM for exercise prescriptions.  Reviews F.I.T.T. principles of aerobic exercise including progression. Written material provided at class time.   Education: Resistance Exercise: - Group verbal and visual presentation on the components of exercise prescription. Introduces F.I.T.T principle from ACSM for exercise prescriptions  Reviews F.I.T.T. principles of resistance exercise including progression. Written material provided at class time.    Education: Exercise & Equipment Safety: - Individual verbal instruction and demonstration of equipment use and safety with use of  the equipment. Flowsheet Row Cardiac Rehab from 06/10/2024 in [facility] Cardiac and Pulmonary Rehab  Date 04/28/24  Educator NT  Instruction Review Code 1- Verbalizes Understanding    Education: Exercise Physiology & General Exercise Guidelines: - Group verbal and written instruction with models to review the exercise physiology of the cardiovascular system and associated critical values. Provides general exercise guidelines with specific guidelines to those with heart or lung disease. Written material provided at class time. Flowsheet Row Cardiac Rehab from 08/27/2016 in [facility] Cardiac and Pulmonary Rehab  Date 08/13/16  Educator [facility]  Instruction Review Code (retired) 2- meets goals/outcomes    Education: Flexibility, Balance, Mind/Body Relaxation: - Group verbal and visual presentation with interactive activity on the components of exercise prescription. Introduces F.I.T.T principle from ACSM for exercise prescriptions. Reviews F.I.T.T. principles of flexibility and balance exercise training including progression. Also discusses the mind body connection.  Reviews various relaxation techniques to help reduce and manage stress (i.e. Deep breathing, progressive muscle relaxation, and visualization). Balance handout provided to take home. Written material provided at class time.   Activity Barriers & Risk Stratification:  Activity Barriers & Cardiac Risk Stratification - 04/23/24 1445       Activity Barriers & Cardiac Risk Stratification   Activity Barriers None    Cardiac Risk Stratification High          6 Minute Walk:  6 Minute Walk     Row Name 04/28/24 0945         6 Minute Walk   Phase Initial     Distance 1240 feet     Walk Time 6 minutes     # of Rest Breaks 0     MPH 2.35     METS 2.82     RPE 11     Perceived Dyspnea  0     VO2 Peak 9.86     Symptoms No     Resting HR 75 bpm     Resting BP 112/64     Resting Oxygen Saturation  94 %     Exercise Oxygen Saturation   during 6 min walk 97 %     Max Ex. HR 122 bpm     Max Ex. BP 134/72     2 Minute Post BP 128/68        Oxygen Initial Assessment:   Oxygen Re-Evaluation:   Oxygen Discharge (Final Oxygen Re-Evaluation):   Initial Exercise Prescription:  Initial Exercise Prescription - 04/28/24 0900       Date of Initial Exercise RX and Referring Provider   Date 04/28/24    Referring Provider Dr. Cara Lovelace, MD      Oxygen   Maintain Oxygen Saturation 88% or higher      Treadmill   MPH 2.4    Grade 0    Minutes 15    METs 2.84  NuStep   Level 3   T6 nustep   SPM 80    Minutes 15    METs 2.82      REL-XR   Level 3    Speed 50    Minutes 15    METs 2.82      T5 Nustep   Level 2    SPM 80    Minutes 15    METs 2.82      Prescription Details   Frequency (times per week) 3    Duration Progress to 30 minutes of continuous aerobic without signs/symptoms of physical distress      Intensity   THRR 40-80% of Max Heartrate 108-141    Ratings of Perceived Exertion 11-13    Perceived Dyspnea 0-4      Progression   Progression Continue to progress workloads to maintain intensity without signs/symptoms of physical distress.      Resistance Training   Training Prescription Yes    Weight 4 lb    Reps 10-15          Perform Capillary Blood Glucose checks as needed.  Exercise Prescription Changes:   Exercise Prescription Changes     Row Name 04/28/24 0900 05/07/24 1600 05/20/24 0700 06/03/24 1500 06/08/24 0900     Response to Exercise   Blood Pressure (Admit) 112/64 130/80 92/60 114/62 --   Blood Pressure (Exercise) 134/72 142/82 144/72 132/60 --   Blood Pressure (Exit) 128/68 124/80 106/64 112/66 --   Heart Rate (Admit) 75 bpm 78 bpm 55 bpm 65 bpm --   Heart Rate (Exercise) 122 bpm 133 bpm 129 bpm 128 bpm --   Heart Rate (Exit) 96 bpm 91 bpm 90 bpm 71 bpm --   Oxygen Saturation (Admit) 94 % -- -- -- --   Oxygen Saturation (Exercise) 97 % -- -- -- --    Rating of Perceived Exertion (Exercise) 11 15 14 15  --   Perceived Dyspnea (Exercise) 0 -- 0 0 --   Symptoms none none none none --   Comments Results 1st week of exercise sessions -- -- --   Duration -- Progress to 30 minutes of  aerobic without signs/symptoms of physical distress Progress to 30 minutes of  aerobic without signs/symptoms of physical distress Progress to 30 minutes of  aerobic without signs/symptoms of physical distress Progress to 30 minutes of  aerobic without signs/symptoms of physical distress   Intensity -- THRR unchanged THRR unchanged THRR unchanged THRR unchanged     Progression   Progression -- Continue to progress workloads to maintain intensity without signs/symptoms of physical distress. Continue to progress workloads to maintain intensity without signs/symptoms of physical distress. Continue to progress workloads to maintain intensity without signs/symptoms of physical distress. Continue to progress workloads to maintain intensity without signs/symptoms of physical distress.   Average METs -- 2.94 3.33 3.19 3.19     Resistance Training   Training Prescription -- Yes Yes Yes Yes   Weight -- 4 lb 4 lb 4 lb 4 lb   Reps -- 10-15 10-15 10-15 10-15     Interval Training   Interval Training -- No No No No     Treadmill   MPH -- 2.4 2.6 2.6 2.6   Grade -- 0 -- -- --   Minutes -- 15 15 15 15    METs -- 2.84 2.99 2.99 2.99     NuStep   Level -- 3  T6 nustep 3  T6 3  T6 3  T6   Minutes -- 15 15 15 15    METs -- 4.5 4.1 3.2 3.2     REL-XR   Level -- 3 3 3 3    Minutes -- 15 15 15 15    METs -- 2.4 4.8 4.3 4.3     T5 Nustep   Level -- -- 3 3 3    Minutes -- -- 15 15 15    METs -- -- 3.5 4 4      Home Exercise Plan   Plans to continue exercise at -- -- -- -- [employer] (comment)  [employer] and walking at home   Frequency -- -- -- -- Add 2 additional days to program exercise sessions.   Initial Home Exercises Provided -- -- -- -- 06/08/24     Oxygen    Maintain Oxygen Saturation -- 88% or higher 88% or higher 88% or higher 88% or higher      Exercise Comments:   Exercise Comments     Row Name 04/29/24 0951           Exercise Comments First full day of exercise!  Patient was oriented to gym and equipment including functions, settings, policies, and procedures.  Patient's individual exercise prescription and treatment plan were reviewed.  All starting workloads were established based on the results of the 6 minute walk test done at initial orientation visit.  The plan for exercise progression was also introduced and progression will be customized based on patient's performance and goals.          Exercise Goals and Review:   Exercise Goals     Row Name 04/28/24 0946             Exercise Goals   Increase Physical Activity Yes       Intervention Provide advice, education, support and counseling about physical activity/exercise needs.;Develop an individualized exercise prescription for aerobic and resistive training based on initial evaluation findings, risk stratification, comorbidities and participant's personal goals.       Expected Outcomes Short Term: Attend rehab on a regular basis to increase amount of physical activity.;Long Term: Add in home exercise to make exercise part of routine and to increase amount of physical activity.;Long Term: Exercising regularly at least 3-5 days a week.       Increase Strength and Stamina Yes       Intervention Provide advice, education, support and counseling about physical activity/exercise needs.;Develop an individualized exercise prescription for aerobic and resistive training based on initial evaluation findings, risk stratification, comorbidities and participant's personal goals.       Expected Outcomes Short Term: Increase workloads from initial exercise prescription for resistance, speed, and METs.;Short Term: Perform resistance training exercises routinely during rehab and add in  resistance training at home;Long Term: Improve cardiorespiratory fitness, muscular endurance and strength as measured by increased METs and functional capacity ( )       Able to understand and use rate of perceived exertion (RPE) scale Yes       Intervention Provide education and explanation on how to use RPE scale       Expected Outcomes Short Term: Able to use RPE daily in rehab to express subjective intensity level;Long Term:  Able to use RPE to guide intensity level when exercising independently       Able to understand and use Dyspnea scale Yes       Intervention Provide education and explanation on how to use Dyspnea scale       Expected Outcomes Long Term:  Able to use Dyspnea scale to guide intensity level when exercising independently;Short Term: Able to use Dyspnea scale daily in rehab to express subjective sense of shortness of breath during exertion       Knowledge and understanding of Target Heart Rate Range (THRR) Yes       Intervention Provide education and explanation of THRR including how the numbers were predicted and where they are located for reference       Expected Outcomes Short Term: Able to state/look up THRR;Long Term: Able to use THRR to govern intensity when exercising independently;Short Term: Able to use daily as guideline for intensity in rehab       Able to check pulse independently Yes       Intervention Provide education and demonstration on how to check pulse in carotid and radial arteries.;Review the importance of being able to check your own pulse for safety during independent exercise       Expected Outcomes Short Term: Able to explain why pulse checking is important during independent exercise;Long Term: Able to check pulse independently and accurately       Understanding of Exercise Prescription Yes       Intervention Provide education, explanation, and written materials on patient's individual exercise prescription       Expected Outcomes Short Term: Able to  explain program exercise prescription;Long Term: Able to explain home exercise prescription to exercise independently          Exercise Goals Re-Evaluation :  Exercise Goals Re-Evaluation     Row Name 04/29/24 0951 05/07/24 1624 05/20/24 0800 06/03/24 1554 06/08/24 0936     Exercise Goal Re-Evaluation   Exercise Goals Review Increase Physical Activity;Able to understand and use rate of perceived exertion (RPE) scale;Knowledge and understanding of Target Heart Rate Range (THRR);Understanding of Exercise Prescription;Increase Strength and Stamina;Able to understand and use Dyspnea scale;Able to check pulse independently Increase Physical Activity;Understanding of Exercise Prescription;Increase Strength and Stamina Increase Physical Activity;Understanding of Exercise Prescription;Increase Strength and Stamina Increase Physical Activity;Understanding of Exercise Prescription;Increase Strength and Stamina Increase Physical Activity;Able to understand and use rate of perceived exertion (RPE) scale;Knowledge and understanding of Target Heart Rate Range (THRR);Understanding of Exercise Prescription;Increase Strength and Stamina;Able to understand and use Dyspnea scale;Able to check pulse independently   Comments Reviewed RPE and dyspnea scale, THR and program prescription with pt today.  Pt voiced understanding and was given a copy of goals to take home. Jaedon is off to a good start in the program and completed his first week in this review. He worked at level 3 on the XR and T6 nustep. He had a workload on the treadmill of a speed of 2.4 mph with no incline. We will continue to monitor his progress in the program. Besnik is doing well in rehab. He has recently been able to increase his speed on the treadmill from 2.4mph to 2.41mph. He was also able to increase from level 2 to 3 on the T5 nustep. We will continue to monitor his progress in the program. Jaquaveon is doing well in rehab. He has maintained level 3  on the T6 and T5 nusteps and XR. He also maintained his speed on the treadmill to 2.6 mph with no incline. We will continue to monitor his progress in the program. Reviewed home exercise with pt today.  Pt plans to walk and look into joining the [facility] for exercise.  Reviewed THR, pulse, RPE, sign and symptoms, pulse oximetery and when to call 911 or MD.  Also discussed weather considerations and indoor options.  Pt voiced understanding.   Expected Outcomes Short: Use RPE daily to regulate intensity. Long: Follow program prescription in THR. Short: Continue to follow current exercise prescription. Long: Continue exercise to improve strength and stamina. Short: Continue to increase treadmill workloads. Long: Continue exercise to improve strength and stamina. Short: Try levle 4 on the nustep and XR. Long: Continue exercise to improve strength and stamina. Short: add 1-2 days a week of exercise on off days for cardiac rehab and look into joining the [facility]. Long: maintain independent exercise routine upon graduation from cardiac rehab.      Discharge Exercise Prescription (Final Exercise Prescription Changes):  Exercise Prescription Changes - 06/08/24 0900       Response to Exercise   Duration Progress to 30 minutes of  aerobic without signs/symptoms of physical distress    Intensity THRR unchanged      Progression   Progression Continue to progress workloads to maintain intensity without signs/symptoms of physical distress.    Average METs 3.19      Resistance Training   Training Prescription Yes    Weight 4 lb    Reps 10-15      Interval Training   Interval Training No      Treadmill   MPH 2.6    Minutes 15    METs 2.99      NuStep   Level 3   T6   Minutes 15    METs 3.2      REL-XR   Level 3    Minutes 15    METs 4.3      T5 Nustep   Level 3    Minutes 15    METs 4      Home Exercise Plan   Plans to continue exercise at [employer] (comment)   [employer] and walking at home    Frequency Add 2 additional days to program exercise sessions.    Initial Home Exercises Provided 06/08/24      Oxygen   Maintain Oxygen Saturation 88% or higher          Nutrition:  Target Goals: Understanding of nutrition guidelines, daily intake of sodium 1500mg , cholesterol 200mg , calories 30% from fat and 7% or less from saturated fats, daily to have 5 or more servings of fruits and vegetables.  Education: Nutrition 1 -Group instruction provided by verbal, written material, interactive activities, discussions, models, and posters to present general guidelines for heart healthy nutrition including macronutrients, label reading, and promoting whole foods over processed counterparts. Education serves as pensions consultant of discussion of heart healthy eating for all. Written material provided at class time.    Education: Nutrition 2 -Group instruction provided by verbal, written material, interactive activities, discussions, models, and posters to present general guidelines for heart healthy nutrition including sodium, cholesterol, and saturated fat. Providing guidance of habit forming to improve blood pressure, cholesterol, and body weight. Written material provided at class time.     Biometrics:  Pre Biometrics - 04/28/24 0947       Pre Biometrics   Height 5' 8 (1.727 m)    Weight 254 lb 8 oz (115.4 kg)    Waist Circumference 48 inches    Hip Circumference 45.5 inches    Waist to Hip Ratio 1.05 %    BMI (Calculated) 38.71    Single Leg Stand 2.13 seconds           Nutrition Therapy Plan and Nutrition Goals:  Nutrition  Therapy & Goals - 04/29/24 1516       Nutrition Therapy   Diet Cardiac, Low Na    Protein (specify units) 90    Fiber 30 grams    Whole Grain Foods 3 servings    Saturated Fats 15 max. grams    Fruits and Vegetables 5 servings/day    Sodium 2 grams      Personal Nutrition Goals   Nutrition Goal Read labels and reduce sodium intake to below  2300mg . Ideally 1500mg  per day.    Personal Goal #2 Reduce saturated fat, less than 12g per day. Replace bad fats for more heart healthy fats.    Personal Goal #3 Include more colorful produce, aim for 5-8 servings of fruits and veggies per day    Comments Oley reports he cut out soda four or five months ago when he found out he might need stents. He said he was trying to be healthier. Commended him on this change. Educated on how cutting back on soda is overall healthy but will have the biggest impact on his A1C, which was measured at 6.0% 1 month ago. Spoke with him about heart health and how saturated fats, sodium and cholesterol were the nutrients to monitor for heart health. Reviewed his food recall and discussed the high saturated fat, sodium and cholesterol in these foods he eats often. He admits it will be a lot to change to be more heart healthy. Encouraged him to work on it and focus on improving and not meeting perfect standards before the program ends. Provided guideline limits to work towards of less than 12g saturated fat, less than 1500mg  sodium, and less than 250mg  cholesterol. Reviewed mediterranean diet handout, educated on types of fats, sources, and how to read labels.      Intervention Plan   Intervention Prescribe, educate and counsel regarding individualized specific dietary modifications aiming towards targeted core components such as weight, hypertension, lipid management, diabetes, heart failure and other comorbidities.;Nutrition handout(s) given to patient.    Expected Outcomes Short Term Goal: Understand basic principles of dietary content, such as calories, fat, sodium, cholesterol and nutrients.;Short Term Goal: A plan has been developed with personal nutrition goals set during dietitian appointment.;Long Term Goal: Adherence to prescribed nutrition plan.          Nutrition Assessments:  MEDIFICTS Score Key: >=70 Need to make dietary changes  40-70 Heart Healthy  Diet <= 40 Therapeutic Level Cholesterol Diet  Flowsheet Row Cardiac Rehab from 05/04/2024 in [facility] Cardiac and Pulmonary Rehab  Picture Your Plate Total Score on Admission 36   Picture Your Plate Scores: <59 Unhealthy dietary pattern with much room for improvement. 41-50 Dietary pattern unlikely to meet recommendations for good health and room for improvement. 51-60 More healthful dietary pattern, with some room for improvement.  >60 Healthy dietary pattern, although there may be some specific behaviors that could be improved.    Nutrition Goals Re-Evaluation:  Nutrition Goals Re-Evaluation     Row Name 05/20/24 0958             Goals   Current Weight 243 lb (110.2 kg)       Comment Lindon has been cutting back on sweets and does not snack after eight pm. He states he used to drink alot of chocolate milk and has then cut back . He is ready to get lose some weight and is going to cut back a little more and make healthier food choices.  Expected Outcome Short: make healthier food choices. Long: lose some weight.          Nutrition Goals Discharge (Final Nutrition Goals Re-Evaluation):  Nutrition Goals Re-Evaluation - 05/20/24 0958       Goals   Current Weight 243 lb (110.2 kg)    Comment Emmanual has been cutting back on sweets and does not snack after eight pm. He states he used to drink alot of chocolate milk and has then cut back . He is ready to get lose some weight and is going to cut back a little more and make healthier food choices.    Expected Outcome Short: make healthier food choices. Long: lose some weight.          Psychosocial: Target Goals: Acknowledge presence or absence of significant depression and/or stress, maximize coping skills, provide positive support system. Participant is able to verbalize types and ability to use techniques and skills needed for reducing stress and depression.   Education: Stress, Anxiety, and Depression - Group verbal and  visual presentation to define topics covered.  Reviews how body is impacted by stress, anxiety, and depression.  Also discusses healthy ways to reduce stress and to treat/manage anxiety and depression. Written material provided at class time. Flowsheet Row Cardiac Rehab from 08/27/2016 in [facility] Cardiac and Pulmonary Rehab  Date 08/01/16  Educator [facility]  Instruction Review Code (retired) 2- meets goals/outcomes    Education: Sleep Hygiene -Provides group verbal and written instruction about how sleep can affect your health.  Define sleep hygiene, discuss sleep cycles and impact of sleep habits. Review good sleep hygiene tips.   Initial Review & Psychosocial Screening:  Initial Psych Review & Screening - 04/23/24 1449       Initial Review   Current issues with None Identified      Family Dynamics   Good Support System? Yes   wife, son, mother   Comments Ladarrius 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 He is now home with his wife and states he is feeling well. He reports that he has had decreased appetite and changes in the taste of food, but attributes this to medication changes, recent intubation, and the fact that he stopped smoking 5 months ago. Emitt reports that he is ready to get back to his active lifestyle. He does admit to episodes of feeling down or increased stress, however states that his wife and son offer a good support system and he enjoys walking his dog when he starts to feel this way. He is looking forward to starting the program again and verbalized understanding of the need to be vocal about how he was feeling and if anything was causing discomfort, specifically at his incision.      Barriers   Psychosocial barriers to participate in program The patient should benefit from training in stress management and relaxation.      Screening Interventions   Interventions Encouraged to exercise;To provide support and resources with identified  psychosocial needs    Expected Outcomes Short Term goal: Utilizing psychosocial counselor, staff and physician to assist with identification of specific Stressors or current issues interfering with healing process. Setting desired goal for each stressor or current issue identified.;Long Term Goal: Stressors or current issues are controlled or eliminated.;Short Term goal: Identification and review with participant of any Quality of Life or Depression concerns found by scoring the questionnaire.;Long Term goal: The participant improves quality of Life and PHQ9 Scores as seen by post scores  and/or verbalization of changes          Quality of Life Scores:   Quality of Life - 05/04/24 1350       Quality of Life   Select Quality of Life      Quality of Life Scores   Health/Function Pre 26 %    Socioeconomic Pre 24.64 %    Psych/Spiritual Pre 26.57 %    Family Pre 30 %    GLOBAL Pre 26.43 %         Scores of 19 and below usually indicate a poorer quality of life in these areas.  A difference of  2-3 points is a clinically meaningful difference.  A difference of 2-3 points in the total score of the Quality of Life Index has been associated with significant improvement in overall quality of life, self-image, physical symptoms, and general health in studies assessing change in quality of life.  PHQ-9: Review Flowsheet       05/20/2024 04/28/2024 07/30/2016  Depression screen PHQ 2/9  Decreased Interest 2 1 0  Down, Depressed, Hopeless 2 1 0  PHQ - 2 Score 4 2 0  Altered sleeping 1 2 1   Tired, decreased energy 1 1 1   Change in appetite 2 1 1   Feeling bad or failure about yourself  0 0 0  Trouble concentrating 0 0 0  Moving slowly or fidgety/restless 0 0 0  Suicidal thoughts 0 0 0   PHQ-9 Score 8  6  3    Difficult doing work/chores Somewhat difficult Not difficult at all Not difficult at all    Details       Data saved with a previous flowsheet row definition         Interpretation of Total Score  Total Score Depression Severity:  1-4 = Minimal depression, 5-9 = Mild depression, 10-14 = Moderate depression, 15-19 = Moderately severe depression, 20-27 = Severe depression   Psychosocial Evaluation and Intervention:  Psychosocial Evaluation - 04/23/24 1452       Psychosocial Evaluation & Interventions   Interventions Encouraged to exercise with the program and follow exercise prescription;Relaxation education;Stress management education    Comments 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 He is now home with his wife and states he is feeling well. He reports that he has had decreased appetite and changes in the taste of food, but attributes this to medication changes, recent intubation, and the fact that he stopped smoking 5 months ago. Berk reports that he is ready to get back to his active lifestyle. He does admit to episodes of feeling down or increased stress, however states that his wife and son offer a good support system and he enjoys walking his dog when he starts to feel this way. He is looking forward to starting the program again and verbalized understanding of the need to be vocal about how he was feeling and if anything was causing discomfort, specifically at his incision.    Expected Outcomes Short: Attend cardiac rehab for education and exercise. Long: Develop and maintain positive self care habits.    Continue Psychosocial Services  Follow up required by staff          Psychosocial Re-Evaluation:  Psychosocial Re-Evaluation     Row Name 05/20/24 1005             Psychosocial Re-Evaluation   Current issues with Current Stress Concerns       Comments Reviewed patient health questionnaire (  PHQ-9) with patient for follow up. Previously, patients score indicated signs/symptoms of depression.  Reviewed to see if patient is improving symptom wise while in program.  Score declined and patient  states that it is because he has had some stressors at home and is not able to do what he wants to do yet.       Expected Outcomes Short: Continue to work toward an improvement in PHQ9 scores by attending [facility] regularly. Long: Continue to improve stress and depression coping skills by talking with staff and attending [facility] regularly and work toward a positive mental state.       Interventions Encouraged to attend Cardiac Rehabilitation for the exercise       Continue Psychosocial Services  Follow up required by staff          Psychosocial Discharge (Final Psychosocial Re-Evaluation):  Psychosocial Re-Evaluation - 05/20/24 1005       Psychosocial Re-Evaluation   Current issues with Current Stress Concerns    Comments Reviewed patient health questionnaire (PHQ-9) with patient for follow up. Previously, patients score indicated signs/symptoms of depression.  Reviewed to see if patient is improving symptom wise while in program.  Score declined and patient states that it is because he has had some stressors at home and is not able to do what he wants to do yet.    Expected Outcomes Short: Continue to work toward an improvement in PHQ9 scores by attending [facility] regularly. Long: Continue to improve stress and depression coping skills by talking with staff and attending [facility] regularly and work toward a positive mental state.    Interventions Encouraged to attend Cardiac Rehabilitation for the exercise    Continue Psychosocial Services  Follow up required by staff          Vocational Rehabilitation: Provide vocational rehab assistance to qualifying candidates.   Vocational Rehab Evaluation & Intervention:  Vocational Rehab - 04/23/24 1449       Initial Vocational Rehab Evaluation & Intervention   Assessment shows need for Vocational Rehabilitation No   patient is retired         Education: Education Goals: Education classes will be provided on a variety of topics  geared toward better understanding of heart health and risk factor modification. Participant will state understanding/return demonstration of topics presented as noted by education test scores.  Learning Barriers/Preferences:  Learning Barriers/Preferences - 04/23/24 1449       Learning Barriers/Preferences   Learning Barriers None    Learning Preferences None          General Cardiac Education Topics:  AED/CPR: - Group verbal and written instruction with the use of models to demonstrate the basic use of the AED with the basic ABC's of resuscitation.   Test and Procedures: - Group verbal and visual presentation and models provide information about basic cardiac anatomy and function. Reviews the testing methods done to diagnose heart disease and the outcomes of the test results. Describes the treatment choices: Medical Management, Angioplasty, or Coronary Bypass Surgery for treating various heart conditions including Myocardial Infarction, Angina, Valve Disease, and Cardiac Arrhythmias. Written material provided at class time. Flowsheet Row Cardiac Rehab from 08/27/2016 in [facility] Cardiac and Pulmonary Rehab  Date 08/27/16  Educator MA  Instruction Review Code (retired) 2- meets goals/outcomes    Medication Safety: - Group verbal and visual instruction to review commonly prescribed medications for heart and lung disease. Reviews the medication, class of the drug, and side effects. Includes the steps to properly  store meds and maintain the prescription regimen. Written material provided at class time.   Intimacy: - Group verbal instruction through game format to discuss how heart and lung disease can affect sexual intimacy. Written material provided at class time.   Know Your Numbers and Heart Failure: - Group verbal and visual instruction to discuss disease risk factors for cardiac and pulmonary disease and treatment options.  Reviews associated critical values for Overweight/Obesity,  Hypertension, Cholesterol, and Diabetes.  Discusses basics of heart failure: signs/symptoms and treatments.  Introduces Heart Failure Zone chart for action plan for heart failure. Written material provided at class time. Flowsheet Row Cardiac Rehab from 08/27/2016 in [facility] Cardiac and Pulmonary Rehab  Date 08/27/16  Educator MA  Instruction Review Code (retired) 2- meets goals/outcomes    Infection Prevention: - Provides verbal and written material to individual with discussion of infection control including proper hand washing and proper equipment cleaning during exercise session. Flowsheet Row Cardiac Rehab from 06/10/2024 in [facility] Cardiac and Pulmonary Rehab  Date 04/28/24  Educator NT  Instruction Review Code 1- Verbalizes Understanding    Falls Prevention: - Provides verbal and written material to individual with discussion of falls prevention and safety. Flowsheet Row Cardiac Rehab from 06/10/2024 in [facility] Cardiac and Pulmonary Rehab  Date 04/28/24  Educator NT  Instruction Review Code 1- Verbalizes Understanding    Other: -Provides group and verbal instruction on various topics (see comments)   Knowledge Questionnaire Score:  Knowledge Questionnaire Score - 05/04/24 1351       Knowledge Questionnaire Score   Pre Score 23/26          Core Components/Risk Factors/Patient Goals at Admission:  Personal Goals and Risk Factors at Admission - 04/23/24 1447       Core Components/Risk Factors/Patient Goals on Admission    Weight Management Yes;Weight Loss    Intervention Weight Management: Develop a combined nutrition and exercise program designed to reach desired caloric intake, while maintaining appropriate intake of nutrient and fiber, sodium and fats, and appropriate energy expenditure required for the weight goal.;Weight Management: Provide education and appropriate resources to help participant work on and attain dietary goals.;Weight Management/Obesity: Establish  reasonable short term and long term weight goals.;Obesity: Provide education and appropriate resources to help participant work on and attain dietary goals.    Goal Weight: Long Term 225 lb (102.1 kg)    Expected Outcomes Short Term: Continue to assess and modify interventions until short term weight is achieved;Weight Loss: Understanding of general recommendations for a balanced deficit meal plan, which promotes 1-2 lb weight loss per week and includes a negative energy balance of [phone] kcal/d;Long Term: Adherence to nutrition and physical activity/exercise program aimed toward attainment of established weight goal;Understanding recommendations for meals to include 15-35% energy as protein, 25-35% energy from fat, 35-60% energy from carbohydrates, less than 200mg  of dietary cholesterol, 20-35 gm of total fiber daily;Understanding of distribution of calorie intake throughout the day with the consumption of 4-5 meals/snacks    Hypertension Yes    Intervention Provide education on lifestyle modifcations including regular physical activity/exercise, weight management, moderate sodium restriction and increased consumption of fresh fruit, vegetables, and low fat dairy, alcohol moderation, and smoking cessation.;Monitor prescription use compliance.    Expected Outcomes Short Term: Continued assessment and intervention until BP is < 140/34mm HG in hypertensive participants. < 130/46mm HG in hypertensive participants with diabetes, heart failure or chronic kidney disease.;Long Term: Maintenance of blood pressure at goal levels.    Lipids Yes  Intervention Provide education and support for participant on nutrition & aerobic/resistive exercise along with prescribed medications to achieve LDL 70mg , HDL >40mg .    Expected Outcomes Short Term: Participant states understanding of desired cholesterol values and is compliant with medications prescribed. Participant is following exercise prescription and nutrition  guidelines.;Long Term: Cholesterol controlled with medications as prescribed, with individualized exercise RX and with personalized nutrition plan. Value goals: LDL < 70mg , HDL > 40 mg.          Education:Diabetes - Individual verbal and written instruction to review signs/symptoms of diabetes, desired ranges of glucose level fasting, after meals and with exercise. Acknowledge that pre and post exercise glucose checks will be done for 3 sessions at entry of program.   Core Components/Risk Factors/Patient Goals Review:   Goals and Risk Factor Review     Row Name 05/20/24 9047             Core Components/Risk Factors/Patient Goals Review   Personal Goals Review Weight Management/Obesity       Review Tyqwan wants to reach a weight goal of 225 pounds. His current current weight is at 243 pounds. He is cutting back on sweets and will monitor his weight at home.       Expected Outcomes Short: lose a few pounds in the next couple weeks. Long: reach weight goal.          Core Components/Risk Factors/Patient Goals at Discharge (Final Review):   Goals and Risk Factor Review - 05/20/24 0952       Core Components/Risk Factors/Patient Goals Review   Personal Goals Review Weight Management/Obesity    Review Jerric wants to reach a weight goal of 225 pounds. His current current weight is at 243 pounds. He is cutting back on sweets and will monitor his weight at home.    Expected Outcomes Short: lose a few pounds in the next couple weeks. Long: reach weight goal.          ITP Comments:  ITP Comments     Row Name 04/23/24 1444 04/28/24 0943 04/29/24 0951 05/13/24 0937 06/10/24 1116   ITP Comments Initial phone call completed. Diagnosis can be found in [facility] 03/16/2024. EP Orientation scheduled for Tuesday 10/7 at 8:00. Completed and gym orientation for cardiac rehab. Initial ITP created and sent for review to Dr. Oneil Pinal, Medical Director. First full day of exercise!  Patient was  oriented to gym and equipment including functions, settings, policies, and procedures.  Patient's individual exercise prescription and treatment plan were reviewed.  All starting workloads were established based on the results of the 6 minute walk test done at initial orientation visit.  The plan for exercise progression was also introduced and progression will be customized based on patient's performance and goals. 30 Day review completed. Medical Director ITP review done, changes made as directed, and signed approval by Medical Director. 30 Day review completed. Medical Director ITP review done, changes made as directed, and signed approval by Medical Director.      Comments: 30 day review ITP

## 2024-06-10 NOTE — Progress Notes (Signed)
 Daily Session Note  Patient Details  Name: Shaun Anderson MRN: [id number] Date of Birth: Jun 23, 1961 Referring Provider:   Flowsheet Row Cardiac Rehab from 04/28/2024 in [facility] Cardiac and Pulmonary Rehab  Referring Provider Dr. Cara Lovelace, MD    Encounter Date: 06/10/2024  Check In:  Session Check In - 06/10/24 0954       Check-In   Supervising physician immediately available to respond to emergencies See telemetry face sheet for immediately available ER MD    Location [facility]-Cardiac & Pulmonary Rehab    Staff Present Shaun Anderson, BS, Exercise Physiologist;Shaun Best, MS, Exercise Physiologist;Shaun Rolinda NORWOOD HARMAN [name], RN, DNP, NE-BC;Shaun Bollinger RN,BSN,MPA    Virtual Visit No    Medication changes reported     No    Fall or balance concerns reported    No    Warm-up and Cool-down Performed on first and last piece of equipment    Resistance Training Performed Yes    VAD Patient? No    PAD/SET Patient? No      Pain Assessment   Currently in Pain? No/denies             Social History   Tobacco Use  Smoking Status Former   Current packs/day: 1.00   Average packs/day: 1 pack/day for 24.0 years (24.0 ttl pk-yrs)   Types: Cigarettes  Smokeless Tobacco Never    Goals Met:  Independence with exercise equipment Exercise tolerated well No report of concerns or symptoms today Strength training completed today  Goals Unmet:  Not Applicable  Comments: Pt able to follow exercise prescription today without complaint.  Will continue to monitor for progression.    Dr. Oneil Pinal is Medical Director for [facility] Cardiac Rehabilitation.  Dr. Fuad Aleskerov is Medical Director for [facility] Pulmonary Rehabilitation.

## 2024-06-12 ENCOUNTER — Encounter

## 2024-06-12 DIAGNOSIS — Z48812 Encounter for surgical aftercare following surgery on the circulatory system: Secondary | ICD-10-CM | POA: Diagnosis not present

## 2024-06-12 DIAGNOSIS — Z951 Presence of aortocoronary bypass graft: Secondary | ICD-10-CM

## 2024-06-12 NOTE — Progress Notes (Signed)
 Daily Session Note  Patient Details  Name: Shaun Anderson MRN: [id number] Date of Birth: 01/17/1961 Referring Provider:   Flowsheet Row Cardiac Rehab from 04/28/2024 in [facility] Cardiac and Pulmonary Rehab  Referring Provider Dr. Cara Lovelace, MD    Encounter Date: 06/12/2024  Check In:  Session Check In - 06/12/24 0937       Check-In   Supervising physician immediately available to respond to emergencies See telemetry face sheet for immediately available ER MD    Location [facility]-Cardiac & Pulmonary Rehab    Staff Present Burnard Davenport RN,BSN,MPA;Joseph Hood RCP,RRT,BSRT;Maxon Conetta BS, Exercise Physiologist;Noah Tickle, BS, Exercise Physiologist    Virtual Visit No    Medication changes reported     Yes    Comments added furosemide    Fall or balance concerns reported    No    Warm-up and Cool-down Performed on first and last piece of equipment    Resistance Training Performed Yes    VAD Patient? No    PAD/SET Patient? No      Pain Assessment   Currently in Pain? No/denies             Social History   Tobacco Use  Smoking Status Former   Current packs/day: 1.00   Average packs/day: 1 pack/day for 24.0 years (24.0 ttl pk-yrs)   Types: Cigarettes  Smokeless Tobacco Never    Goals Met:  Independence with exercise equipment Exercise tolerated well No report of concerns or symptoms today Strength training completed today  Goals Unmet:  Not Applicable  Comments: Pt able to follow exercise prescription today without complaint.  Will continue to monitor for progression.    Dr. Oneil Pinal is Medical Director for [facility] Cardiac Rehabilitation.  Dr. Fuad Aleskerov is Medical Director for [facility] Pulmonary Rehabilitation.

## 2024-06-15 ENCOUNTER — Encounter

## 2024-06-17 ENCOUNTER — Encounter

## 2024-06-22 ENCOUNTER — Encounter

## 2024-06-22 DIAGNOSIS — Z951 Presence of aortocoronary bypass graft: Secondary | ICD-10-CM | POA: Diagnosis present

## 2024-06-22 NOTE — Progress Notes (Signed)
 Daily Session Note  Patient Details  Name: Shaun Anderson MRN: [id number] Date of Birth: [birth year] Referring Provider:   Flowsheet Row Cardiac Rehab from 04/28/2024 in [facility] Cardiac and Pulmonary Rehab  Referring Provider Dr. Cara Lovelace, MD    Encounter Date: 06/22/2024  Check In:  Session Check In - 06/22/24 0918       Check-In   Supervising physician immediately available to respond to emergencies See telemetry face sheet for immediately available ER MD    Location [facility]-Cardiac & Pulmonary Rehab    Staff Present Burnard Davenport RN,BSN,MPA;Maxon Burnell BS, Exercise Physiologist;[facility] Dyane BS, ACSM CEP, Exercise Physiologist    Virtual Visit No    Medication changes reported     No    Fall or balance concerns reported    No    Warm-up and Cool-down Performed on first and last piece of equipment    Resistance Training Performed Yes    VAD Patient? No    PAD/SET Patient? No      Pain Assessment   Currently in Pain? No/denies             Social History   Tobacco Use  Smoking Status Former   Current packs/day: 1.00   Average packs/day: 1 pack/day for 24.0 years (24.0 ttl pk-yrs)   Types: Cigarettes  Smokeless Tobacco Never    Goals Met:  Independence with exercise equipment Exercise tolerated well No report of concerns or symptoms today Strength training completed today  Goals Unmet:  Not Applicable  Comments: Pt able to follow exercise prescription today without complaint.  Will continue to monitor for progression.    Dr. Oneil Pinal is Medical Director for [facility] Cardiac Rehabilitation.  Dr. Fuad Aleskerov is Medical Director for [facility] Pulmonary Rehabilitation.

## 2024-06-24 ENCOUNTER — Encounter: Admitting: Emergency Medicine

## 2024-06-24 DIAGNOSIS — Z951 Presence of aortocoronary bypass graft: Secondary | ICD-10-CM

## 2024-06-24 NOTE — Progress Notes (Signed)
 Daily Session Note  Patient Details  Name: Shaun Anderson MRN: [id number] Date of Birth: 03/20/1961 Referring Provider:   Flowsheet Row Cardiac Rehab from 04/28/2024 in [facility] Cardiac and Pulmonary Rehab  Referring Provider Dr. Cara Lovelace, MD    Encounter Date: 06/24/2024  Check In:  Session Check In - 06/24/24 1019       Check-In   Supervising physician immediately available to respond to emergencies See telemetry face sheet for immediately available ER MD    Location [facility]-Cardiac & Pulmonary Rehab    Staff Present Leita Franks RN,BSN;Joseph Hood RCP,RRT,BSRT;Maxon Conetta BS, Exercise Physiologist;Denise Rhew PhD, RN,CNS,CEN    Virtual Visit No    Medication changes reported     No    Fall or balance concerns reported    No    Warm-up and Cool-down Performed on first and last piece of equipment    Resistance Training Performed Yes    VAD Patient? No    PAD/SET Patient? No      Pain Assessment   Currently in Pain? No/denies             Social History   Tobacco Use  Smoking Status Former   Current packs/day: 1.00   Average packs/day: 1 pack/day for 24.0 years (24.0 ttl pk-yrs)   Types: Cigarettes  Smokeless Tobacco Never    Goals Met:  Independence with exercise equipment Exercise tolerated well No report of concerns or symptoms today Strength training completed today  Goals Unmet:  Not Applicable  Comments: Pt able to follow exercise prescription today without complaint.  Will continue to monitor for progression.    Dr. Oneil Pinal is Medical Director for [facility] Cardiac Rehabilitation.  Dr. Fuad Aleskerov is Medical Director for [facility] Pulmonary Rehabilitation.

## 2024-06-26 ENCOUNTER — Encounter

## 2024-06-29 ENCOUNTER — Encounter

## 2024-06-29 DIAGNOSIS — Z951 Presence of aortocoronary bypass graft: Secondary | ICD-10-CM | POA: Diagnosis not present

## 2024-06-29 NOTE — Progress Notes (Signed)
 Daily Session Note  Patient Details  Name: Shaun Anderson MRN: [id number] Date of Birth: [birth year] Referring Provider:   Flowsheet Row Cardiac Rehab from 04/28/2024 in [facility] Cardiac and Pulmonary Rehab  Referring Provider Dr. Cara Lovelace, MD    Encounter Date: 06/29/2024  Check In:  Session Check In - 06/29/24 0903       Check-In   Supervising physician immediately available to respond to emergencies See telemetry face sheet for immediately available ER MD    Location [facility]-Cardiac & Pulmonary Rehab    Staff Present Burnard Davenport RN,BSN,MPA;Joseph Hood RCP,RRT,BSRT;Maxon Burnell BS, Exercise Physiologist;Samariah Hokenson Dyane HECKLE, ACSM CEP, Exercise Physiologist    Virtual Visit No    Medication changes reported     Yes    Comments added abx    Fall or balance concerns reported    No    Warm-up and Cool-down Performed on first and last piece of equipment    Resistance Training Performed Yes    VAD Patient? No    PAD/SET Patient? No      Pain Assessment   Currently in Pain? No/denies             Social History   Tobacco Use  Smoking Status Former   Current packs/day: 1.00   Average packs/day: 1 pack/day for 24.0 years (24.0 ttl pk-yrs)   Types: Cigarettes  Smokeless Tobacco Never    Goals Met:  Independence with exercise equipment Exercise tolerated well Personal goals reviewed No report of concerns or symptoms today Strength training completed today  Goals Unmet:  Not Applicable  Comments: Pt able to follow exercise prescription today without complaint.  Will continue to monitor for progression.    Dr. Oneil Pinal is Medical Director for [facility] Cardiac Rehabilitation.  Dr. Fuad Aleskerov is Medical Director for [facility] Pulmonary Rehabilitation.

## 2024-07-01 ENCOUNTER — Encounter

## 2024-07-01 DIAGNOSIS — Z951 Presence of aortocoronary bypass graft: Secondary | ICD-10-CM

## 2024-07-01 NOTE — Progress Notes (Signed)
 Daily Session Note  Patient Details  Name: Shaun Anderson MRN: [id number] Date of Birth: 1960/11/02 Referring Provider:   Flowsheet Row Cardiac Rehab from 04/28/2024 in [facility] Cardiac and Pulmonary Rehab  Referring Provider Dr. Cara Lovelace, MD    Encounter Date: 07/01/2024  Check In:  Session Check In - 07/01/24 0726       Check-In   Supervising physician immediately available to respond to emergencies See telemetry face sheet for immediately available ER MD    Location [facility]-Cardiac & Pulmonary Rehab    Staff Present Burnard Davenport RN,BSN,MPA;Joseph Rolinda RCP,RRT,BSRT;Margaret Best, MS, Exercise Physiologist;Noah Tickle, BS, Exercise Physiologist    Virtual Visit No    Medication changes reported     No    Fall or balance concerns reported    No    Warm-up and Cool-down Performed on first and last piece of equipment    Resistance Training Performed Yes    VAD Patient? No    PAD/SET Patient? No      Pain Assessment   Currently in Pain? No/denies             Social History   Tobacco Use  Smoking Status Former   Current packs/day: 1.00   Average packs/day: 1 pack/day for 24.0 years (24.0 ttl pk-yrs)   Types: Cigarettes  Smokeless Tobacco Never    Goals Met:  Independence with exercise equipment Exercise tolerated well No report of concerns or symptoms today Strength training completed today  Goals Unmet:  Not Applicable  Comments: Pt able to follow exercise prescription today without complaint.  Will continue to monitor for progression.    Dr. Oneil Pinal is Medical Director for [facility] Cardiac Rehabilitation.  Dr. Fuad Aleskerov is Medical Director for [facility] Pulmonary Rehabilitation.

## 2024-07-03 ENCOUNTER — Ambulatory Visit
Admission: RE | Admit: 2024-07-03 | Discharge: 2024-07-03 | Disposition: A | Discharge status: Home / Self Care | Source: Ambulatory Visit | Attending: Acute Care | Admitting: Acute Care

## 2024-07-03 ENCOUNTER — Encounter: Admitting: Emergency Medicine

## 2024-07-03 DIAGNOSIS — I2510 Atherosclerotic heart disease of native coronary artery without angina pectoris: Secondary | ICD-10-CM | POA: Diagnosis not present

## 2024-07-03 DIAGNOSIS — J439 Emphysema, unspecified: Secondary | ICD-10-CM | POA: Insufficient documentation

## 2024-07-03 DIAGNOSIS — Z122 Encounter for screening for malignant neoplasm of respiratory organs: Secondary | ICD-10-CM

## 2024-07-03 DIAGNOSIS — I700 Atherosclerosis of aorta: Secondary | ICD-10-CM | POA: Diagnosis not present

## 2024-07-03 DIAGNOSIS — Z951 Presence of aortocoronary bypass graft: Secondary | ICD-10-CM | POA: Diagnosis not present

## 2024-07-03 DIAGNOSIS — F17210 Nicotine dependence, cigarettes, uncomplicated: Secondary | ICD-10-CM | POA: Insufficient documentation

## 2024-07-03 DIAGNOSIS — J479 Bronchiectasis, uncomplicated: Secondary | ICD-10-CM | POA: Diagnosis not present

## 2024-07-03 DIAGNOSIS — Z87891 Personal history of nicotine dependence: Secondary | ICD-10-CM

## 2024-07-03 NOTE — Progress Notes (Signed)
 Daily Session Note  Patient Details  Name: Shaun Anderson MRN: [id number] Date of Birth: [birth year] Referring Provider:   Flowsheet Row Cardiac Rehab from 04/28/2024 in [facility] Cardiac and Pulmonary Rehab  Referring Provider Dr. Cara Lovelace, MD    Encounter Date: 07/03/2024  Check In:  Session Check In - 07/03/24 0944       Check-In   Supervising physician immediately available to respond to emergencies See telemetry face sheet for immediately available ER MD    Location [facility]-Cardiac & Pulmonary Rehab    Staff Present Leita Franks RN,BSN;Joseph [facility] BS, Exercise Physiologist;Noah Tickle, BS, Exercise Physiologist    Virtual Visit No    Medication changes reported     No    Fall or balance concerns reported    No    Warm-up and Cool-down Performed on first and last piece of equipment    Resistance Training Performed Yes    VAD Patient? No    PAD/SET Patient? No      Pain Assessment   Currently in Pain? No/denies             Tobacco Use History[1]  Goals Met:  Independence with exercise equipment Exercise tolerated well No report of concerns or symptoms today Strength training completed today  Goals Unmet:  Not Applicable  Comments: Pt able to follow exercise prescription today without complaint.  Will continue to monitor for progression.    Dr. Oneil Pinal is Medical Director for [facility] Cardiac Rehabilitation.  Dr. Fuad Aleskerov is Medical Director for [facility] Pulmonary Rehabilitation.    [1]  Social History Tobacco Use  Smoking Status Former   Current packs/day: 1.00   Average packs/day: 1 pack/day for 24.0 years (24.0 ttl pk-yrs)   Types: Cigarettes  Smokeless Tobacco Never

## 2024-07-06 ENCOUNTER — Encounter

## 2024-07-08 ENCOUNTER — Other Ambulatory Visit: Payer: Self-pay

## 2024-07-08 ENCOUNTER — Ambulatory Visit

## 2024-07-08 ENCOUNTER — Encounter: Payer: Self-pay | Admitting: *Deleted

## 2024-07-08 DIAGNOSIS — Z951 Presence of aortocoronary bypass graft: Secondary | ICD-10-CM

## 2024-07-08 DIAGNOSIS — F17210 Nicotine dependence, cigarettes, uncomplicated: Secondary | ICD-10-CM

## 2024-07-08 DIAGNOSIS — Z87891 Personal history of nicotine dependence: Secondary | ICD-10-CM

## 2024-07-08 DIAGNOSIS — Z122 Encounter for screening for malignant neoplasm of respiratory organs: Secondary | ICD-10-CM

## 2024-07-08 NOTE — Progress Notes (Signed)
 Cardiac Individual Treatment Plan  Patient Details  Name: Shaun Anderson MRN: [id number] Date of Birth: 01-28-1961 Referring Provider:   Flowsheet Row Cardiac Rehab from 04/28/2024 in [facility] Cardiac and Pulmonary Rehab  Referring Provider Dr. Cara Lovelace, MD    Initial Encounter Date:  Flowsheet Row Cardiac Rehab from 04/28/2024 in [facility] Cardiac and Pulmonary Rehab  Date 04/28/24    Visit Diagnosis: S/P CABG (coronary artery bypass graft)  Patient's Home Medications on Admission: Current Medications[1]  Past Medical History: Past Medical History:  Diagnosis Date   A-fib [facility])    CAD (coronary artery disease)    DVT (deep venous thrombosis) (HCC)    Hyperlipidemia    Hypertension    Sleep apnea    STEMI (ST elevation myocardial infarction) (HCC)    Stroke (HCC)     Tobacco Use: Tobacco Use History[2]  Labs: Review Flowsheet       Latest Ref Rng & Units 08/13/2022  Labs for ITP Cardiac and Pulmonary Rehab  Cholestrol 0 - 200 mg/dL 858   LDL (calc) 0 - 99 mg/dL 73   HDL-C >59 mg/dL 53   Trlycerides <849 mg/dL 76   Hemoglobin J8r 4.8 - 5.6 % 5.9      Exercise Target Goals: Exercise Program Goal: Individual exercise prescription set using results from initial 6 min walk test and THRR while considering  patients activity barriers and safety.   Exercise Prescription Goal: Initial exercise prescription builds to 30-45 minutes a day of aerobic activity, 2-3 days per week.  Home exercise guidelines will be given to patient during program as part of exercise prescription that the participant will acknowledge.   Education: Aerobic Exercise: - Group verbal and visual presentation on the components of exercise prescription. Introduces F.I.T.T principle from ACSM for exercise prescriptions.  Reviews F.I.T.T. principles of aerobic exercise including progression. Written material provided at class time.   Education: Resistance Exercise: - Group verbal and visual  presentation on the components of exercise prescription. Introduces F.I.T.T principle from ACSM for exercise prescriptions  Reviews F.I.T.T. principles of resistance exercise including progression. Written material provided at class time.    Education: Exercise & Equipment Safety: - Individual verbal instruction and demonstration of equipment use and safety with use of the equipment. Flowsheet Row Cardiac Rehab from 06/10/2024 in [facility] Cardiac and Pulmonary Rehab  Date 04/28/24  Educator NT  Instruction Review Code 1- Verbalizes Understanding    Education: Exercise Physiology & General Exercise Guidelines: - Group verbal and written instruction with models to review the exercise physiology of the cardiovascular system and associated critical values. Provides general exercise guidelines with specific guidelines to those with heart or lung disease. Written material provided at class time. Flowsheet Row Cardiac Rehab from 08/27/2016 in [facility] Cardiac and Pulmonary Rehab  Date 08/13/16  Educator [facility]  Instruction Review Code (retired) 2- meets goals/outcomes    Education: Flexibility, Balance, Mind/Body Relaxation: - Group verbal and visual presentation with interactive activity on the components of exercise prescription. Introduces F.I.T.T principle from ACSM for exercise prescriptions. Reviews F.I.T.T. principles of flexibility and balance exercise training including progression. Also discusses the mind body connection.  Reviews various relaxation techniques to help reduce and manage stress (i.e. Deep breathing, progressive muscle relaxation, and visualization). Balance handout provided to take home. Written material provided at class time.   Activity Barriers & Risk Stratification:  Activity Barriers & Cardiac Risk Stratification - 04/23/24 1445       Activity Barriers & Cardiac Risk Stratification  Activity Barriers None    Cardiac Risk Stratification High          6 Minute Walk:  6  Minute Walk     Row Name 04/28/24 0945         6 Minute Walk   Phase Initial     Distance 1240 feet     Walk Time 6 minutes     # of Rest Breaks 0     MPH 2.35     METS 2.82     RPE 11     Perceived Dyspnea  0     VO2 Peak 9.86     Symptoms No     Resting HR 75 bpm     Resting BP 112/64     Resting Oxygen Saturation  94 %     Exercise Oxygen Saturation  during 6 min walk 97 %     Max Ex. HR 122 bpm     Max Ex. BP 134/72     2 Minute Post BP 128/68        Oxygen Initial Assessment:   Oxygen Re-Evaluation:   Oxygen Discharge (Final Oxygen Re-Evaluation):   Initial Exercise Prescription:  Initial Exercise Prescription - 04/28/24 0900       Date of Initial Exercise RX and Referring Provider   Date 04/28/24    Referring Provider Dr. Cara Lovelace, MD      Oxygen   Maintain Oxygen Saturation 88% or higher      Treadmill   MPH 2.4    Grade 0    Minutes 15    METs 2.84      NuStep   Level 3   T6 nustep   SPM 80    Minutes 15    METs 2.82      REL-XR   Level 3    Speed 50    Minutes 15    METs 2.82      T5 Nustep   Level 2    SPM 80    Minutes 15    METs 2.82      Prescription Details   Frequency (times per week) 3    Duration Progress to 30 minutes of continuous aerobic without signs/symptoms of physical distress      Intensity   THRR 40-80% of Max Heartrate 108-141    Ratings of Perceived Exertion 11-13    Perceived Dyspnea 0-4      Progression   Progression Continue to progress workloads to maintain intensity without signs/symptoms of physical distress.      Resistance Training   Training Prescription Yes    Weight 4 lb    Reps 10-15          Perform Capillary Blood Glucose checks as needed.  Exercise Prescription Changes:   Exercise Prescription Changes     Row Name 04/28/24 0900 05/07/24 1600 05/20/24 0700 06/03/24 1500 06/08/24 0900     Response to Exercise   Blood Pressure (Admit) 112/64 130/80 92/60 114/62 --    Blood Pressure (Exercise) 134/72 142/82 144/72 132/60 --   Blood Pressure (Exit) 128/68 124/80 106/64 112/66 --   Heart Rate (Admit) 75 bpm 78 bpm 55 bpm 65 bpm --   Heart Rate (Exercise) 122 bpm 133 bpm 129 bpm 128 bpm --   Heart Rate (Exit) 96 bpm 91 bpm 90 bpm 71 bpm --   Oxygen Saturation (Admit) 94 % -- -- -- --   Oxygen Saturation (Exercise) 97 % -- -- -- --  Rating of Perceived Exertion (Exercise) 11 15 14 15  --   Perceived Dyspnea (Exercise) 0 -- 0 0 --   Symptoms none none none none --   Comments Results 1st week of exercise sessions -- -- --   Duration -- Progress to 30 minutes of  aerobic without signs/symptoms of physical distress Progress to 30 minutes of  aerobic without signs/symptoms of physical distress Progress to 30 minutes of  aerobic without signs/symptoms of physical distress Progress to 30 minutes of  aerobic without signs/symptoms of physical distress   Intensity -- THRR unchanged THRR unchanged THRR unchanged THRR unchanged     Progression   Progression -- Continue to progress workloads to maintain intensity without signs/symptoms of physical distress. Continue to progress workloads to maintain intensity without signs/symptoms of physical distress. Continue to progress workloads to maintain intensity without signs/symptoms of physical distress. Continue to progress workloads to maintain intensity without signs/symptoms of physical distress.   Average METs -- 2.94 3.33 3.19 3.19     Resistance Training   Training Prescription -- Yes Yes Yes Yes   Weight -- 4 lb 4 lb 4 lb 4 lb   Reps -- 10-15 10-15 10-15 10-15     Interval Training   Interval Training -- No No No No     Treadmill   MPH -- 2.4 2.6 2.6 2.6   Grade -- 0 -- -- --   Minutes -- 15 15 15 15    METs -- 2.84 2.99 2.99 2.99     NuStep   Level -- 3  T6 nustep 3  T6 3  T6 3  T6   Minutes -- 15 15 15 15    METs -- 4.5 4.1 3.2 3.2     REL-XR   Level -- 3 3 3 3    Minutes -- 15 15 15 15    METs --  2.4 4.8 4.3 4.3     T5 Nustep   Level -- -- 3 3 3    Minutes -- -- 15 15 15    METs -- -- 3.5 4 4      Home Exercise Plan   Plans to continue exercise at -- -- -- -- [employer] (comment)  [employer] and walking at home   Frequency -- -- -- -- Add 2 additional days to program exercise sessions.   Initial Home Exercises Provided -- -- -- -- 06/08/24     Oxygen   Maintain Oxygen Saturation -- 88% or higher 88% or higher 88% or higher 88% or higher    Row Name 06/17/24 1500 07/01/24 0900           Response to Exercise   Blood Pressure (Admit) 132/78 116/64      Blood Pressure (Exit) 104/62 118/70      Heart Rate (Admit) 69 bpm 63 bpm      Heart Rate (Exercise) 119 bpm 93 bpm      Heart Rate (Exit) 78 bpm 66 bpm      Rating of Perceived Exertion (Exercise) 15 14      Perceived Dyspnea (Exercise) 0 --      Symptoms none none      Duration Progress to 30 minutes of  aerobic without signs/symptoms of physical distress Continue with 30 min of aerobic exercise without signs/symptoms of physical distress.      Intensity THRR unchanged THRR unchanged        Progression   Progression Continue to progress workloads to maintain intensity without signs/symptoms of physical distress.  Continue to progress workloads to maintain intensity without signs/symptoms of physical distress.      Average METs 3.9 3.53        Resistance Training   Training Prescription Yes Yes      Weight 4 lb 4 lb      Reps 10-15 10-15        Interval Training   Interval Training No No        Treadmill   MPH 3.1 2.5      Grade 0 0      Minutes 15 15      METs 3.37 2.91        NuStep   Level 3  T6 --      Minutes 15 --      METs 6.6 --        REL-XR   Level 3 3      Minutes 15 15      METs 5.7 4.6        T5 Nustep   Level 3 3      Minutes 15 15      METs 4 4        Home Exercise Plan   Plans to continue exercise at [employer] (comment)  [employer] and walking at home [employer] (comment)   [employer] and walking at home      Frequency Add 2 additional days to program exercise sessions. Add 2 additional days to program exercise sessions.      Initial Home Exercises Provided 06/08/24 06/08/24        Oxygen   Maintain Oxygen Saturation 88% or higher 88% or higher         Exercise Comments:   Exercise Comments     Row Name 04/29/24 0951           Exercise Comments First full day of exercise!  Patient was oriented to gym and equipment including functions, settings, policies, and procedures.  Patient's individual exercise prescription and treatment plan were reviewed.  All starting workloads were established based on the results of the 6 minute walk test done at initial orientation visit.  The plan for exercise progression was also introduced and progression will be customized based on patient's performance and goals.          Exercise Goals and Review:   Exercise Goals     Row Name 04/28/24 0946             Exercise Goals   Increase Physical Activity Yes       Intervention Provide advice, education, support and counseling about physical activity/exercise needs.;Develop an individualized exercise prescription for aerobic and resistive training based on initial evaluation findings, risk stratification, comorbidities and participant's personal goals.       Expected Outcomes Short Term: Attend rehab on a regular basis to increase amount of physical activity.;Long Term: Add in home exercise to make exercise part of routine and to increase amount of physical activity.;Long Term: Exercising regularly at least 3-5 days a week.       Increase Strength and Stamina Yes       Intervention Provide advice, education, support and counseling about physical activity/exercise needs.;Develop an individualized exercise prescription for aerobic and resistive training based on initial evaluation findings, risk stratification, comorbidities and participant's personal goals.       Expected Outcomes  Short Term: Increase workloads from initial exercise prescription for resistance, speed, and METs.;Short Term: Perform resistance training exercises routinely during rehab and  add in resistance training at home;Long Term: Improve cardiorespiratory fitness, muscular endurance and strength as measured by increased METs and functional capacity ( )       Able to understand and use rate of perceived exertion (RPE) scale Yes       Intervention Provide education and explanation on how to use RPE scale       Expected Outcomes Short Term: Able to use RPE daily in rehab to express subjective intensity level;Long Term:  Able to use RPE to guide intensity level when exercising independently       Able to understand and use Dyspnea scale Yes       Intervention Provide education and explanation on how to use Dyspnea scale       Expected Outcomes Long Term: Able to use Dyspnea scale to guide intensity level when exercising independently;Short Term: Able to use Dyspnea scale daily in rehab to express subjective sense of shortness of breath during exertion       Knowledge and understanding of Target Heart Rate Range (THRR) Yes       Intervention Provide education and explanation of THRR including how the numbers were predicted and where they are located for reference       Expected Outcomes Short Term: Able to state/look up THRR;Long Term: Able to use THRR to govern intensity when exercising independently;Short Term: Able to use daily as guideline for intensity in rehab       Able to check pulse independently Yes       Intervention Provide education and demonstration on how to check pulse in carotid and radial arteries.;Review the importance of being able to check your own pulse for safety during independent exercise       Expected Outcomes Short Term: Able to explain why pulse checking is important during independent exercise;Long Term: Able to check pulse independently and accurately       Understanding of Exercise  Prescription Yes       Intervention Provide education, explanation, and written materials on patient's individual exercise prescription       Expected Outcomes Short Term: Able to explain program exercise prescription;Long Term: Able to explain home exercise prescription to exercise independently          Exercise Goals Re-Evaluation :  Exercise Goals Re-Evaluation     Row Name 04/29/24 0951 05/07/24 1624 05/20/24 0800 06/03/24 1554 06/08/24 0936     Exercise Goal Re-Evaluation   Exercise Goals Review Increase Physical Activity;Able to understand and use rate of perceived exertion (RPE) scale;Knowledge and understanding of Target Heart Rate Range (THRR);Understanding of Exercise Prescription;Increase Strength and Stamina;Able to understand and use Dyspnea scale;Able to check pulse independently Increase Physical Activity;Understanding of Exercise Prescription;Increase Strength and Stamina Increase Physical Activity;Understanding of Exercise Prescription;Increase Strength and Stamina Increase Physical Activity;Understanding of Exercise Prescription;Increase Strength and Stamina Increase Physical Activity;Able to understand and use rate of perceived exertion (RPE) scale;Knowledge and understanding of Target Heart Rate Range (THRR);Understanding of Exercise Prescription;Increase Strength and Stamina;Able to understand and use Dyspnea scale;Able to check pulse independently   Comments Reviewed RPE and dyspnea scale, THR and program prescription with pt today.  Pt voiced understanding and was given a copy of goals to take home. Howard is off to a good start in the program and completed his first week in this review. He worked at level 3 on the XR and T6 nustep. He had a workload on the treadmill of a speed of 2.4 mph with no incline. We will continue  to monitor his progress in the program. Nico is doing well in rehab. He has recently been able to increase his speed on the treadmill from 2.4mph to 2.7mph.  He was also able to increase from level 2 to 3 on the T5 nustep. We will continue to monitor his progress in the program. Aydrian is doing well in rehab. He has maintained level 3 on the T6 and T5 nusteps and XR. He also maintained his speed on the treadmill to 2.6 mph with no incline. We will continue to monitor his progress in the program. Reviewed home exercise with pt today.  Pt plans to walk and look into joining the [facility] for exercise.  Reviewed THR, pulse, RPE, sign and symptoms, pulse oximetery and when to call 911 or MD.  Also discussed weather considerations and indoor options.  Pt voiced understanding.   Expected Outcomes Short: Use RPE daily to regulate intensity. Long: Follow program prescription in THR. Short: Continue to follow current exercise prescription. Long: Continue exercise to improve strength and stamina. Short: Continue to increase treadmill workloads. Long: Continue exercise to improve strength and stamina. Short: Try levle 4 on the nustep and XR. Long: Continue exercise to improve strength and stamina. Short: add 1-2 days a week of exercise on off days for cardiac rehab and look into joining the [facility]. Long: maintain independent exercise routine upon graduation from cardiac rehab.    Row Name 06/17/24 1550 06/29/24 0924 07/01/24 0957         Exercise Goal Re-Evaluation   Exercise Goals Review Increase Physical Activity;Increase Strength and Stamina;Understanding of Exercise Prescription Increase Physical Activity;Increase Strength and Stamina;Improve claudication pain tolerance and improve walking ability Increase Physical Activity;Increase Strength and Stamina;Understanding of Exercise Prescription     Comments Glendell continues to make improvements in rehab. He was able to increase his speed on the treadmill from 2.6mph to 3.66mph. He was also able to maintain level 3 on the T6 nustep and level 3 on the XR. We will continue to monitor his progress in the program. Alexandar is  exercising at home or at the [facility] 1-2 days a week on off days of rehab. [facility] and [facility] at home 1 extra time a week at home. He reports that he is tolerating his independent exercise well. Keir only attended two sessions of rehab during the last review period. He decreased his speed on the treadmill from a speed of 3.1 mph to 2.5 mph with no incline. He also continues to work at level 3 on both the T5 nustep and XR. We will continue to monitor his progress in the program.     Expected Outcomes Short: Continue to increase treadmill workload. Long: Continue exercise to improve strength and stamina. Short: continue to exercise on off days of rehab. Long: maintian independent exerise routine upon graduation from cardaic rehab. Short: Attend rehab more consistently. Long: Continue exercise to improve strength and stamina.        Discharge Exercise Prescription (Final Exercise Prescription Changes):  Exercise Prescription Changes - 07/01/24 0900       Response to Exercise   Blood Pressure (Admit) 116/64    Blood Pressure (Exit) 118/70    Heart Rate (Admit) 63 bpm    Heart Rate (Exercise) 93 bpm    Heart Rate (Exit) 66 bpm    Rating of Perceived Exertion (Exercise) 14    Symptoms none    Duration Continue with 30 min of aerobic exercise  without signs/symptoms of physical distress.    Intensity THRR unchanged      Progression   Progression Continue to progress workloads to maintain intensity without signs/symptoms of physical distress.    Average METs 3.53      Resistance Training   Training Prescription Yes    Weight 4 lb    Reps 10-15      Interval Training   Interval Training No      Treadmill   MPH 2.5    Grade 0    Minutes 15    METs 2.91      REL-XR   Level 3    Minutes 15    METs 4.6      T5 Nustep   Level 3    Minutes 15    METs 4      Home Exercise Plan   Plans to continue exercise at [employer] (comment)   [employer]  and walking at home   Frequency Add 2 additional days to program exercise sessions.    Initial Home Exercises Provided 06/08/24      Oxygen   Maintain Oxygen Saturation 88% or higher          Nutrition:  Target Goals: Understanding of nutrition guidelines, daily intake of sodium 1500mg , cholesterol 200mg , calories 30% from fat and 7% or less from saturated fats, daily to have 5 or more servings of fruits and vegetables.  Education: Nutrition 1 -Group instruction provided by verbal, written material, interactive activities, discussions, models, and posters to present general guidelines for heart healthy nutrition including macronutrients, label reading, and promoting whole foods over processed counterparts. Education serves as pensions consultant of discussion of heart healthy eating for all. Written material provided at class time.    Education: Nutrition 2 -Group instruction provided by verbal, written material, interactive activities, discussions, models, and posters to present general guidelines for heart healthy nutrition including sodium, cholesterol, and saturated fat. Providing guidance of habit forming to improve blood pressure, cholesterol, and body weight. Written material provided at class time.     Biometrics:  Pre Biometrics - 04/28/24 0947       Pre Biometrics   Height 5' 8 (1.727 m)    Weight 254 lb 8 oz (115.4 kg)    Waist Circumference 48 inches    Hip Circumference 45.5 inches    Waist to Hip Ratio 1.05 %    BMI (Calculated) 38.71    Single Leg Stand 2.13 seconds           Nutrition Therapy Plan and Nutrition Goals:  Nutrition Therapy & Goals - 04/29/24 1516       Nutrition Therapy   Diet Cardiac, Low Na    Protein (specify units) 90    Fiber 30 grams    Whole Grain Foods 3 servings    Saturated Fats 15 max. grams    Fruits and Vegetables 5 servings/day    Sodium 2 grams      Personal Nutrition Goals   Nutrition Goal Read labels and reduce sodium  intake to below 2300mg . Ideally 1500mg  per day.    Personal Goal #2 Reduce saturated fat, less than 12g per day. Replace bad fats for more heart healthy fats.    Personal Goal #3 Include more colorful produce, aim for 5-8 servings of fruits and veggies per day    Comments Caylen reports he cut out soda four or five months ago when he found out he might need stents. He said he was trying  to be healthier. Commended him on this change. Educated on how cutting back on soda is overall healthy but will have the biggest impact on his A1C, which was measured at 6.0% 1 month ago. Spoke with him about heart health and how saturated fats, sodium and cholesterol were the nutrients to monitor for heart health. Reviewed his food recall and discussed the high saturated fat, sodium and cholesterol in these foods he eats often. He admits it will be a lot to change to be more heart healthy. Encouraged him to work on it and focus on improving and not meeting perfect standards before the program ends. Provided guideline limits to work towards of less than 12g saturated fat, less than 1500mg  sodium, and less than 250mg  cholesterol. Reviewed mediterranean diet handout, educated on types of fats, sources, and how to read labels.      Intervention Plan   Intervention Prescribe, educate and counsel regarding individualized specific dietary modifications aiming towards targeted core components such as weight, hypertension, lipid management, diabetes, heart failure and other comorbidities.;Nutrition handout(s) given to patient.    Expected Outcomes Short Term Goal: Understand basic principles of dietary content, such as calories, fat, sodium, cholesterol and nutrients.;Short Term Goal: A plan has been developed with personal nutrition goals set during dietitian appointment.;Long Term Goal: Adherence to prescribed nutrition plan.          Nutrition Assessments:  MEDIFICTS Score Key: >=70 Need to make dietary changes  40-70  Heart Healthy Diet <= 40 Therapeutic Level Cholesterol Diet  Flowsheet Row Cardiac Rehab from 05/04/2024 in [facility] Cardiac and Pulmonary Rehab  Picture Your Plate Total Score on Admission 36   Picture Your Plate Scores: <59 Unhealthy dietary pattern with much room for improvement. 41-50 Dietary pattern unlikely to meet recommendations for good health and room for improvement. 51-60 More healthful dietary pattern, with some room for improvement.  >60 Healthy dietary pattern, although there may be some specific behaviors that could be improved.    Nutrition Goals Re-Evaluation:  Nutrition Goals Re-Evaluation     Row Name 05/20/24 0958 06/29/24 0928           Goals   Current Weight 243 lb (110.2 kg) --      Nutrition Goal -- Read labels and reduce sodium intake to below 2300mg . Ideally 1500mg  per day.      Comment Concepcion has been cutting back on sweets and does not snack after eight pm. He states he used to drink alot of chocolate milk and has then cut back . He is ready to get lose some weight and is going to cut back a little more and make healthier food choices. Jermani continues to cut back on his chocolate milk. He sometimes goes days without any. He does drink at least a glass of milk a day. He report that he has gotten comfortable with reading food labels and reducing sodium and saturated fat. He has also been eating more fresh produce with no salt added.      Expected Outcome Short: make healthier food choices. Long: lose some weight. Short: continue to read food labels. Long: maintain heart healthy diet long term.        Personal Goal #2 Re-Evaluation   Personal Goal #2 -- Reduce saturated fat, less than 12g per day. Replace bad fats for more heart healthy fats.        Personal Goal #3 Re-Evaluation   Personal Goal #3 -- Include more colorful produce, aim for 5-8 servings of  fruits and veggies per day         Nutrition Goals Discharge (Final Nutrition Goals Re-Evaluation):   Nutrition Goals Re-Evaluation - 06/29/24 9071       Goals   Nutrition Goal Read labels and reduce sodium intake to below 2300mg . Ideally 1500mg  per day.    Comment Tavone continues to cut back on his chocolate milk. He sometimes goes days without any. He does drink at least a glass of milk a day. He report that he has gotten comfortable with reading food labels and reducing sodium and saturated fat. He has also been eating more fresh produce with no salt added.    Expected Outcome Short: continue to read food labels. Long: maintain heart healthy diet long term.      Personal Goal #2 Re-Evaluation   Personal Goal #2 Reduce saturated fat, less than 12g per day. Replace bad fats for more heart healthy fats.      Personal Goal #3 Re-Evaluation   Personal Goal #3 Include more colorful produce, aim for 5-8 servings of fruits and veggies per day          Psychosocial: Target Goals: Acknowledge presence or absence of significant depression and/or stress, maximize coping skills, provide positive support system. Participant is able to verbalize types and ability to use techniques and skills needed for reducing stress and depression.   Education: Stress, Anxiety, and Depression - Group verbal and visual presentation to define topics covered.  Reviews how body is impacted by stress, anxiety, and depression.  Also discusses healthy ways to reduce stress and to treat/manage anxiety and depression. Written material provided at class time. Flowsheet Row Cardiac Rehab from 08/27/2016 in [facility] Cardiac and Pulmonary Rehab  Date 08/01/16  Educator [facility]  Instruction Review Code (retired) 2- meets goals/outcomes    Education: Sleep Hygiene -Provides group verbal and written instruction about how sleep can affect your health.  Define sleep hygiene, discuss sleep cycles and impact of sleep habits. Review good sleep hygiene tips.   Initial Review & Psychosocial Screening:  Initial Psych Review & Screening -  04/23/24 1449       Initial Review   Current issues with None Identified      Family Dynamics   Good Support System? 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 He is now home with his wife and states he is feeling well. He reports that he has had decreased appetite and changes in the taste of food, but attributes this to medication changes, recent intubation, and the fact that he stopped smoking 5 months ago. Daiel reports that he is ready to get back to his active lifestyle. He does admit to episodes of feeling down or increased stress, however states that his wife and son offer a good support system and he enjoys walking his dog when he starts to feel this way. He is looking forward to starting the program again and verbalized understanding of the need to be vocal about how he was feeling and if anything was causing discomfort, specifically at his incision.      Barriers   Psychosocial barriers to participate in program The patient should benefit from training in stress management and relaxation.      Screening Interventions   Interventions Encouraged to exercise;To provide support and resources with identified psychosocial needs    Expected Outcomes Short Term goal: Utilizing psychosocial counselor, staff and physician to assist with  identification of specific Stressors or current issues interfering with healing process. Setting desired goal for each stressor or current issue identified.;Long Term Goal: Stressors or current issues are controlled or eliminated.;Short Term goal: Identification and review with participant of any Quality of Life or Depression concerns found by scoring the questionnaire.;Long Term goal: The participant improves quality of Life and PHQ9 Scores as seen by post scores and/or verbalization of changes          Quality of Life Scores:   Quality of Life - 05/04/24 1350       Quality of Life    Select Quality of Life      Quality of Life Scores   Health/Function Pre 26 %    Socioeconomic Pre 24.64 %    Psych/Spiritual Pre 26.57 %    Family Pre 30 %    GLOBAL Pre 26.43 %         Scores of 19 and below usually indicate a poorer quality of life in these areas.  A difference of  2-3 points is a clinically meaningful difference.  A difference of 2-3 points in the total score of the Quality of Life Index has been associated with significant improvement in overall quality of life, self-image, physical symptoms, and general health in studies assessing change in quality of life.  PHQ-9: Review Flowsheet       06/29/2024 05/20/2024 04/28/2024 07/30/2016  Depression screen PHQ 2/9  Decreased Interest 0 2 1 0  Down, Depressed, Hopeless 0 2 1 0  PHQ - 2 Score 0 4 2 0  Altered sleeping 1 1 2 1   Tired, decreased energy 1 1 1 1   Change in appetite 0 2 1 1   Feeling bad or failure about yourself  0 0 0 0  Trouble concentrating 1 0 0 0  Moving slowly or fidgety/restless 0 0 0 0  Suicidal thoughts 0 0 0 0   PHQ-9 Score 3 8  6  3    Difficult doing work/chores Not difficult at all Somewhat difficult Not difficult at all Not difficult at all    Details       Data saved with a previous flowsheet row definition        Interpretation of Total Score  Total Score Depression Severity:  1-4 = Minimal depression, 5-9 = Mild depression, 10-14 = Moderate depression, 15-19 = Moderately severe depression, 20-27 = Severe depression   Psychosocial Evaluation and Intervention:  Psychosocial Evaluation - 04/23/24 1452       Psychosocial Evaluation & Interventions   Interventions Encouraged to exercise with the program and follow exercise prescription;Relaxation education;Stress management education    Comments Dewitt 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 He is now home with his wife and states he is feeling well. He reports that he has had decreased appetite  and changes in the taste of food, but attributes this to medication changes, recent intubation, and the fact that he stopped smoking 5 months ago. Gee reports that he is ready to get back to his active lifestyle. He does admit to episodes of feeling down or increased stress, however states that his wife and son offer a good support system and he enjoys walking his dog when he starts to feel this way. He is looking forward to starting the program again and verbalized understanding of the need to be vocal about how he was feeling and if anything was causing discomfort, specifically at his incision.    Expected  Outcomes Short: Attend cardiac rehab for education and exercise. Long: Develop and maintain positive self care habits.    Continue Psychosocial Services  Follow up required by staff          Psychosocial Re-Evaluation:  Psychosocial Re-Evaluation     Row Name 05/20/24 1005 06/29/24 9061           Psychosocial Re-Evaluation   Current issues with Current Stress Concerns None Identified      Comments Reviewed patient health questionnaire (PHQ-9) with patient for follow up. Previously, patients score indicated signs/symptoms of depression.  Reviewed to see if patient is improving symptom wise while in program.  Score declined and patient states that it is because he has had some stressors at home and is not able to do what he wants to do yet. Pepe reports that he does not have any sleep, stress, or mental health concerns. He reports he has been feeling physically better and more like himself. His PHQ was re-evaluated and his score went from an 8 to a 3, showing improvment.      Expected Outcomes Short: Continue to work toward an improvement in PHQ9 scores by attending [facility] regularly. Long: Continue to improve stress and depression coping skills by talking with staff and attending [facility] regularly and work toward a positive mental state. Short: continue to attend cardiac rehab  for the mental health benefits of exercise. Long: maintain good mental health routine.      Interventions Encouraged to attend Cardiac Rehabilitation for the exercise --      Continue Psychosocial Services  Follow up required by staff Follow up required by staff         Psychosocial Discharge (Final Psychosocial Re-Evaluation):  Psychosocial Re-Evaluation - 06/29/24 9061       Psychosocial Re-Evaluation   Current issues with None Identified    Comments Bay reports that he does not have any sleep, stress, or mental health concerns. He reports he has been feeling physically better and more like himself. His PHQ was re-evaluated and his score went from an 8 to a 3, showing improvment.    Expected Outcomes Short: continue to attend cardiac rehab for the mental health benefits of exercise. Long: maintain good mental health routine.    Continue Psychosocial Services  Follow up required by staff          Vocational Rehabilitation: Provide vocational rehab assistance to qualifying candidates.   Vocational Rehab Evaluation & Intervention:  Vocational Rehab - 04/23/24 1449       Initial Vocational Rehab Evaluation & Intervention   Assessment shows need for Vocational Rehabilitation No   patient is retired         Education: Education Goals: Education classes will be provided on a variety of topics geared toward better understanding of heart health and risk factor modification. Participant will state understanding/return demonstration of topics presented as noted by education test scores.  Learning Barriers/Preferences:  Learning Barriers/Preferences - 04/23/24 1449       Learning Barriers/Preferences   Learning Barriers None    Learning Preferences None          General Cardiac Education Topics:  AED/CPR: - Group verbal and written instruction with the use of models to demonstrate the basic use of the AED with the basic ABC's of resuscitation.   Test and  Procedures: - Group verbal and visual presentation and models provide information about basic cardiac anatomy and function. Reviews the testing methods done to diagnose heart disease and  the outcomes of the test results. Describes the treatment choices: Medical Management, Angioplasty, or Coronary Bypass Surgery for treating various heart conditions including Myocardial Infarction, Angina, Valve Disease, and Cardiac Arrhythmias. Written material provided at class time. Flowsheet Row Cardiac Rehab from 08/27/2016 in [facility] Cardiac and Pulmonary Rehab  Date 08/27/16  Educator MA  Instruction Review Code (retired) 2- meets goals/outcomes    Medication Safety: - Group verbal and visual instruction to review commonly prescribed medications for heart and lung disease. Reviews the medication, class of the drug, and side effects. Includes the steps to properly store meds and maintain the prescription regimen. Written material provided at class time.   Intimacy: - Group verbal instruction through game format to discuss how heart and lung disease can affect sexual intimacy. Written material provided at class time.   Know Your Numbers and Heart Failure: - Group verbal and visual instruction to discuss disease risk factors for cardiac and pulmonary disease and treatment options.  Reviews associated critical values for Overweight/Obesity, Hypertension, Cholesterol, and Diabetes.  Discusses basics of heart failure: signs/symptoms and treatments.  Introduces Heart Failure Zone chart for action plan for heart failure. Written material provided at class time. Flowsheet Row Cardiac Rehab from 08/27/2016 in [facility] Cardiac and Pulmonary Rehab  Date 08/27/16  Educator MA  Instruction Review Code (retired) 2- meets goals/outcomes    Infection Prevention: - Provides verbal and written material to individual with discussion of infection control including proper hand washing and proper equipment cleaning during exercise  session. Flowsheet Row Cardiac Rehab from 06/10/2024 in [facility] Cardiac and Pulmonary Rehab  Date 04/28/24  Educator NT  Instruction Review Code 1- Verbalizes Understanding    Falls Prevention: - Provides verbal and written material to individual with discussion of falls prevention and safety. Flowsheet Row Cardiac Rehab from 06/10/2024 in [facility] Cardiac and Pulmonary Rehab  Date 04/28/24  Educator NT  Instruction Review Code 1- Verbalizes Understanding    Other: -Provides group and verbal instruction on various topics (see comments)   Knowledge Questionnaire Score:  Knowledge Questionnaire Score - 05/04/24 1351       Knowledge Questionnaire Score   Pre Score 23/26          Core Components/Risk Factors/Patient Goals at Admission:  Personal Goals and Risk Factors at Admission - 04/23/24 1447       Core Components/Risk Factors/Patient Goals on Admission    Weight Management Yes;Weight Loss    Intervention Weight Management: Develop a combined nutrition and exercise program designed to reach desired caloric intake, while maintaining appropriate intake of nutrient and fiber, sodium and fats, and appropriate energy expenditure required for the weight goal.;Weight Management: Provide education and appropriate resources to help participant work on and attain dietary goals.;Weight Management/Obesity: Establish reasonable short term and long term weight goals.;Obesity: Provide education and appropriate resources to help participant work on and attain dietary goals.    Goal Weight: Long Term 225 lb (102.1 kg)    Expected Outcomes Short Term: Continue to assess and modify interventions until short term weight is achieved;Weight Loss: Understanding of general recommendations for a balanced deficit meal plan, which promotes 1-2 lb weight loss per week and includes a negative energy balance of [id number] kcal/d;Long Term: Adherence to nutrition and physical activity/exercise program aimed toward  attainment of established weight goal;Understanding recommendations for meals to include 15-35% energy as protein, 25-35% energy from fat, 35-60% energy from carbohydrates, less than 200mg  of dietary cholesterol, 20-35 gm of total fiber daily;Understanding of distribution of calorie  intake throughout the day with the consumption of 4-5 meals/snacks    Hypertension Yes    Intervention Provide education on lifestyle modifcations including regular physical activity/exercise, weight management, moderate sodium restriction and increased consumption of fresh fruit, vegetables, and low fat dairy, alcohol moderation, and smoking cessation.;Monitor prescription use compliance.    Expected Outcomes Short Term: Continued assessment and intervention until BP is < 140/11mm HG in hypertensive participants. < 130/29mm HG in hypertensive participants with diabetes, heart failure or chronic kidney disease.;Long Term: Maintenance of blood pressure at goal levels.    Lipids Yes    Intervention Provide education and support for participant on nutrition & aerobic/resistive exercise along with prescribed medications to achieve LDL 70mg , HDL >40mg .    Expected Outcomes Short Term: Participant states understanding of desired cholesterol values and is compliant with medications prescribed. Participant is following exercise prescription and nutrition guidelines.;Long Term: Cholesterol controlled with medications as prescribed, with individualized exercise RX and with personalized nutrition plan. Value goals: LDL < 70mg , HDL > 40 mg.          Education:Diabetes - Individual verbal and written instruction to review signs/symptoms of diabetes, desired ranges of glucose level fasting, after meals and with exercise. Acknowledge that pre and post exercise glucose checks will be done for 3 sessions at entry of program.   Core Components/Risk Factors/Patient Goals Review:   Goals and Risk Factor Review     Row Name 05/20/24 9047  06/29/24 0931           Core Components/Risk Factors/Patient Goals Review   Personal Goals Review Weight Management/Obesity Weight Management/Obesity;Lipids;Hypertension      Review Jubal wants to reach a weight goal of 225 pounds. His current current weight is at 243 pounds. He is cutting back on sweets and will monitor his weight at home. Trellis states that he does weigh most days and he is working with his doctor to manage his fluid levels. His weight changes have mostly been due to fluid but he has been working closely with his doctor to stablize this. He checks blood pressure at home and reports that he takes all BP and cholesterol meds. He follows up regularly for lab work with his doctor to monitor his cholesterol.      Expected Outcomes Short: lose a few pounds in the next couple weeks. Long: reach weight goal. Short: get fluid levles and weight more stablized. Long: control cardiac risk factors.         Core Components/Risk Factors/Patient Goals at Discharge (Final Review):   Goals and Risk Factor Review - 06/29/24 0931       Core Components/Risk Factors/Patient Goals Review   Personal Goals Review Weight Management/Obesity;Lipids;Hypertension    Review Nirav states that he does weigh most days and he is working with his doctor to manage his fluid levels. His weight changes have mostly been due to fluid but he has been working closely with his doctor to stablize this. He checks blood pressure at home and reports that he takes all BP and cholesterol meds. He follows up regularly for lab work with his doctor to monitor his cholesterol.    Expected Outcomes Short: get fluid levles and weight more stablized. Long: control cardiac risk factors.          ITP Comments:  ITP Comments     Row Name 04/23/24 1444 04/28/24 0943 04/29/24 0951 05/13/24 0937 06/10/24 1116   ITP Comments Initial phone call completed. Diagnosis can be found in [facility] 03/16/2024.  EP Orientation scheduled for  Tuesday 10/7 at 8:00. Completed and gym orientation for cardiac rehab. Initial ITP created and sent for review to Dr. Oneil Pinal, Medical Director. First full day of exercise!  Patient was oriented to gym and equipment including functions, settings, policies, and procedures.  Patient's individual exercise prescription and treatment plan were reviewed.  All starting workloads were established based on the results of the 6 minute walk test done at initial orientation visit.  The plan for exercise progression was also introduced and progression will be customized based on patient's performance and goals. 30 Day review completed. Medical Director ITP review done, changes made as directed, and signed approval by Medical Director. 30 Day review completed. Medical Director ITP review done, changes made as directed, and signed approval by Medical Director.    Row Name 07/08/24 1313           ITP Comments 30 Day review completed. Medical Director ITP review done, changes made as directed, and signed approval by Medical Director.          Comments: 30 Day Review    [1]  Current Outpatient Medications:    allopurinol (ZYLOPRIM) 100 MG tablet, Take 100 mg by mouth daily., Disp: , Rfl:    amiodarone (PACERONE) 200 MG tablet, Take 200 mg by mouth daily., Disp: , Rfl:    amLODipine (NORVASC) 2.5 MG tablet, Take 2.5 mg by mouth., Disp: , Rfl:    apixaban  (ELIQUIS ) 5 MG TABS tablet, Take 5 mg by mouth 2 (two) times daily., Disp: , Rfl:    aspirin  EC 81 MG tablet, Take 81 mg by mouth daily., Disp: , Rfl:    atorvastatin  (LIPITOR) 80 MG tablet, Take 1 tablet (80 mg total) by mouth daily., Disp: 90 tablet, Rfl: 4   Cholecalciferol 50 MCG (2000 UT) CAPS, Take 2,000 Units by mouth daily., Disp: , Rfl:    empagliflozin (JARDIANCE) 10 MG TABS tablet, Take 10 mg by mouth daily., Disp: , Rfl:    ezetimibe  (ZETIA ) 10 MG tablet, Take 1 tablet (10 mg total) by mouth daily., Disp: 30 tablet, Rfl: 2   fluticasone  (FLONASE) 50 MCG/ACT nasal spray, Place 1 spray into the nose daily., Disp: , Rfl:    fluticasone-salmeterol (ADVAIR) 250-50 MCG/ACT AEPB, Inhale 1 puff into the lungs 2 (two) times daily., Disp: , Rfl:    furosemide (LASIX) 40 MG tablet, Take 40 mg by mouth daily., Disp: , Rfl:    ipratropium (ATROVENT) 0.03 % nasal spray, Place 1 spray into the nose daily as needed for rhinitis., Disp: , Rfl:    metoprolol  tartrate (LOPRESSOR ) 25 MG tablet, Take 12.5 mg by mouth 2 (two) times daily., Disp: , Rfl:    NON FORMULARY, Pt uses a cpap nightly, Disp: , Rfl:  [2]  Social History Tobacco Use  Smoking Status Former   Current packs/day: 1.00   Average packs/day: 1 pack/day for 24.0 years (24.0 ttl pk-yrs)   Types: Cigarettes  Smokeless Tobacco Never

## 2024-07-09 LAB — ECHO TEE

## 2024-07-09 NOTE — Progress Notes (Signed)
 Established Patient Visit   Chief Complaint: Atrial fibrillation, heart failure with reduced EF  Date of Service: 07/09/2024 Date of Birth: December 30, 1960 PCP: Sadie Tamra Cal, MD  History of Present Illness: Shaun Anderson is a 63 y.o.male patient who presented for atrial fibrillation, heart failure with reduced EF  Past medical history significant for CAD with prior multivessel PCI 2017, status post two-vessel CABG with LIMA to LAD, right radial to OM1 02/2024, heart failure with reduced EF, persistent atrial fibrillation, prior stroke, history of DVT, hypertension, hyperlipidemia, OSA, prior tobacco use.  Had successful TEE/cardioversion 05/2024 when LVEF was 35%.  Not on much GDMT which was discontinued with prior hypotension issues.  Today patient details that he is doing well.  No complaints of chest pain/pressure, increased shortness of breath.  No palpitation, dizziness or syncope.  Regular rhythm on exam today  Past Medical and Surgical History  Past Medical History Past Medical History:  Diagnosis Date   Acute respiratory failure requiring intubation 03/18/2024   Angina decubitus () 04/26/2016   Arthritis    Benign essential HTN 08/06/2016   Congenital heart disease (HHS-HCC)    Coronary artery disease 2017   Former tobacco use 03/17/2024   History of DVT (deep vein thrombosis) 04/19/2016   History of stroke 08/12/2022   History of substance use 03/17/2024   Previous cocaine use    MI (myocardial infarction) (CMS/HHS-HCC)    Mixed hyperlipidemia 04/25/2016   Polycythemia 04/19/2016   Sleep apnea    Tobacco user 04/19/2016   Vitamin D deficiency 03/22/2024    Past Surgical History He has a past surgical history that includes Transcath Placement Intravascular Stent Chest (05/2016); Colonoscopy (09/29/2018); Colon @ [facility] (09/09/2023); Coronary angioplasty (2017); coronary artery bypass w/single artery graft (N/A, 03/17/2024); endoscopic harvest uxtr  artery 1 segment cab px  (Right, 03/17/2024); insertion cannulas for extracorporeal circulation (Bilateral, 03/18/2024); and other surgery (03/17/2024).   Medications and Allergies  Current Medications  Current Outpatient Medications  Medication Sig Dispense Refill   allopurinoL (ZYLOPRIM) 100 MG tablet Take 1 tablet by mouth once daily 90 tablet 0   AMIOdarone (PACERONE) 200 MG tablet Take 1 tablet (200 mg total) by mouth once daily 90 tablet 3   apixaban  (ELIQUIS ) 5 mg tablet Take 1 tablet (5 mg total) by mouth every 12 (twelve) hours 60 tablet 11   aspirin  81 MG EC tablet Take 81 mg by mouth once daily     atorvastatin  (LIPITOR) 80 MG tablet Take 1 tablet by mouth once daily 90 tablet 0   cholecalciferol (VITAMIN D3) 2,000 unit capsule Take 2,000 Units by mouth once daily     ezetimibe  (ZETIA ) 10 mg tablet Take 1 tablet by mouth once daily 30 tablet 0   fluticasone propion-salmeteroL (ADVAIR DISKUS) 250-50 mcg/dose diskus inhaler Inhale 1 Puff into the lungs every 12 (twelve) hours 60 each 3   fluticasone propionate (FLONASE) 50 mcg/actuation nasal spray Place 1 spray into both nostrils once daily 16 g 11   FUROsemide (LASIX) 40 MG tablet Take 1 tablet (40 mg total) by mouth once daily 90 tablet 3   ipratropium (ATROVENT) 21 mcg (0.03 %) nasal spray Place 1 spray into both nostrils 2 (two) times daily as needed for Rhinitis 30 mL 11   metoprolol  SUCCinate (TOPROL -XL) 25 MG XL tablet Take 0.5 tablets (12.5 mg total) by mouth once daily 45 tablet 3   polyethylene glycol (MIRALAX) packet Take 1 packet (17 g total) by mouth 2 (two) times daily Mix in  4-8ounces of fluid prior to taking. (Patient taking differently: Take 17 g by mouth as needed for Constipation Mix in 4-8ounces of fluid prior to taking.) 60 packet 0   empagliflozin (JARDIANCE) 10 mg tablet Take 1 tablet (10 mg total) by mouth once daily (Patient not taking: Reported on 07/09/2024) 30 tablet 6   losartan (COZAAR) 25  MG tablet Take 0.5 tablets (12.5 mg total) by mouth once daily 15 tablet 11   predniSONE (DELTASONE) 20 MG tablet Take 1 tablet (20 mg total) by mouth every morning (Patient not taking: Reported on 07/09/2024) 5 tablet 0   sennosides-docusate (SENOKOT-S) 8.6-50 mg tablet Take 2 tablets by mouth 2 (two) times daily (Patient not taking: Reported on 07/09/2024) 120 tablet 0   No current facility-administered medications for this visit.    Allergies: Lisinopril  Social and Family History  Social History  reports that he quit smoking about 8 months ago. His smoking use included cigarettes. He started smoking about 39 years ago. He has a 39.2 pack-year smoking history. He has been exposed to tobacco smoke. He has never used smokeless tobacco. He reports that he does not currently use alcohol after a past usage of about 16.0 - 24.0 standard drinks of alcohol per week. He reports that he does not use drugs.  Family History Family History  Problem Relation Name Age of Onset   Coronary Artery Disease (Blocked arteries around heart) Mother Ezrael Sam 60   Atrial fibrillation (Abnormal heart rhythm sometimes requiring treatment with blood thinners) Mother Emon Lance    Heart failure Father     Cancer Brother     Cancer Brother     Heart failure Paternal Uncle      Review of Systems   Review of Systems: The patient denies chest pain, shortness of breath, orthopnea, paroxysmal nocturnal dyspnea, pedal edema, palpitations, heart racing, presyncope, syncope.   Physical Examination   Vitals:BP 110/80 (BP Location: Left upper arm, Patient Position: Sitting, BP Cuff Size: Large Adult)   Pulse 72   Resp 13   Ht 170.2 cm (5' 7)   Wt (!) 115.7 kg (255 lb)   SpO2 95%   BMI 39.94 kg/m  Ht:170.2 cm (5' 7) Wt:(!) 115.7 kg (255 lb) ADJ:Anib surface area is 2.34 meters squared. Body mass index is 39.94 kg/m.  HEENT: Pupils equally reactive to light and accomodation  Neck: Supple, no  significant JVD Lungs: clear to auscultation bilaterally; no wheezes, rales, rhonchi Heart: Regular rate and rhythm. No murmur Extremities: no pedal edema  Assessment and Plan   63 y.o. male with  CAD status post multivessel PCI 2017, two-vessel CABG 02/2024 Heart failure with reduced EF, LVEF 35% Persistent atrial fibrillation status post cardioversion 01/2024, now in sinus rhythm Hypertension Hyperlipidemia History of DVT Prior stroke OSA, prior tobacco use  Stable from cardiac standpoint without any anginal symptoms.  Euvolemic on exam. Continue p.o. Lasix. He is not on much GDMT for heart failure which was discontinued in the past with hypotension issues. Continue low-dose Toprol -XL.  Will stop low-dose amlodipine and start low-dose losartan. Echo before next visit and decide on further GDMT. Low-salt diet and fluid restriction.  Regarding atrial fibrillation, currently regular rhythm on exam. Continue p.o. amiodarone.  If he continues to be in sinus rhythm in future and based on improvement in LVEF, will consider to come off amiodarone in follow-up. Continue Eliquis  for anticoagulation.  He has no anginal symptoms.  Continue statin and Zetia .  Orders Placed This Encounter  Procedures   Echo complete    Return in about 3 months (around 10/07/2024).  Shaun CHAITANYA ALLURI, MD  This dictation was prepared with dragon dictation. Any transcription errors that result from this process are unintentional.

## 2024-07-10 ENCOUNTER — Ambulatory Visit

## 2024-07-13 ENCOUNTER — Ambulatory Visit

## 2024-07-15 ENCOUNTER — Encounter

## 2024-07-20 ENCOUNTER — Telehealth: Payer: Self-pay

## 2024-07-20 ENCOUNTER — Ambulatory Visit

## 2024-07-20 NOTE — Telephone Encounter (Signed)
 Phone call attempts made related to absences from rehab. 1st attempt to [phone]: no answer, no VMM left; 2nd attempt to [phone], no answer, LVMM requesting callback and callback number provided.

## 2024-07-22 ENCOUNTER — Ambulatory Visit

## 2024-07-24 ENCOUNTER — Encounter

## 2024-07-27 ENCOUNTER — Encounter

## 2024-07-27 ENCOUNTER — Telehealth: Payer: Self-pay

## 2024-07-27 NOTE — Telephone Encounter (Signed)
 No answer, left voicemail informing him that a discharge letter has been sent and he will be discharged in 7days time, if he cannot attend or communicate his absence to rehab team

## 2024-07-29 ENCOUNTER — Encounter

## 2024-08-04 DIAGNOSIS — Z951 Presence of aortocoronary bypass graft: Secondary | ICD-10-CM

## 2024-08-04 NOTE — Progress Notes (Signed)
 Early Discharge Summary  MAKAR SLATTER 1960-10-09  [facility]zzie is discharging early due to lack of attendance. He completed 26 of 36 sessions.    6 Minute Walk     Row Name 04/28/24 0945         6 Minute Walk   Phase Initial     Distance 1240 feet     Walk Time 6 minutes     # of Rest Breaks 0     MPH 2.35     METS 2.82     RPE 11     Perceived Dyspnea  0     VO2 Peak 9.86     Symptoms No     Resting HR 75 bpm     Resting BP 112/64     Resting Oxygen Saturation  94 %     Exercise Oxygen Saturation  during 6 min walk 97 %     Max Ex. HR 122 bpm     Max Ex. BP 134/72     2 Minute Post BP 128/68

## 2024-08-04 NOTE — Progress Notes (Signed)
 Cardiac Individual Treatment Plan  Patient Details  Name: Shaun Anderson MRN: [id number] Date of Birth: 1961-05-27 Referring Provider:   Flowsheet Row Cardiac Rehab from 04/28/2024 in [facility] Cardiac and Pulmonary Rehab  Referring Provider Dr. Cara Lovelace, MD    Initial Encounter Date:  Flowsheet Row Cardiac Rehab from 04/28/2024 in [facility] Cardiac and Pulmonary Rehab  Date 04/28/24    Visit Diagnosis: S/P CABG (coronary artery bypass graft)  Patient's Home Medications on Admission: Current Medications[1]  Past Medical History: Past Medical History:  Diagnosis Date   A-fib [facility])    CAD (coronary artery disease)    DVT (deep venous thrombosis) (HCC)    Hyperlipidemia    Hypertension    Sleep apnea    STEMI (ST elevation myocardial infarction) (HCC)    Stroke (HCC)     Tobacco Use: Tobacco Use History[2]  Labs: Review Flowsheet       Latest Ref Rng & Units 08/13/2022  Labs for ITP Cardiac and Pulmonary Rehab  Cholestrol 0 - 200 mg/dL 858   LDL (calc) 0 - 99 mg/dL 73   HDL-C >59 mg/dL 53   Trlycerides <849 mg/dL 76   Hemoglobin J8r 4.8 - 5.6 % 5.9      Exercise Target Goals: Exercise Program Goal: Individual exercise prescription set using results from initial 6 min walk test and THRR while considering  patients activity barriers and safety.   Exercise Prescription Goal: Initial exercise prescription builds to 30-45 minutes a day of aerobic activity, 2-3 days per week.  Home exercise guidelines will be given to patient during program as part of exercise prescription that the participant will acknowledge.   Education: Aerobic Exercise: - Group verbal and visual presentation on the components of exercise prescription. Introduces F.I.T.T principle from ACSM for exercise prescriptions.  Reviews F.I.T.T. principles of aerobic exercise including progression. Written material provided at class time.   Education: Resistance Exercise: - Group verbal and visual  presentation on the components of exercise prescription. Introduces F.I.T.T principle from ACSM for exercise prescriptions  Reviews F.I.T.T. principles of resistance exercise including progression. Written material provided at class time.    Education: Exercise & Equipment Safety: - Individual verbal instruction and demonstration of equipment use and safety with use of the equipment. Flowsheet Row Cardiac Rehab from 06/10/2024 in [facility] Cardiac and Pulmonary Rehab  Date 04/28/24  Educator NT  Instruction Review Code 1- Verbalizes Understanding    Education: Exercise Physiology & General Exercise Guidelines: - Group verbal and written instruction with models to review the exercise physiology of the cardiovascular system and associated critical values. Provides general exercise guidelines with specific guidelines to those with heart or lung disease. Written material provided at class time. Flowsheet Row Cardiac Rehab from 08/27/2016 in [facility] Cardiac and Pulmonary Rehab  Date 08/13/16  Educator [facility]  Instruction Review Code (retired) 2- meets goals/outcomes    Education: Flexibility, Balance, Mind/Body Relaxation: - Group verbal and visual presentation with interactive activity on the components of exercise prescription. Introduces F.I.T.T principle from ACSM for exercise prescriptions. Reviews F.I.T.T. principles of flexibility and balance exercise training including progression. Also discusses the mind body connection.  Reviews various relaxation techniques to help reduce and manage stress (i.e. Deep breathing, progressive muscle relaxation, and visualization). Balance handout provided to take home. Written material provided at class time.   Activity Barriers & Risk Stratification:  Activity Barriers & Cardiac Risk Stratification - 04/23/24 1445       Activity Barriers & Cardiac Risk Stratification  Activity Barriers None    Cardiac Risk Stratification High          6 Minute Walk:  6  Minute Walk     Row Name 04/28/24 0945         6 Minute Walk   Phase Initial     Distance 1240 feet     Walk Time 6 minutes     # of Rest Breaks 0     MPH 2.35     METS 2.82     RPE 11     Perceived Dyspnea  0     VO2 Peak 9.86     Symptoms No     Resting HR 75 bpm     Resting BP 112/64     Resting Oxygen Saturation  94 %     Exercise Oxygen Saturation  during 6 min walk 97 %     Max Ex. HR 122 bpm     Max Ex. BP 134/72     2 Minute Post BP 128/68        Oxygen Initial Assessment:   Oxygen Re-Evaluation:   Oxygen Discharge (Final Oxygen Re-Evaluation):   Initial Exercise Prescription:  Initial Exercise Prescription - 04/28/24 0900       Date of Initial Exercise RX and Referring Provider   Date 04/28/24    Referring Provider Dr. Cara Lovelace, MD      Oxygen   Maintain Oxygen Saturation 88% or higher      Treadmill   MPH 2.4    Grade 0    Minutes 15    METs 2.84      NuStep   Level 3   T6 nustep   SPM 80    Minutes 15    METs 2.82      REL-XR   Level 3    Speed 50    Minutes 15    METs 2.82      T5 Nustep   Level 2    SPM 80    Minutes 15    METs 2.82      Prescription Details   Frequency (times per week) 3    Duration Progress to 30 minutes of continuous aerobic without signs/symptoms of physical distress      Intensity   THRR 40-80% of Max Heartrate 108-141    Ratings of Perceived Exertion 11-13    Perceived Dyspnea 0-4      Progression   Progression Continue to progress workloads to maintain intensity without signs/symptoms of physical distress.      Resistance Training   Training Prescription Yes    Weight 4 lb    Reps 10-15          Perform Capillary Blood Glucose checks as needed.  Exercise Prescription Changes:   Exercise Prescription Changes     Row Name 04/28/24 0900 05/07/24 1600 05/20/24 0700 06/03/24 1500 06/08/24 0900     Response to Exercise   Blood Pressure (Admit) 112/64 130/80 92/60 114/62 --    Blood Pressure (Exercise) 134/72 142/82 144/72 132/60 --   Blood Pressure (Exit) 128/68 124/80 106/64 112/66 --   Heart Rate (Admit) 75 bpm 78 bpm 55 bpm 65 bpm --   Heart Rate (Exercise) 122 bpm 133 bpm 129 bpm 128 bpm --   Heart Rate (Exit) 96 bpm 91 bpm 90 bpm 71 bpm --   Oxygen Saturation (Admit) 94 % -- -- -- --   Oxygen Saturation (Exercise) 97 % -- -- -- --  Rating of Perceived Exertion (Exercise) 11 15 14 15  --   Perceived Dyspnea (Exercise) 0 -- 0 0 --   Symptoms none none none none --   Comments Results 1st week of exercise sessions -- -- --   Duration -- Progress to 30 minutes of  aerobic without signs/symptoms of physical distress Progress to 30 minutes of  aerobic without signs/symptoms of physical distress Progress to 30 minutes of  aerobic without signs/symptoms of physical distress Progress to 30 minutes of  aerobic without signs/symptoms of physical distress   Intensity -- THRR unchanged THRR unchanged THRR unchanged THRR unchanged     Progression   Progression -- Continue to progress workloads to maintain intensity without signs/symptoms of physical distress. Continue to progress workloads to maintain intensity without signs/symptoms of physical distress. Continue to progress workloads to maintain intensity without signs/symptoms of physical distress. Continue to progress workloads to maintain intensity without signs/symptoms of physical distress.   Average METs -- 2.94 3.33 3.19 3.19     Resistance Training   Training Prescription -- Yes Yes Yes Yes   Weight -- 4 lb 4 lb 4 lb 4 lb   Reps -- 10-15 10-15 10-15 10-15     Interval Training   Interval Training -- No No No No     Treadmill   MPH -- 2.4 2.6 2.6 2.6   Grade -- 0 -- -- --   Minutes -- 15 15 15 15    METs -- 2.84 2.99 2.99 2.99     NuStep   Level -- 3  T6 nustep 3  T6 3  T6 3  T6   Minutes -- 15 15 15 15    METs -- 4.5 4.1 3.2 3.2     REL-XR   Level -- 3 3 3 3    Minutes -- 15 15 15 15    METs --  2.4 4.8 4.3 4.3     T5 Nustep   Level -- -- 3 3 3    Minutes -- -- 15 15 15    METs -- -- 3.5 4 4      Home Exercise Plan   Plans to continue exercise at -- -- -- -- [employer] (comment)  [employer] and walking at home   Frequency -- -- -- -- Add 2 additional days to program exercise sessions.   Initial Home Exercises Provided -- -- -- -- 06/08/24     Oxygen   Maintain Oxygen Saturation -- 88% or higher 88% or higher 88% or higher 88% or higher    Row Name 06/17/24 1500 07/01/24 0900 07/14/24 1500         Response to Exercise   Blood Pressure (Admit) 132/78 116/64 102/60     Blood Pressure (Exit) 104/62 118/70 118/68     Heart Rate (Admit) 69 bpm 63 bpm 57 bpm     Heart Rate (Exercise) 119 bpm 93 bpm 97 bpm     Heart Rate (Exit) 78 bpm 66 bpm 86 bpm     Rating of Perceived Exertion (Exercise) 15 14 15      Perceived Dyspnea (Exercise) 0 -- --     Symptoms none none none     Duration Progress to 30 minutes of  aerobic without signs/symptoms of physical distress Continue with 30 min of aerobic exercise without signs/symptoms of physical distress. Continue with 30 min of aerobic exercise without signs/symptoms of physical distress.     Intensity THRR unchanged THRR unchanged THRR unchanged       Progression  Progression Continue to progress workloads to maintain intensity without signs/symptoms of physical distress. Continue to progress workloads to maintain intensity without signs/symptoms of physical distress. Continue to progress workloads to maintain intensity without signs/symptoms of physical distress.     Average METs 3.9 3.53 3.18       Resistance Training   Training Prescription Yes Yes --     Weight 4 lb 4 lb 4 lb     Reps 10-15 10-15 10-15       Interval Training   Interval Training No No No       Treadmill   MPH 3.1 2.5 2.7     Grade 0 0 0     Minutes 15 15 15      METs 3.37 2.91 3.07       NuStep   Level 3  T6 -- 4  T6     Minutes 15 -- 15     METs 6.6 --  3.5       REL-XR   Level 3 3 3      Minutes 15 15 15      METs 5.7 4.6 --       T5 Nustep   Level 3 3 3      Minutes 15 15 15      METs 4 4 3.5       Home Exercise Plan   Plans to continue exercise at [employer] (comment)  [employer] and walking at home [employer] (comment)  [employer] and walking at home [employer] (comment)  [employer] and walking at home     Frequency Add 2 additional days to program exercise sessions. Add 2 additional days to program exercise sessions. Add 2 additional days to program exercise sessions.     Initial Home Exercises Provided 06/08/24 06/08/24 06/08/24       Oxygen   Maintain Oxygen Saturation 88% or higher 88% or higher 88% or higher        Exercise Comments:   Exercise Comments     Row Name 04/29/24 0951           Exercise Comments First full day of exercise!  Patient was oriented to gym and equipment including functions, settings, policies, and procedures.  Patient's individual exercise prescription and treatment plan were reviewed.  All starting workloads were established based on the results of the 6 minute walk test done at initial orientation visit.  The plan for exercise progression was also introduced and progression will be customized based on patient's performance and goals.          Exercise Goals and Review:   Exercise Goals     Row Name 04/28/24 0946             Exercise Goals   Increase Physical Activity Yes       Intervention Provide advice, education, support and counseling about physical activity/exercise needs.;Develop an individualized exercise prescription for aerobic and resistive training based on initial evaluation findings, risk stratification, comorbidities and participant's personal goals.       Expected Outcomes Short Term: Attend rehab on a regular basis to increase amount of physical activity.;Long Term: Add in home exercise to make exercise part of routine and to increase amount of physical  activity.;Long Term: Exercising regularly at least 3-5 days a week.       Increase Strength and Stamina Yes       Intervention Provide advice, education, support and counseling about physical activity/exercise needs.;Develop an individualized exercise prescription for aerobic and  resistive training based on initial evaluation findings, risk stratification, comorbidities and participant's personal goals.       Expected Outcomes Short Term: Increase workloads from initial exercise prescription for resistance, speed, and METs.;Short Term: Perform resistance training exercises routinely during rehab and add in resistance training at home;Long Term: Improve cardiorespiratory fitness, muscular endurance and strength as measured by increased METs and functional capacity ( )       Able to understand and use rate of perceived exertion (RPE) scale Yes       Intervention Provide education and explanation on how to use RPE scale       Expected Outcomes Short Term: Able to use RPE daily in rehab to express subjective intensity level;Long Term:  Able to use RPE to guide intensity level when exercising independently       Able to understand and use Dyspnea scale Yes       Intervention Provide education and explanation on how to use Dyspnea scale       Expected Outcomes Long Term: Able to use Dyspnea scale to guide intensity level when exercising independently;Short Term: Able to use Dyspnea scale daily in rehab to express subjective sense of shortness of breath during exertion       Knowledge and understanding of Target Heart Rate Range (THRR) Yes       Intervention Provide education and explanation of THRR including how the numbers were predicted and where they are located for reference       Expected Outcomes Short Term: Able to state/look up THRR;Long Term: Able to use THRR to govern intensity when exercising independently;Short Term: Able to use daily as guideline for intensity in rehab       Able to check  pulse independently Yes       Intervention Provide education and demonstration on how to check pulse in carotid and radial arteries.;Review the importance of being able to check your own pulse for safety during independent exercise       Expected Outcomes Short Term: Able to explain why pulse checking is important during independent exercise;Long Term: Able to check pulse independently and accurately       Understanding of Exercise Prescription Yes       Intervention Provide education, explanation, and written materials on patient's individual exercise prescription       Expected Outcomes Short Term: Able to explain program exercise prescription;Long Term: Able to explain home exercise prescription to exercise independently          Exercise Goals Re-Evaluation :  Exercise Goals Re-Evaluation     Row Name 04/29/24 0951 05/07/24 1624 05/20/24 0800 06/03/24 1554 06/08/24 0936     Exercise Goal Re-Evaluation   Exercise Goals Review Increase Physical Activity;Able to understand and use rate of perceived exertion (RPE) scale;Knowledge and understanding of Target Heart Rate Range (THRR);Understanding of Exercise Prescription;Increase Strength and Stamina;Able to understand and use Dyspnea scale;Able to check pulse independently Increase Physical Activity;Understanding of Exercise Prescription;Increase Strength and Stamina Increase Physical Activity;Understanding of Exercise Prescription;Increase Strength and Stamina Increase Physical Activity;Understanding of Exercise Prescription;Increase Strength and Stamina Increase Physical Activity;Able to understand and use rate of perceived exertion (RPE) scale;Knowledge and understanding of Target Heart Rate Range (THRR);Understanding of Exercise Prescription;Increase Strength and Stamina;Able to understand and use Dyspnea scale;Able to check pulse independently   Comments Reviewed RPE and dyspnea scale, THR and program prescription with pt today.  Pt voiced  understanding and was given a copy of goals to take home. Shaun Anderson is off  to a good start in the program and completed his first week in this review. He worked at level 3 on the XR and T6 nustep. He had a workload on the treadmill of a speed of 2.4 mph with no incline. We will continue to monitor his progress in the program. Shaun Anderson is doing well in rehab. He has recently been able to increase his speed on the treadmill from 2.4mph to 2.67mph. He was also able to increase from level 2 to 3 on the T5 nustep. We will continue to monitor his progress in the program. Shaun Anderson is doing well in rehab. He has maintained level 3 on the T6 and T5 nusteps and XR. He also maintained his speed on the treadmill to 2.6 mph with no incline. We will continue to monitor his progress in the program. Reviewed home exercise with pt today.  Pt plans to walk and look into joining the [facility] for exercise.  Reviewed THR, pulse, RPE, sign and symptoms, pulse oximetery and when to call 911 or MD.  Also discussed weather considerations and indoor options.  Pt voiced understanding.   Expected Outcomes Short: Use RPE daily to regulate intensity. Long: Follow program prescription in THR. Short: Continue to follow current exercise prescription. Long: Continue exercise to improve strength and stamina. Short: Continue to increase treadmill workloads. Long: Continue exercise to improve strength and stamina. Short: Try levle 4 on the nustep and XR. Long: Continue exercise to improve strength and stamina. Short: add 1-2 days a week of exercise on off days for cardiac rehab and look into joining the [facility]. Long: maintain independent exercise routine upon graduation from cardiac rehab.    Row Name 06/17/24 1550 06/29/24 0924 07/01/24 0957 07/14/24 1506       Exercise Goal Re-Evaluation   Exercise Goals Review Increase Physical Activity;Increase Strength and Stamina;Understanding of Exercise Prescription Increase Physical Activity;Increase Strength and  Stamina;Improve claudication pain tolerance and improve walking ability Increase Physical Activity;Increase Strength and Stamina;Understanding of Exercise Prescription Increase Physical Activity;Increase Strength and Stamina;Understanding of Exercise Prescription    Comments Shaun Anderson continues to make improvements in rehab. He was able to increase his speed on the treadmill from 2.6mph to 3.22mph. He was also able to maintain level 3 on the T6 nustep and level 3 on the XR. We will continue to monitor his progress in the program. Shaun Anderson is exercising at home or at the [facility] 1-2 days a week on off days of rehab. [facility] at home 1 extra time a week at home. He reports that he is tolerating his independent exercise well. Shaun Anderson only attended two sessions of rehab during the last review period. He decreased his speed on the treadmill from a speed of 3.1 mph to 2.5 mph with no incline. He also continues to work at level 3 on both the T5 nustep and XR. We will continue to monitor his progress in the program. Shaun Anderson attended three sessions during the last review period. He was able to increase is treadmill workload back up to 2.7 mph with no incline. He also continues to work at level 3 on the XR and improved to level 4 on the T6 nustep. We will continue to monitor his progress in the program.    Expected Outcomes Short: Continue to increase treadmill workload. Long: Continue exercise to improve strength and stamina. Short: continue to exercise on off days of rehab. Long: maintian independent exerise routine upon graduation from cardaic  rehab. Short: Attend rehab more consistently. Long: Continue exercise to improve strength and stamina. Short: Continue to progressively increase treadmill workload. Long: Continue exercise to improve strength and stamina.       Discharge Exercise Prescription (Final Exercise Prescription Changes):  Exercise Prescription Changes -  07/14/24 1500       Response to Exercise   Blood Pressure (Admit) 102/60    Blood Pressure (Exit) 118/68    Heart Rate (Admit) 57 bpm    Heart Rate (Exercise) 97 bpm    Heart Rate (Exit) 86 bpm    Rating of Perceived Exertion (Exercise) 15    Symptoms none    Duration Continue with 30 min of aerobic exercise without signs/symptoms of physical distress.    Intensity THRR unchanged      Progression   Progression Continue to progress workloads to maintain intensity without signs/symptoms of physical distress.    Average METs 3.18      Resistance Training   Weight 4 lb    Reps 10-15      Interval Training   Interval Training No      Treadmill   MPH 2.7    Grade 0    Minutes 15    METs 3.07      NuStep   Level 4   T6   Minutes 15    METs 3.5      REL-XR   Level 3    Minutes 15      T5 Nustep   Level 3    Minutes 15    METs 3.5      Home Exercise Plan   Plans to continue exercise at [employer] (comment)   [employer] and walking at home   Frequency Add 2 additional days to program exercise sessions.    Initial Home Exercises Provided 06/08/24      Oxygen   Maintain Oxygen Saturation 88% or higher          Nutrition:  Target Goals: Understanding of nutrition guidelines, daily intake of sodium 1500mg , cholesterol 200mg , calories 30% from fat and 7% or less from saturated fats, daily to have 5 or more servings of fruits and vegetables.  Education: Nutrition 1 -Group instruction provided by verbal, written material, interactive activities, discussions, models, and posters to present general guidelines for heart healthy nutrition including macronutrients, label reading, and promoting whole foods over processed counterparts. Education serves as pensions consultant of discussion of heart healthy eating for all. Written material provided at class time.    Education: Nutrition 2 -Group instruction provided by verbal, written material, interactive activities, discussions,  models, and posters to present general guidelines for heart healthy nutrition including sodium, cholesterol, and saturated fat. Providing guidance of habit forming to improve blood pressure, cholesterol, and body weight. Written material provided at class time.     Biometrics:  Pre Biometrics - 04/28/24 0947       Pre Biometrics   Height 5' 8 (1.727 m)    Weight 254 lb 8 oz (115.4 kg)    Waist Circumference 48 inches    Hip Circumference 45.5 inches    Waist to Hip Ratio 1.05 %    BMI (Calculated) 38.71    Single Leg Stand 2.13 seconds           Nutrition Therapy Plan and Nutrition Goals:  Nutrition Therapy & Goals - 04/29/24 1516       Nutrition Therapy   Diet Cardiac, Low Na    Protein (specify units) 90  Fiber 30 grams    Whole Grain Foods 3 servings    Saturated Fats 15 max. grams    Fruits and Vegetables 5 servings/day    Sodium 2 grams      Personal Nutrition Goals   Nutrition Goal Read labels and reduce sodium intake to below 2300mg . Ideally 1500mg  per day.    Personal Goal #2 Reduce saturated fat, less than 12g per day. Replace bad fats for more heart healthy fats.    Personal Goal #3 Include more colorful produce, aim for 5-8 servings of fruits and veggies per day    Comments Shaun Anderson reports he cut out soda four or five months ago when he found out he might need stents. He said he was trying to be healthier. Commended him on this change. Educated on how cutting back on soda is overall healthy but will have the biggest impact on his A1C, which was measured at 6.0% 1 month ago. Spoke with him about heart health and how saturated fats, sodium and cholesterol were the nutrients to monitor for heart health. Reviewed his food recall and discussed the high saturated fat, sodium and cholesterol in these foods he eats often. He admits it will be a lot to change to be more heart healthy. Encouraged him to work on it and focus on improving and not meeting perfect standards  before the program ends. Provided guideline limits to work towards of less than 12g saturated fat, less than 1500mg  sodium, and less than 250mg  cholesterol. Reviewed mediterranean diet handout, educated on types of fats, sources, and how to read labels.      Intervention Plan   Intervention Prescribe, educate and counsel regarding individualized specific dietary modifications aiming towards targeted core components such as weight, hypertension, lipid management, diabetes, heart failure and other comorbidities.;Nutrition handout(s) given to patient.    Expected Outcomes Short Term Goal: Understand basic principles of dietary content, such as calories, fat, sodium, cholesterol and nutrients.;Short Term Goal: A plan has been developed with personal nutrition goals set during dietitian appointment.;Long Term Goal: Adherence to prescribed nutrition plan.          Nutrition Assessments:  MEDIFICTS Score Key: >=70 Need to make dietary changes  40-70 Heart Healthy Diet <= 40 Therapeutic Level Cholesterol Diet  Flowsheet Row Cardiac Rehab from 05/04/2024 in [facility] Cardiac and Pulmonary Rehab  Picture Your Plate Total Score on Admission 36   Picture Your Plate Scores: <59 Unhealthy dietary pattern with much room for improvement. 41-50 Dietary pattern unlikely to meet recommendations for good health and room for improvement. 51-60 More healthful dietary pattern, with some room for improvement.  >60 Healthy dietary pattern, although there may be some specific behaviors that could be improved.    Nutrition Goals Re-Evaluation:  Nutrition Goals Re-Evaluation     Row Name 05/20/24 0958 06/29/24 0928           Goals   Current Weight 243 lb (110.2 kg) --      Nutrition Goal -- Read labels and reduce sodium intake to below 2300mg . Ideally 1500mg  per day.      Comment Shaun Anderson has been cutting back on sweets and does not snack after eight pm. He states he used to drink alot of chocolate milk and has  then cut back . He is ready to get lose some weight and is going to cut back a little more and make healthier food choices. Shaun Anderson continues to cut back on his chocolate milk. He sometimes goes days without  any. He does drink at least a glass of milk a day. He report that he has gotten comfortable with reading food labels and reducing sodium and saturated fat. He has also been eating more fresh produce with no salt added.      Expected Outcome Short: make healthier food choices. Long: lose some weight. Short: continue to read food labels. Long: maintain heart healthy diet long term.        Personal Goal #2 Re-Evaluation   Personal Goal #2 -- Reduce saturated fat, less than 12g per day. Replace bad fats for more heart healthy fats.        Personal Goal #3 Re-Evaluation   Personal Goal #3 -- Include more colorful produce, aim for 5-8 servings of fruits and veggies per day         Nutrition Goals Discharge (Final Nutrition Goals Re-Evaluation):  Nutrition Goals Re-Evaluation - 06/29/24 9071       Goals   Nutrition Goal Read labels and reduce sodium intake to below 2300mg . Ideally 1500mg  per day.    Comment Shaun Anderson continues to cut back on his chocolate milk. He sometimes goes days without any. He does drink at least a glass of milk a day. He report that he has gotten comfortable with reading food labels and reducing sodium and saturated fat. He has also been eating more fresh produce with no salt added.    Expected Outcome Short: continue to read food labels. Long: maintain heart healthy diet long term.      Personal Goal #2 Re-Evaluation   Personal Goal #2 Reduce saturated fat, less than 12g per day. Replace bad fats for more heart healthy fats.      Personal Goal #3 Re-Evaluation   Personal Goal #3 Include more colorful produce, aim for 5-8 servings of fruits and veggies per day          Psychosocial: Target Goals: Acknowledge presence or absence of significant depression and/or  stress, maximize coping skills, provide positive support system. Participant is able to verbalize types and ability to use techniques and skills needed for reducing stress and depression.   Education: Stress, Anxiety, and Depression - Group verbal and visual presentation to define topics covered.  Reviews how body is impacted by stress, anxiety, and depression.  Also discusses healthy ways to reduce stress and to treat/manage anxiety and depression. Written material provided at class time. Flowsheet Row Cardiac Rehab from 08/27/2016 in [facility] Cardiac [facility] Pul[facility] Rehab  Date 08/01/16  Educator [facility]  Instruction Review Code (retired) 2- meets goals/outcomes    Education: Sleep Hygiene -Provides group verbal and written instruction about how sleep can affect your health.  Define sleep hygiene, discuss sleep cycles and impact of sleep habits. Review good sleep hygiene tips.   Initial Review & Psychosocial Screening:  Initial Psych Review & Screening - 04/23/24 1449       Initial Review   Current issues with None Identified      Family Dynamics   Good Support System? 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 He is now home with his wife and states he is feeling well. He reports that he has had decreased appetite and changes in the taste of food, but attributes this to medication changes, recent intubation, and the fact that he stopped smoking 5 months ago. Shaun Anderson reports that he is ready to get back to his active lifestyle. He does admit to  episodes of feeling down or increased stress, however states that his wife and son offer a good support system and he enjoys walking his dog when he starts to feel this way. He is looking forward to starting the program again and verbalized understanding of the need to be vocal about how he was feeling and if anything was causing discomfort, specifically at his incision.      Barriers    Psychosocial barriers to participate in program The patient should benefit from training in stress management and relaxation.      Screening Interventions   Interventions Encouraged to exercise;To provide support and resources with identified psychosocial needs    Expected Outcomes Short Term goal: Utilizing psychosocial counselor, staff and physician to assist with identification of specific Stressors or current issues interfering with healing process. Setting desired goal for each stressor or current issue identified.;Long Term Goal: Stressors or current issues are controlled or eliminated.;Short Term goal: Identification and review with participant of any Quality of Life or Depression concerns found by scoring the questionnaire.;Long Term goal: The participant improves quality of Life and PHQ9 Scores as seen by post scores and/or verbalization of changes          Quality of Life Scores:   Quality of Life - 05/04/24 1350       Quality of Life   Select Quality of Life      Quality of Life Scores   Health/Function Pre 26 %    Socioeconomic Pre 24.64 %    Psych/Spiritual Pre 26.57 %    Family Pre 30 %    GLOBAL Pre 26.43 %         Scores of 19 and below usually indicate a poorer quality of life in these areas.  A difference of  2-3 points is a clinically meaningful difference.  A difference of 2-3 points in the total score of the Quality of Life Index has been associated with significant improvement in overall quality of life, self-image, physical symptoms, and general health in studies assessing change in quality of life.  PHQ-9: Review Flowsheet       06/29/2024 05/20/2024 04/28/2024 07/30/2016  Depression screen PHQ 2/9  Decreased Interest 0 2 1 0  Down, Depressed, Hopeless 0 2 1 0  PHQ - 2 Score 0 4 2 0  Altered sleeping 1 1 2 1   Tired, decreased energy 1 1 1 1   Change in appetite 0 2 1 1   Feeling bad or failure about yourself  0 0 0 0  Trouble concentrating 1 0 0 0  Moving  slowly or fidgety/restless 0 0 0 0  Suicidal thoughts 0 0 0 0   PHQ-9 Score 3 8  6  3    Difficult doing work/chores Not difficult at all Somewhat difficult Not difficult at all Not difficult at all    Details       Data saved with a previous flowsheet row definition        Interpretation of Total Score  Total Score Depression Severity:  1-4 = Minimal depression, 5-9 = Mild depression, 10-14 = Moderate depression, 15-19 = Moderately severe depression, 20-27 = Severe depression   Psychosocial Evaluation and Intervention:  Psychosocial Evaluation - 04/23/24 1452       Psychosocial Evaluation & Interventions   Interventions Encouraged to exercise with the program and follow exercise prescription;Relaxation education;Stress management education    Comments Shaun Anderson 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 He is now home  with his wife and states he is feeling well. He reports that he has had decreased appetite and changes in the taste of food, but attributes this to medication changes, recent intubation, and the fact that he stopped smoking 5 months ago. Shaun Anderson reports that he is ready to get back to his active lifestyle. He does admit to episodes of feeling down or increased stress, however states that his wife and son offer a good support system and he enjoys walking his dog when he starts to feel this way. He is looking forward to starting the program again and verbalized understanding of the need to be vocal about how he was feeling and if anything was causing discomfort, specifically at his incision.    Expected Outcomes Short: Attend cardiac rehab for education and exercise. Long: Develop and maintain positive self care habits.    Continue Psychosocial Services  Follow up required by staff          Psychosocial Re-Evaluation:  Psychosocial Re-Evaluation     Row Name 05/20/24 1005 06/29/24 9061           Psychosocial Re-Evaluation   Current  issues with Current Stress Concerns None Identified      Comments Reviewed patient health questionnaire (PHQ-9) with patient for follow up. Previously, patients score indicated signs/symptoms of depression.  Reviewed to see if patient is improving symptom wise while in program.  Score declined and patient states that it is because he has had some stressors at home and is not able to do what he wants to do yet. Yanis reports that he does not have any sleep, stress, or mental health concerns. He reports he has been feeling physically better and more like himself. His PHQ was re-evaluated and his score went from an 8 to a 3, showing improvment.      Expected Outcomes Short: Continue to work toward an improvement in PHQ9 scores by attending [facility] regularly. Long: Continue to improve stress and depression coping skills by talking with staff and attending [facility] regularly and work toward a positive mental state. Short: continue to attend cardiac rehab for the mental health benefits of exercise. Long: maintain good mental health routine.      Interventions Encouraged to attend Cardiac Rehabilitation for the exercise --      Continue Psychosocial Services  Follow up required by staff Follow up required by staff         Psychosocial Discharge (Final Psychosocial Re-Evaluation):  Psychosocial Re-Evaluation - 06/29/24 9061       Psychosocial Re-Evaluation   Current issues with None Identified    Comments Shaun Anderson reports that he does not have any sleep, stress, or mental health concerns. He reports he has been feeling physically better and more like himself. His PHQ was re-evaluated and his score went from an 8 to a 3, showing improvment.    Expected Outcomes Short: continue to attend cardiac rehab for the mental health benefits of exercise. Long: maintain good mental health routine.    Continue Psychosocial Services  Follow up required by staff          Vocational Rehabilitation: Provide  vocational rehab assistance to qualifying candidates.   Vocational Rehab Evaluation & Intervention:  Vocational Rehab - 04/23/24 1449       Initial Vocational Rehab Evaluation & Intervention   Assessment shows need for Vocational Rehabilitation No   patient is retired         Education: Education Goals: Education classes will be provided on  a variety of topics geared toward better understanding of heart health and risk factor modification. Participant will state understanding/return demonstration of topics presented as noted by education test scores.  Learning Barriers/Preferences:  Learning Barriers/Preferences - 04/23/24 1449       Learning Barriers/Preferences   Learning Barriers None    Learning Preferences None          General Cardiac Education Topics:  AED/CPR: - Group verbal and written instruction with the use of models to demonstrate the basic use of the AED with the basic ABC's of resuscitation.   Test and Procedures: - Group verbal and visual presentation and models provide information about basic cardiac anatomy and function. Reviews the testing methods done to diagnose heart disease and the outcomes of the test results. Describes the treatment choices: Medical Management, Angioplasty, or Coronary Bypass Surgery for treating various heart conditions including Myocardial Infarction, Angina, Valve Disease, and Cardiac Arrhythmias. Written material provided at class time. Flowsheet Row Cardiac Rehab from 08/27/2016 in [facility] Cardiac and Pulmonary Rehab  Date 08/27/16  Educator MA  Instruction Review Code (retired) 2- meets goals/outcomes    Medication Safety: - Group verbal and visual instruction to review commonly prescribed medications for heart and lung disease. Reviews the medication, class of the drug, and side effects. Includes the steps to properly store meds and maintain the prescription regimen. Written material provided at class time.   Intimacy: - Group  verbal instruction through game format to discuss how heart and lung disease can affect sexual intimacy. Written material provided at class time.   Know Your Numbers and Heart Failure: - Group verbal and visual instruction to discuss disease risk factors for cardiac and pulmonary disease and treatment options.  Reviews associated critical values for Overweight/Obesity, Hypertension, Cholesterol, and Diabetes.  Discusses basics of heart failure: signs/symptoms and treatments.  Introduces Heart Failure Zone chart for action plan for heart failure. Written material provided at class time. Flowsheet Row Cardiac Rehab from 08/27/2016 in [facility] Cardiac and Pulmonary Rehab  Date 08/27/16  Educator MA  Instruction Review Code (retired) 2- meets goals/outcomes    Infection Prevention: - Provides verbal and written material to individual with discussion of infection control including proper hand washing and proper equipment cleaning during exercise session. Flowsheet Row Cardiac Rehab from 06/10/2024 in [facility] Cardiac and Pulmonary Rehab  Date 04/28/24  Educator NT  Instruction Review Code 1- Verbalizes Understanding    Falls Prevention: - Provides verbal and written material to individual with discussion of falls prevention and safety. Flowsheet Row Cardiac Rehab from 06/10/2024 in [facility] Cardiac and Pulmonary Rehab  Date 04/28/24  Educator NT  Instruction Review Code 1- Verbalizes Understanding    Other: -Provides group and verbal instruction on various topics (see comments)   Knowledge Questionnaire Score:  Knowledge Questionnaire Score - 05/04/24 1351       Knowledge Questionnaire Score   Pre Score 23/26          Core Components/Risk Factors/Patient Goals at Admission:  Personal Goals and Risk Factors at Admission - 04/23/24 1447       Core Components/Risk Factors/Patient Goals on Admission    Weight Management Yes;Weight Loss    Intervention Weight Management: Develop a combined  nutrition and exercise program designed to reach desired caloric intake, while maintaining appropriate intake of nutrient and fiber, sodium and fats, and appropriate energy expenditure required for the weight goal.;Weight Management: Provide education and appropriate resources to help participant work on and attain dietary goals.;Weight Management/Obesity: Establish  reasonable short term and long term weight goals.;Obesity: Provide education and appropriate resources to help participant work on and attain dietary goals.    Goal Weight: Long Term 225 lb (102.1 kg)    Expected Outcomes Short Term: Continue to assess and modify interventions until short term weight is achieved;Weight Loss: Understanding of general recommendations for a balanced deficit meal plan, which promotes 1-2 lb weight loss per week and includes a negative energy balance of [phone] kcal/d;Long Term: Adherence to nutrition and physical activity/exercise program aimed toward attainment of established weight goal;Understanding recommendations for meals to include 15-35% energy as protein, 25-35% energy from fat, 35-60% energy from carbohydrates, less than 200mg  of dietary cholesterol, 20-35 gm of total fiber daily;Understanding of distribution of calorie intake throughout the day with the consumption of 4-5 meals/snacks    Hypertension Yes    Intervention Provide education on lifestyle modifcations including regular physical activity/exercise, weight management, moderate sodium restriction and increased consumption of fresh fruit, vegetables, and low fat dairy, alcohol moderation, and smoking cessation.;Monitor prescription use compliance.    Expected Outcomes Short Term: Continued assessment and intervention until BP is < 140/38mm HG in hypertensive participants. < 130/52mm HG in hypertensive participants with diabetes, heart failure or chronic kidney disease.;Long Term: Maintenance of blood pressure at goal levels.    Lipids Yes     Intervention Provide education and support for participant on nutrition & aerobic/resistive exercise along with prescribed medications to achieve LDL 70mg , HDL >40mg .    Expected Outcomes Short Term: Participant states understanding of desired cholesterol values and is compliant with medications prescribed. Participant is following exercise prescription and nutrition guidelines.;Long Term: Cholesterol controlled with medications as prescribed, with individualized exercise RX and with personalized nutrition plan. Value goals: LDL < 70mg , HDL > 40 mg.          Education:Diabetes - Individual verbal and written instruction to review signs/symptoms of diabetes, desired ranges of glucose level fasting, after meals and with exercise. Acknowledge that pre and post exercise glucose checks will be done for 3 sessions at entry of program.   Core Components/Risk Factors/Patient Goals Review:   Goals and Risk Factor Review     Row Name 05/20/24 9047 06/29/24 0931           Core Components/Risk Factors/Patient Goals Review   Personal Goals Review Weight Management/Obesity Weight Management/Obesity;Lipids;Hypertension      Review Shaun Anderson wants to reach a weight goal of 225 pounds. His current current weight is at 243 pounds. He is cutting back on sweets and will monitor his weight at home. Shaun Anderson states that he does weigh most days and he is working with his doctor to manage his fluid levels. His weight changes have mostly been due to fluid but he has been working closely with his doctor to stablize this. He checks blood pressure at home and reports that he takes all BP and cholesterol meds. He follows up regularly for lab work with his doctor to monitor his cholesterol.      Expected Outcomes Short: lose a few pounds in the next couple weeks. Long: reach weight goal. Short: get fluid levles and weight more stablized. Long: control cardiac risk factors.         Core Components/Risk Factors/Patient  Goals at Discharge (Final Review):   Goals and Risk Factor Review - 06/29/24 0931       Core Components/Risk Factors/Patient Goals Review   Personal Goals Review Weight Management/Obesity;Lipids;Hypertension    Review Shaun Anderson states that he does  weigh most days and he is working with his doctor to manage his fluid levels. His weight changes have mostly been due to fluid but he has been working closely with his doctor to stablize this. He checks blood pressure at home and reports that he takes all BP and cholesterol meds. He follows up regularly for lab work with his doctor to monitor his cholesterol.    Expected Outcomes Short: get fluid levles and weight more stablized. Long: control cardiac risk factors.          ITP Comments:  ITP Comments     Row Name 04/23/24 1444 04/28/24 0943 04/29/24 0951 05/13/24 0937 06/10/24 1116   ITP Comments Initial phone call completed. Diagnosis can be found in [facility] 03/16/2024. EP Orientation scheduled for Tuesday 10/7 at 8:00. Completed and gym orientation for cardiac rehab. Initial ITP created and sent for review to Dr. Oneil Pinal, Medical Director. First full day of exercise!  Patient was oriented to gym and equipment including functions, settings, policies, and procedures.  Patient's individual exercise prescription and treatment plan were reviewed.  All starting workloads were established based on the results of the 6 minute walk test done at initial orientation visit.  The plan for exercise progression was also introduced and progression will be customized based on patient's performance and goals. 30 Day review completed. Medical Director ITP review done, changes made as directed, and signed approval by Medical Director. 30 Day review completed. Medical Director ITP review done, changes made as directed, and signed approval by Medical Director.    Row Name 07/08/24 1313 08/04/24 0853         ITP Comments 30 Day review completed. Medical Director ITP  review done, changes made as directed, and signed approval by Medical Director. Early discharge due to lack on attendance         Comments: early discharge    [1]  Current Outpatient Medications:    allopurinol (ZYLOPRIM) 100 MG tablet, Take 100 mg by mouth daily., Disp: , Rfl:    amiodarone (PACERONE) 200 MG tablet, Take 200 mg by mouth daily., Disp: , Rfl:    amLODipine (NORVASC) 2.5 MG tablet, Take 2.5 mg by mouth., Disp: , Rfl:    apixaban  (ELIQUIS ) 5 MG TABS tablet, Take 5 mg by mouth 2 (two) times daily., Disp: , Rfl:    aspirin  EC 81 MG tablet, Take 81 mg by mouth daily., Disp: , Rfl:    atorvastatin  (LIPITOR) 80 MG tablet, Take 1 tablet (80 mg total) by mouth daily., Disp: 90 tablet, Rfl: 4   Cholecalciferol 50 MCG (2000 UT) CAPS, Take 2,000 Units by mouth daily., Disp: , Rfl:    empagliflozin (JARDIANCE) 10 MG TABS tablet, Take 10 mg by mouth daily., Disp: , Rfl:    ezetimibe  (ZETIA ) 10 MG tablet, Take 1 tablet (10 mg total) by mouth daily., Disp: 30 tablet, Rfl: 2   fluticasone (FLONASE) 50 MCG/ACT nasal spray, Place 1 spray into the nose daily., Disp: , Rfl:    fluticasone-salmeterol (ADVAIR) 250-50 MCG/ACT AEPB, Inhale 1 puff into the lungs 2 (two) times daily., Disp: , Rfl:    furosemide (LASIX) 40 MG tablet, Take 40 mg by mouth daily., Disp: , Rfl:    ipratropium (ATROVENT) 0.03 % nasal spray, Place 1 spray into the nose daily as needed for rhinitis., Disp: , Rfl:    metoprolol  tartrate (LOPRESSOR ) 25 MG tablet, Take 12.5 mg by mouth 2 (two) times daily., Disp: , Rfl:  NON FORMULARY, Pt uses a cpap nightly, Disp: , Rfl:  [2]  Social History Tobacco Use  Smoking Status Former   Current packs/day: 1.00   Average packs/day: 1 pack/day for 24.0 years (24.0 ttl pk-yrs)   Types: Cigarettes  Smokeless Tobacco Never
